# Patient Record
Sex: Male | Born: 1960 | Race: Black or African American | Hispanic: No | Marital: Married | Smoking: Never smoker
Health system: Southern US, Community
[De-identification: ages and names within clinical notes are randomized; demographics above are authoritative.]

## PROBLEM LIST (undated history)

## (undated) DIAGNOSIS — H53002 Unspecified amblyopia, left eye: Secondary | ICD-10-CM

## (undated) DIAGNOSIS — M1990 Unspecified osteoarthritis, unspecified site: Secondary | ICD-10-CM

## (undated) DIAGNOSIS — I10 Essential (primary) hypertension: Secondary | ICD-10-CM

## (undated) DIAGNOSIS — K219 Gastro-esophageal reflux disease without esophagitis: Secondary | ICD-10-CM

## (undated) DIAGNOSIS — E785 Hyperlipidemia, unspecified: Secondary | ICD-10-CM

## (undated) DIAGNOSIS — E119 Type 2 diabetes mellitus without complications: Secondary | ICD-10-CM

## (undated) HISTORY — DX: Type 2 diabetes mellitus without complications: E11.9

## (undated) HISTORY — DX: Gastro-esophageal reflux disease without esophagitis: K21.9

## (undated) HISTORY — DX: Essential (primary) hypertension: I10

## (undated) HISTORY — DX: Hyperlipidemia, unspecified: E78.5

---

## 2006-02-01 DIAGNOSIS — J309 Allergic rhinitis, unspecified: Secondary | ICD-10-CM | POA: Insufficient documentation

## 2007-01-13 ENCOUNTER — Ambulatory Visit: Payer: Self-pay | Admitting: Family Medicine

## 2008-08-22 ENCOUNTER — Emergency Department: Payer: Self-pay | Admitting: Emergency Medicine

## 2013-03-31 DIAGNOSIS — Z8614 Personal history of Methicillin resistant Staphylococcus aureus infection: Secondary | ICD-10-CM | POA: Insufficient documentation

## 2014-04-22 LAB — PSA: PSA: 1.4

## 2014-05-20 ENCOUNTER — Ambulatory Visit: Payer: Self-pay | Admitting: Gastroenterology

## 2014-05-24 LAB — HM COLONOSCOPY: HM Colonoscopy: 1

## 2015-04-08 DIAGNOSIS — R1013 Epigastric pain: Secondary | ICD-10-CM | POA: Insufficient documentation

## 2015-04-08 DIAGNOSIS — F411 Generalized anxiety disorder: Secondary | ICD-10-CM | POA: Insufficient documentation

## 2015-04-08 DIAGNOSIS — L739 Follicular disorder, unspecified: Secondary | ICD-10-CM | POA: Insufficient documentation

## 2015-04-08 DIAGNOSIS — K130 Diseases of lips: Secondary | ICD-10-CM | POA: Insufficient documentation

## 2015-04-08 DIAGNOSIS — Z202 Contact with and (suspected) exposure to infections with a predominantly sexual mode of transmission: Secondary | ICD-10-CM | POA: Insufficient documentation

## 2015-04-08 DIAGNOSIS — IMO0002 Reserved for concepts with insufficient information to code with codable children: Secondary | ICD-10-CM | POA: Insufficient documentation

## 2015-04-08 DIAGNOSIS — E1165 Type 2 diabetes mellitus with hyperglycemia: Secondary | ICD-10-CM | POA: Insufficient documentation

## 2015-05-11 DIAGNOSIS — E1165 Type 2 diabetes mellitus with hyperglycemia: Secondary | ICD-10-CM | POA: Insufficient documentation

## 2015-05-12 ENCOUNTER — Ambulatory Visit: Payer: Self-pay | Admitting: Family Medicine

## 2015-06-02 ENCOUNTER — Other Ambulatory Visit: Payer: Self-pay

## 2015-06-02 ENCOUNTER — Telehealth: Payer: Self-pay | Admitting: Gastroenterology

## 2015-06-02 NOTE — Telephone Encounter (Signed)
Pt showed up on recall list, he needs 1 year repeat from polyps found last year 7/2. Pt is now scheduled for 9/[facility] at [facility]

## 2015-06-08 ENCOUNTER — Other Ambulatory Visit: Payer: Self-pay | Admitting: Family Medicine

## 2015-06-09 ENCOUNTER — Ambulatory Visit (INDEPENDENT_AMBULATORY_CARE_PROVIDER_SITE_OTHER): Payer: BLUE CROSS/BLUE SHIELD | Admitting: Family Medicine

## 2015-06-09 ENCOUNTER — Encounter: Payer: Self-pay | Admitting: Family Medicine

## 2015-06-09 ENCOUNTER — Other Ambulatory Visit: Payer: Self-pay | Admitting: Family Medicine

## 2015-06-09 VITALS — BP 128/78 | HR 76 | Temp 97.8°F | Resp 16 | Ht 71.5 in | Wt 212.0 lb

## 2015-06-09 DIAGNOSIS — E785 Hyperlipidemia, unspecified: Secondary | ICD-10-CM | POA: Diagnosis not present

## 2015-06-09 DIAGNOSIS — I10 Essential (primary) hypertension: Secondary | ICD-10-CM | POA: Diagnosis not present

## 2015-06-09 DIAGNOSIS — E119 Type 2 diabetes mellitus without complications: Secondary | ICD-10-CM

## 2015-06-09 LAB — POCT GLYCOSYLATED HEMOGLOBIN (HGB A1C): Hemoglobin A1C: 7.1

## 2015-06-09 MED ORDER — LISINOPRIL 20 MG PO TABS: 20.0000 mg | ORAL_TABLET | Freq: Every day | ORAL | Status: DC

## 2015-06-09 NOTE — Progress Notes (Signed)
Subjective:     Patient ID: Connor [name], male   DOB: 03-22-1961, 54 y.o.   MRN: [id number]  HPI  Chief Complaint  Patient presents with  . Diabetes    patient comes in office today for follow up, last office visit was 03/07/15, advised to continue 3 drug regimen. HgbA1C in house 7.7%  . Hyperlipidemia    follow up visit from 03/07/15 patient advised to continue Atorvastatin and labs will be checked at next office visit  . Hypertension    follow up from 03/07/15, continue current medication b/p in office 128/82  States he is participating in another diabetes clinical trial in [facility]. They obtain his labs regularly and he believes they have him on an experimental drug. Reports he is feeling well but has been taking lisinopril twice daily with his metformin. I have suggested once daily is enough.   Review of Systems  Respiratory: Negative for shortness of breath.   Cardiovascular: Negative for chest pain and palpitations.  Genitourinary:       No nocturia       Objective:   Physical Exam  Constitutional: He appears well-developed and well-nourished. No distress.  Cardiovascular: Normal rate and regular rhythm.   Pulmonary/Chest: Breath sounds normal.  Musculoskeletal: He exhibits no edema (of lower extremities).       Assessment:     1. Type 2 diabetes mellitus without complication-improved - POCT glycosylated hemoglobin (Hb A1C)  2. HLD (hyperlipidemia)-continue atorvastatin  3. Essential hypertension-stable    Plan:    Continue with clinical trial. F/u in 6 months.

## 2015-06-09 NOTE — Patient Instructions (Signed)
Continue with current medications. Please get copy of your labs from your clinical study when available.

## 2015-06-13 ENCOUNTER — Ambulatory Visit: Payer: Self-pay | Admitting: Family Medicine

## 2015-08-25 ENCOUNTER — Other Ambulatory Visit: Payer: Self-pay | Admitting: Family Medicine

## 2015-08-26 ENCOUNTER — Telehealth: Payer: Self-pay

## 2015-08-26 NOTE — Telephone Encounter (Signed)
Gastroenterology Pre-Procedure Review  Request Date: 08/30/15 Requesting Physician: Anola Gurney, PA  PATIENT REVIEW QUESTIONS: The patient responded to the following health history questions as indicated:    1. Are you having any GI issues? no 2. Do you have a personal history of Polyps? yes (repeat 1 year) 3. Do you have a family history of Colon Cancer or Polyps? no 4. Diabetes Mellitus? yes (Type 2) 5. Joint replacements in the past 12 months?no 6. Major health problems in the past 3 months?no 7. Any artificial heart valves, MVP, or defibrillator?no    MEDICATIONS & ALLERGIES:    Patient reports the following regarding taking any anticoagulation/antiplatelet therapy:   Plavix, Coumadin, Eliquis, Xarelto, Lovenox, Pradaxa, Brilinta, or Effient? no Aspirin? no  Patient confirms/reports the following medications:  Current Outpatient Prescriptions  Medication Sig Dispense Refill  . atorvastatin (LIPITOR) 40 MG tablet Take by mouth daily.    Marland Kitchen glipiZIDE (GLUCOTROL XL) 10 MG 24 hr tablet Take by mouth daily.    . hydrochlorothiazide (HYDRODIURIL) 25 MG tablet Take by mouth daily.    Marland Kitchen lisinopril (PRINIVIL,ZESTRIL) 20 MG tablet Take 1 tablet (20 mg total) by mouth daily. 90 tablet 3  . metFORMIN (GLUCOPHAGE) 1000 MG tablet TAKE 1 TABLET TWICE A DAY 180 tablet 0  . omeprazole (PRILOSEC) 40 MG capsule TAKE 1 CAPSULE DAILY 90 capsule 2  . pioglitazone (ACTOS) 15 MG tablet Take by mouth daily.    . sildenafil (VIAGRA) 100 MG tablet Take by mouth as needed.    . tadalafil (CIALIS) 20 MG tablet Take by mouth as needed.     No current facility-administered medications for this visit.    Patient confirms/reports the following allergies:  No Known Allergies  No orders of the defined types were placed in this encounter.    AUTHORIZATION INFORMATION Primary Insurance: 1D#: Group #:  Secondary Insurance: 1D#: Group #:  SCHEDULE INFORMATION: Date: 08/30/15 Time: Location:  [facility]

## 2015-08-30 ENCOUNTER — Ambulatory Visit: Payer: BLUE CROSS/BLUE SHIELD | Admitting: Anesthesiology

## 2015-08-30 ENCOUNTER — Encounter
Admission: RE | Disposition: A | Discharge status: Home / Self Care | Payer: Self-pay | Source: Ambulatory Visit | Attending: Gastroenterology

## 2015-08-30 ENCOUNTER — Ambulatory Visit
Admission: RE | Admit: 2015-08-30 | Discharge: 2015-08-30 | Disposition: A | Discharge status: Home / Self Care | Payer: BLUE CROSS/BLUE SHIELD | Source: Ambulatory Visit | Attending: Gastroenterology | Admitting: Gastroenterology

## 2015-08-30 ENCOUNTER — Encounter: Payer: Self-pay | Admitting: *Deleted

## 2015-08-30 DIAGNOSIS — E119 Type 2 diabetes mellitus without complications: Secondary | ICD-10-CM | POA: Insufficient documentation

## 2015-08-30 DIAGNOSIS — Z79899 Other long term (current) drug therapy: Secondary | ICD-10-CM | POA: Insufficient documentation

## 2015-08-30 DIAGNOSIS — K219 Gastro-esophageal reflux disease without esophagitis: Secondary | ICD-10-CM | POA: Diagnosis not present

## 2015-08-30 DIAGNOSIS — Z1211 Encounter for screening for malignant neoplasm of colon: Secondary | ICD-10-CM | POA: Insufficient documentation

## 2015-08-30 DIAGNOSIS — Z86010 Personal history of colonic polyps: Secondary | ICD-10-CM | POA: Diagnosis not present

## 2015-08-30 DIAGNOSIS — K640 First degree hemorrhoids: Secondary | ICD-10-CM | POA: Diagnosis not present

## 2015-08-30 DIAGNOSIS — Z7984 Long term (current) use of oral hypoglycemic drugs: Secondary | ICD-10-CM | POA: Insufficient documentation

## 2015-08-30 DIAGNOSIS — I10 Essential (primary) hypertension: Secondary | ICD-10-CM | POA: Insufficient documentation

## 2015-08-30 DIAGNOSIS — E785 Hyperlipidemia, unspecified: Secondary | ICD-10-CM | POA: Insufficient documentation

## 2015-08-30 HISTORY — PX: COLONOSCOPY WITH PROPOFOL: SHX5780

## 2015-08-30 LAB — GLUCOSE, CAPILLARY: Glucose-Capillary: 140 mg/dL — ABNORMAL HIGH (ref 65–99)

## 2015-08-30 SURGERY — COLONOSCOPY WITH PROPOFOL
Anesthesia: General

## 2015-08-30 MED ORDER — MIDAZOLAM HCL 2 MG/2ML IJ SOLN
INTRAMUSCULAR | Status: DC | PRN
  Administered 2015-08-30: 1 mg via INTRAVENOUS

## 2015-08-30 MED ORDER — PROPOFOL 500 MG/50ML IV EMUL
INTRAVENOUS | Status: DC | PRN
  Administered 2015-08-30: 100 ug/kg/min via INTRAVENOUS

## 2015-08-30 MED ORDER — SODIUM CHLORIDE 0.9 % IV SOLN
INTRAVENOUS | Status: DC
  Administered 2015-08-30 (×2): via INTRAVENOUS

## 2015-08-30 MED ORDER — FENTANYL CITRATE (PF) 100 MCG/2ML IJ SOLN
INTRAMUSCULAR | Status: DC | PRN
  Administered 2015-08-30: 50 ug via INTRAVENOUS

## 2015-08-30 NOTE — H&P (Signed)
  [facility]  [address], [address] [place], [place] [address] Phone: [phone] Fax : [phone]  Primary Care Physician:  Anola Gurney, [place] Primary Gastroenterologist:  Dr. Servando Snare  Pre-Procedure History & Physical: HPI:  Connor [name] is a 54 y.o. male is here for an colonoscopy.   Past Medical History  Diagnosis Date  . Diabetes mellitus without complication (HCC)   . Hyperlipidemia   . Hypertension   . GERD (gastroesophageal reflux disease)     History reviewed. No pertinent past surgical history.  Prior to Admission medications   Medication Sig Start Date End Date Taking? Authorizing Provider  atorvastatin (LIPITOR) 40 MG tablet Take by mouth daily. 12/08/14   Historical Provider, MD  glipiZIDE (GLUCOTROL XL) 10 MG 24 hr tablet Take by mouth daily. 12/08/14   Historical Provider, MD  hydrochlorothiazide (HYDRODIURIL) 25 MG tablet Take by mouth daily. 12/08/14   Historical Provider, MD  lisinopril (PRINIVIL,ZESTRIL) 20 MG tablet Take 1 tablet (20 mg total) by mouth daily. 06/09/15   Anola Gurney, PA  metFORMIN (GLUCOPHAGE) 1000 MG tablet TAKE 1 TABLET TWICE A DAY 08/25/15   Anola Gurney, PA  omeprazole (PRILOSEC) 40 MG capsule TAKE 1 CAPSULE DAILY 06/09/15   Anola Gurney, PA  pioglitazone (ACTOS) 15 MG tablet Take by mouth daily. 12/08/14   Historical Provider, MD  sildenafil (VIAGRA) 100 MG tablet Take by mouth as needed. 07/12/14   Historical Provider, MD  tadalafil (CIALIS) 20 MG tablet Take by mouth as needed. 12/23/13   Historical Provider, MD    Allergies as of 06/02/2015  . (No Known Allergies)    History reviewed. No pertinent family history.  Social History   Social History  . Marital Status: Married    Spouse Name: N/A  . Number of Children: N/A  . Years of Education: N/A   Occupational History  . Not on file.   Social History Main Topics  . Smoking status: Never Smoker   . Smokeless tobacco: Never Used  . Alcohol Use: No  . Drug  Use: No  . Sexual Activity: Not on file   Other Topics Concern  . Not on file   Social History Narrative    Review of Systems: See HPI, otherwise negative ROS  Physical Exam: BP 146/96 mmHg  Pulse 73  Temp(Src) 96.2 F (35.7 C) (Tympanic)  Resp 18  Ht  (1.854 m)  Wt 213 lb (96.616 kg)  BMI 28.11 kg/m2  SpO2 100% General:   Alert,  pleasant and cooperative in NAD Head:  Normocephalic and atraumatic. Neck:  Supple; no masses or thyromegaly. Lungs:  Clear throughout to auscultation.    Heart:  Regular rate and rhythm. Abdomen:  Soft, nontender and nondistended. Normal bowel sounds, without guarding, and without rebound.   Neurologic:  Alert and  oriented x4;  grossly normal neurologically.  Impression/Plan: Connor [name] is here for an colonoscopy to be performed for history of colon polyps.  Risks, benefits, limitations, and alternatives regarding  colonoscopy have been reviewed with the patient.  Questions have been answered.  All parties agreeable.   Darlina Rumpf, MD  08/30/2015, 8:09 AM

## 2015-08-30 NOTE — Transfer of Care (Signed)
2Immediate Anesthesia Transfer of Care Note  Patient: Connor [name]  Procedure(s) Performed: Procedure(s): COLONOSCOPY WITH PROPOFOL (N/A)  Patient Location: PACU  Anesthesia Type:General  Level of Consciousness: awake, alert  and oriented  Airway & Oxygen Therapy: Patient Spontanous Breathing and Patient connected to nasal cannula oxygen  Post-op Assessment: Report given to RN and Post -op Vital signs reviewed and stable  Post vital signs: Reviewed and stable  Last Vitals:  Filed Vitals:   08/30/15 0837  BP:   Pulse:   Temp: 36.4 C  Resp:     Complications: No apparent anesthesia complications

## 2015-08-30 NOTE — Op Note (Signed)
[facility] Patient Name: Connor [name] Procedure Date: 08/30/2015 7:57 AM MRN: [id number] Account #: [id number] Date of Birth: 1960/11/21 Admit Type: Outpatient Age: 54 Room: [facility] ENDO ROOM 4 Gender: Male Note Status: Finalized Procedure:         Colonoscopy Indications:       High risk colon cancer surveillance: Personal history of                     colonic polyps Providers:         Midge Minium, MD Referring MD:      Demetrios Isaacs. Sherrie Mustache, MD (Referring MD) Medicines:         Propofol per Anesthesia Complications:     No immediate complications. Procedure:         Pre-Anesthesia Assessment:                    - Prior to the procedure, a History and Physical was                     performed, and patient medications and allergies were                     reviewed. The patient's tolerance of previous anesthesia                     was also reviewed. The risks and benefits of the procedure                     and the sedation options and risks were discussed with the                     patient. All questions were answered, and informed consent                     was obtained. Prior Anticoagulants: The patient has taken                     no previous anticoagulant or antiplatelet agents. ASA                     Grade Assessment: II - A patient with mild systemic                     disease. After reviewing the risks and benefits, the                     patient was deemed in satisfactory condition to undergo                     the procedure.                    After obtaining informed consent, the colonoscope was                     passed under direct vision. Throughout the procedure, the                     patient's blood pressure, pulse, and oxygen saturations                     were monitored continuously. The Colonoscope was  introduced through the anus and advanced to the the cecum,                     identified by  appendiceal orifice and ileocecal valve. The                     colonoscopy was performed without difficulty. The patient                     tolerated the procedure well. The quality of the bowel                     preparation was good. Findings:      The perianal and digital rectal examinations were normal.      Non-bleeding internal hemorrhoids were found during retroflexion. The       hemorrhoids were Grade I (internal hemorrhoids that do not prolapse). Impression:        - Non-bleeding internal hemorrhoids.                    - No specimens collected. Recommendation:    - Repeat colonoscopy in 4 years for surveillance. Procedure Code(s): --- Professional ---                    [phone], Colonoscopy, flexible; diagnostic, including                     collection of specimen(s) by brushing or washing, when                     performed (separate procedure) Diagnosis Code(s): --- Professional ---                    Z86.010, Personal history of colonic polyps CPT copyright 2014 American Medical Association. All rights reserved. The codes documented in this report are preliminary and upon coder review may  be revised to meet current compliance requirements. Midge Minium, MD 08/30/2015 8:32:38 AM This report has been signed electronically. Number of Addenda: 0 Note Initiated On: 08/30/2015 7:57 AM Scope Withdrawal Time: 0 hours 8 minutes 17 seconds  Total Procedure Duration: 0 hours 11 minutes 2 seconds       [facility]

## 2015-08-30 NOTE — Anesthesia Postprocedure Evaluation (Signed)
  Anesthesia Post-op Note  Patient: Connor [name]  Procedure(s) Performed: Procedure(s): COLONOSCOPY WITH PROPOFOL (N/A)  Anesthesia type:General  Patient location: PACU  Post pain: Pain level controlled  Post assessment: Post-op Vital signs reviewed, Patient's Cardiovascular Status Stable, Respiratory Function Stable, Patent Airway and No signs of Nausea or vomiting  Post vital signs: Reviewed and stable  Last Vitals:  Filed Vitals:   08/30/15 0910  BP: 134/99  Pulse: 64  Temp:   Resp: 18    Level of consciousness: awake, alert  and patient cooperative  Complications: No apparent anesthesia complications

## 2015-08-30 NOTE — Anesthesia Preprocedure Evaluation (Signed)
Anesthesia Evaluation  Patient identified by MRN, date of birth, ID band Patient awake    Reviewed: Allergy & Precautions, H&P , NPO status , Patient's Chart, lab work & pertinent test results  History of Anesthesia Complications Negative for: history of anesthetic complications  Airway Mallampati: III  TM Distance: >3 FB Neck ROM: limited    Dental  (+) Teeth Intact, Caps   Pulmonary neg pulmonary ROS, neg shortness of breath,    Pulmonary exam normal breath sounds clear to auscultation       Cardiovascular Exercise Tolerance: Good hypertension, (-) Past MI Normal cardiovascular exam Rhythm:regular Rate:Normal     Neuro/Psych PSYCHIATRIC DISORDERS negative neurological ROS     GI/Hepatic Neg liver ROS, GERD  Controlled,  Endo/Other  diabetes, Type 2  Renal/GU negative Renal ROS  negative genitourinary   Musculoskeletal   Abdominal   Peds  Hematology negative hematology ROS (+)   Anesthesia Other Findings Past Medical History:   Diabetes mellitus without complication (HCC)                 Hyperlipidemia                                               Hypertension                                                 GERD (gastroesophageal reflux disease)                      BMI    Body Mass Index   28.10 kg/m 2      Reproductive/Obstetrics negative OB ROS                             Anesthesia Physical Anesthesia Plan  ASA: III  Anesthesia Plan: General   Post-op Pain Management:    Induction:   Airway Management Planned:   Additional Equipment:   Intra-op Plan:   Post-operative Plan:   Informed Consent: I have reviewed the patients History and Physical, chart, labs and discussed the procedure including the risks, benefits and alternatives for the proposed anesthesia with the patient or authorized representative who has indicated his/her understanding and acceptance.    Dental Advisory Given  Plan Discussed with: Anesthesiologist, CRNA and Surgeon  Anesthesia Plan Comments:         Anesthesia Quick Evaluation

## 2015-09-07 ENCOUNTER — Encounter: Payer: Self-pay | Admitting: Gastroenterology

## 2015-10-06 ENCOUNTER — Ambulatory Visit (INDEPENDENT_AMBULATORY_CARE_PROVIDER_SITE_OTHER): Payer: BLUE CROSS/BLUE SHIELD | Admitting: Family Medicine

## 2015-10-06 ENCOUNTER — Encounter: Payer: Self-pay | Admitting: Family Medicine

## 2015-10-06 VITALS — BP 110/84 | HR 71 | Temp 98.2°F | Resp 16 | Wt 215.6 lb

## 2015-10-06 DIAGNOSIS — I10 Essential (primary) hypertension: Secondary | ICD-10-CM | POA: Diagnosis not present

## 2015-10-06 DIAGNOSIS — E119 Type 2 diabetes mellitus without complications: Secondary | ICD-10-CM | POA: Diagnosis not present

## 2015-10-06 LAB — POCT GLYCOSYLATED HEMOGLOBIN (HGB A1C): HEMOGLOBIN A1C: 7.9

## 2015-10-06 NOTE — Patient Instructions (Signed)
Continue Lisinopril once daily. Resume glipizide daily. Take metformin with a meal twice daily.

## 2015-10-06 NOTE — Progress Notes (Signed)
Subjective:     Patient ID: Connor [name], male   DOB: 12/08/1960, 54 y.o.   MRN: [id number]  HPI  Chief Complaint  Patient presents with  . Diabetes    Patient is present for diabetes follow up last office visit was 06/09/15, HgbA1C was 7.1%, patient reports good compliace and tolerance. He reports that he is checking his feet for wounds and sores and denies hypoglycemia incident  . Hyperlipidemia    Follow up from 06/09/15, condition stable advised to continue Atorvastatin  . Hypertension    Follow up from 06/09/15,stable blood pressure at visit was 128/78. Patient states that he has concerns about his blood pressure being elevated at 100/100  States he has been trying to minimize use of medication. He has only been taking metformin but takes his lisinopril up to twice daily instead of once daily. No longer taking Actos or HCTZ. Continues to participate in diabetes clinical trials with labs and rechecks monthly.   Review of Systems     Objective:   Physical Exam  Constitutional: He appears well-developed and well-nourished. No distress.  Cardiovascular: Normal rate and regular rhythm.   Pulmonary/Chest: Breath sounds normal.  Musculoskeletal: He exhibits no edema (of lower extremities).       Assessment:    1. Diabetes mellitus without complication (HCC): worsening control due to medication non-compliance. - POCT glycosylated hemoglobin (Hb A1C)  2. Essential hypertension: stable    Plan:    Discussed taking metformin with meals twice daily, resuming glipizide, and taking lisinopril only once daily.

## 2015-10-19 ENCOUNTER — Other Ambulatory Visit: Payer: Self-pay | Admitting: Family Medicine

## 2015-10-19 ENCOUNTER — Telehealth: Payer: Self-pay

## 2015-10-19 ENCOUNTER — Encounter: Payer: Self-pay | Admitting: Family Medicine

## 2015-10-19 ENCOUNTER — Ambulatory Visit
Admission: RE | Admit: 2015-10-19 | Discharge: 2015-10-19 | Disposition: A | Discharge status: Home / Self Care | Payer: BLUE CROSS/BLUE SHIELD | Source: Ambulatory Visit | Attending: Family Medicine | Admitting: Family Medicine

## 2015-10-19 ENCOUNTER — Ambulatory Visit (INDEPENDENT_AMBULATORY_CARE_PROVIDER_SITE_OTHER): Payer: BLUE CROSS/BLUE SHIELD | Admitting: Family Medicine

## 2015-10-19 VITALS — BP 130/82 | HR 86 | Temp 97.6°F | Resp 16 | Wt 214.4 lb

## 2015-10-19 DIAGNOSIS — R29898 Other symptoms and signs involving the musculoskeletal system: Secondary | ICD-10-CM | POA: Diagnosis present

## 2015-10-19 DIAGNOSIS — M62838 Other muscle spasm: Secondary | ICD-10-CM

## 2015-10-19 DIAGNOSIS — M6249 Contracture of muscle, multiple sites: Secondary | ICD-10-CM

## 2015-10-19 MED ORDER — CYCLOBENZAPRINE HCL 5 MG PO TABS: 5.0000 mg | ORAL_TABLET | Freq: Three times a day (TID) | ORAL | Status: DC | PRN

## 2015-10-19 NOTE — Telephone Encounter (Signed)
Pt advised as directed below.   Thanks,   -Charels Stambaugh  

## 2015-10-19 NOTE — Patient Instructions (Signed)
We will call you with the x-ray report 

## 2015-10-19 NOTE — Progress Notes (Signed)
Subjective:     Patient ID: Connor [name], male   DOB: 03/29/1961, 54 y.o.   MRN: [id number]  HPI  Chief Complaint  Patient presents with  . Spasms    Patient comes in office today to discuss muscle spasms that he has been having for the past 2 months. Patient states that he has spasms in his right arm and has difficulty lifting arm above head.   He is in a clinical diabetes study in [facility] and labs/medications are being monitored. Was told one of his labs was high and needed to come down. States he also has had right shoulder weakness for > one year in the absence of injury. States the spasms affect his right arm> left and right upper chest wall at times. Wishes medication for the spasms. Current job with [employer] does not involve lifting.   Review of Systems  Neurological:       No hx of CVA       Objective:   Physical Exam  Constitutional: He appears well-developed and well-nourished. No distress.  Musculoskeletal:  Right bicep spasms present during office visit. M.S.: EF/EE 5/5, right grip 5/5, right shoulder abduction 2/5 with 5/5 strength IR/ER/F/E. Full range passively but active range limited by abduction weakness.       Assessment:    1. Shoulder weakness; ? Musculoskeletal ? Nerve injury - DG Shoulder Right; Future  2. Night muscle spasms - cyclobenzaprine (FLEXERIL) 5 MG tablet; Take 1 tablet (5 mg total) by mouth 3 (three) times daily as needed for muscle spasms.  Dispense: 30 tablet; Refill 0    Plan:    Probable orthopedic referral

## 2015-10-19 NOTE — Telephone Encounter (Signed)
Pt returned call and request to be called back on his cell phone # above. Thanks TNP

## 2015-10-19 NOTE — Telephone Encounter (Signed)
[id number] 

## 2015-10-19 NOTE — Telephone Encounter (Signed)
-----   Message from Anola Gurneyobert Chauvin, [place] sent at 10/19/2015 10:26 AM EST ----- Mild arthritic changes. I will start the orthopedic referral.

## 2015-11-01 ENCOUNTER — Other Ambulatory Visit: Payer: Self-pay | Admitting: Specialist

## 2015-11-01 ENCOUNTER — Telehealth: Payer: Self-pay | Admitting: Family Medicine

## 2015-11-01 DIAGNOSIS — M5412 Radiculopathy, cervical region: Secondary | ICD-10-CM

## 2015-11-01 NOTE — Telephone Encounter (Signed)
Let's see what the MRI shows.

## 2015-11-01 NOTE — Telephone Encounter (Signed)
Please review. KW 

## 2015-11-01 NOTE — Telephone Encounter (Signed)
[id number] 

## 2015-11-01 NOTE — Telephone Encounter (Signed)
Patient states that Dr. Hyacinth MeekerMiller reported that patient has a pinched nerve in his neck and that there is nothing wrong with the shoulder so according to Mr. Marcelle OverlieHolland the specialist said that pinched nerve is not in his field and therefore is referring patient back to his PCP. Please advise. KW

## 2015-11-01 NOTE — Telephone Encounter (Signed)
Pt stated that he is having an MRI on 11/03/15 and wanted to get an appt to go over the results. Pt stated that Dr. Hyacinth MeekerMiller ordered the MRI. Pt stated that Dr. Hyacinth MeekerMiller advised pt that he might have to get the results from our office. I wasn't sure if we could do that since we didn't order the test. Please advise. Thanks TNP

## 2015-11-01 NOTE — Telephone Encounter (Signed)
I will be able to see the MRI results in the electronic record but Dr. Hyacinth MeekerMiller should review these with you and make further recommendations for treatment.

## 2015-11-02 NOTE — Telephone Encounter (Signed)
Attempted to contact patient no answer and voicemail is full

## 2015-11-03 ENCOUNTER — Ambulatory Visit
Admission: RE | Admit: 2015-11-03 | Discharge: 2015-11-03 | Disposition: A | Discharge status: Home / Self Care | Payer: BLUE CROSS/BLUE SHIELD | Source: Ambulatory Visit | Attending: Specialist | Admitting: Specialist

## 2015-11-03 DIAGNOSIS — M4802 Spinal stenosis, cervical region: Secondary | ICD-10-CM | POA: Insufficient documentation

## 2015-11-03 DIAGNOSIS — M5412 Radiculopathy, cervical region: Secondary | ICD-10-CM | POA: Diagnosis present

## 2015-11-03 DIAGNOSIS — R531 Weakness: Secondary | ICD-10-CM | POA: Diagnosis not present

## 2015-11-03 NOTE — Telephone Encounter (Signed)
Patient has been advised. KW 

## 2015-11-07 ENCOUNTER — Other Ambulatory Visit: Payer: Self-pay | Admitting: Family Medicine

## 2015-11-07 ENCOUNTER — Telehealth: Payer: Self-pay | Admitting: Family Medicine

## 2015-11-07 ENCOUNTER — Telehealth: Payer: Self-pay

## 2015-11-07 DIAGNOSIS — M4802 Spinal stenosis, cervical region: Secondary | ICD-10-CM

## 2015-11-07 DIAGNOSIS — R29898 Other symptoms and signs involving the musculoskeletal system: Secondary | ICD-10-CM

## 2015-11-07 MED ORDER — MELOXICAM 15 MG PO TABS: 15.0000 mg | ORAL_TABLET | Freq: Every day | ORAL | Status: DC

## 2015-11-07 NOTE — Telephone Encounter (Signed)
Pt request a call back on his cell #.  ZO#[phone]/WJCB#[phone]/MW

## 2015-11-07 NOTE — Telephone Encounter (Signed)
Left patient detailed message instructing as below. KW

## 2015-11-07 NOTE — Telephone Encounter (Signed)
Have sent in meloxicam to try. Do not take with Aleve or Advil or similar. May also take Tylenol up to  3000 mg. Daily.

## 2015-11-07 NOTE — Telephone Encounter (Signed)
Spoke with patient on phone who states that he did take the Prednisone but it did nothing for him he stated and wants to know if a medication can be called in to treat arthritis?

## 2015-11-07 NOTE — Telephone Encounter (Signed)
Pleas advise, I sent you message earlier because he also wanted to know about MRI results and next steps he needs to take.

## 2015-11-07 NOTE — Telephone Encounter (Signed)
Patient called office to inquire if you have reviewed over his MRI report yet and what are the next steps he should take? KW

## 2015-11-07 NOTE — Telephone Encounter (Signed)
Pt stated he was returning Bob's call and request to return his call on mobil number. Thanks TNP

## 2015-11-07 NOTE — Telephone Encounter (Signed)
Pt called back wanting to know if you can give him something for pain for the arthritis that you discussed with him earlier.  He uses [facility] on [address]   His call back is [phone]  Thanks, Barth Kirkseri

## 2015-11-07 NOTE — Telephone Encounter (Signed)
Did he try the prednisone that Dr. Hyacinth MeekerMiller gave him. If so, did it help any?

## 2015-11-07 NOTE — Telephone Encounter (Signed)
[id number] 

## 2015-11-27 ENCOUNTER — Other Ambulatory Visit: Payer: Self-pay | Admitting: Family Medicine

## 2015-12-02 ENCOUNTER — Telehealth: Payer: Self-pay | Admitting: Family Medicine

## 2015-12-02 NOTE — Telephone Encounter (Signed)
Called patient back he states that his prescription for Meloxicam has not helped. Patient states that he has noticed no improvement and wants to start getting his strength back, he request that a new Rx be called into pharmacy. Please advise. KW

## 2015-12-02 NOTE — Telephone Encounter (Signed)
Pt request that Connor [name] return his call as soon as possible and wouldn't leave details to what he needed. Thanks TNP

## 2015-12-02 NOTE — Telephone Encounter (Signed)
Patient states appt has changed  due to his schedule and its mid Feb

## 2015-12-02 NOTE — Telephone Encounter (Signed)
Did he see the neurologist, Dr. Sherryll BurgerShah, on January 11? He needs the expertise of a specialist to further diagnosis and treat his shoulder weakness.

## 2015-12-19 ENCOUNTER — Other Ambulatory Visit: Payer: Self-pay | Admitting: Family Medicine

## 2015-12-19 DIAGNOSIS — M62838 Other muscle spasm: Secondary | ICD-10-CM

## 2015-12-19 MED ORDER — CYCLOBENZAPRINE HCL 5 MG PO TABS: 5.0000 mg | ORAL_TABLET | Freq: Three times a day (TID) | ORAL | Status: DC | PRN

## 2016-01-04 ENCOUNTER — Other Ambulatory Visit: Payer: Self-pay | Admitting: Neurology

## 2016-01-04 DIAGNOSIS — Z86010 Personal history of colonic polyps: Secondary | ICD-10-CM | POA: Insufficient documentation

## 2016-01-04 DIAGNOSIS — R1310 Dysphagia, unspecified: Secondary | ICD-10-CM

## 2016-01-04 DIAGNOSIS — R29898 Other symptoms and signs involving the musculoskeletal system: Secondary | ICD-10-CM

## 2016-01-11 ENCOUNTER — Ambulatory Visit: Payer: BLUE CROSS/BLUE SHIELD | Attending: Physical Therapy | Admitting: Physical Therapy

## 2016-01-13 ENCOUNTER — Other Ambulatory Visit: Payer: Self-pay | Admitting: Family Medicine

## 2016-01-24 ENCOUNTER — Ambulatory Visit
Admission: RE | Admit: 2016-01-24 | Discharge: 2016-01-24 | Disposition: A | Discharge status: Home / Self Care | Payer: BLUE CROSS/BLUE SHIELD | Source: Ambulatory Visit | Attending: Neurology | Admitting: Neurology

## 2016-01-24 DIAGNOSIS — R29898 Other symptoms and signs involving the musculoskeletal system: Secondary | ICD-10-CM

## 2016-01-24 DIAGNOSIS — R253 Fasciculation: Secondary | ICD-10-CM | POA: Diagnosis not present

## 2016-01-24 DIAGNOSIS — R9089 Other abnormal findings on diagnostic imaging of central nervous system: Secondary | ICD-10-CM | POA: Insufficient documentation

## 2016-01-24 DIAGNOSIS — R1310 Dysphagia, unspecified: Secondary | ICD-10-CM | POA: Diagnosis present

## 2016-01-24 MED ORDER — GADOBENATE DIMEGLUMINE 529 MG/ML IV SOLN
20.0000 mL | Freq: Once | INTRAVENOUS | Status: AC | PRN
  Administered 2016-01-24: 20 mL via INTRAVENOUS

## 2016-01-31 ENCOUNTER — Other Ambulatory Visit: Payer: Self-pay | Admitting: Family Medicine

## 2016-01-31 DIAGNOSIS — M4802 Spinal stenosis, cervical region: Secondary | ICD-10-CM

## 2016-01-31 DIAGNOSIS — M62838 Other muscle spasm: Secondary | ICD-10-CM

## 2016-01-31 MED ORDER — CYCLOBENZAPRINE HCL 5 MG PO TABS: 5.0000 mg | ORAL_TABLET | Freq: Three times a day (TID) | ORAL | Status: DC | PRN

## 2016-01-31 MED ORDER — MELOXICAM 15 MG PO TABS: 15.0000 mg | ORAL_TABLET | Freq: Every day | ORAL | Status: DC

## 2016-02-09 ENCOUNTER — Ambulatory Visit: Payer: BLUE CROSS/BLUE SHIELD | Attending: Neurology | Admitting: Physical Therapy

## 2016-02-09 ENCOUNTER — Encounter: Payer: Self-pay | Admitting: Physical Therapy

## 2016-02-09 DIAGNOSIS — R29898 Other symptoms and signs involving the musculoskeletal system: Secondary | ICD-10-CM | POA: Diagnosis present

## 2016-02-09 DIAGNOSIS — R278 Other lack of coordination: Secondary | ICD-10-CM | POA: Insufficient documentation

## 2016-02-09 DIAGNOSIS — M436 Torticollis: Secondary | ICD-10-CM | POA: Diagnosis present

## 2016-02-09 NOTE — Therapy (Signed)
[place] [facility] MAIN [facility] SERVICES [address] [place], [place], [address] Phone: [phone]   Fax:  [phone]  Physical Therapy Evaluation  Patient Details  Name: ACIE CUSTIS MRN: [id number] Date of Birth: January 02, 1961 Referring Provider: Dr. Cristopher Peru  Encounter Date: 02/09/2016      PT End of Session - 02/09/16 1019    Visit Number 1   Number of Visits 17   Date for PT Re-Evaluation 03/22/16   PT Start Time 0805   PT Stop Time 0850   PT Time Calculation (min) 45 min   Activity Tolerance Patient tolerated treatment well   Behavior During Therapy [facility] for tasks assessed/performed      Past Medical History  Diagnosis Date  . Hyperlipidemia   . GERD (gastroesophageal reflux disease)   . Diabetes mellitus without complication (HCC)     controlled  . Hypertension     controlled    Past Surgical History  Procedure Laterality Date  . Colonoscopy with propofol N/A 08/30/2015    Procedure: COLONOSCOPY WITH PROPOFOL;  Surgeon: Midge Minium, MD;  Location: [facility] ENDOSCOPY;  Service: Endoscopy;  Laterality: N/A;    There were no vitals filed for this visit.  Visit Diagnosis:  Neck stiffness - Plan: PT plan of care cert/re-cert  Weakness of both upper extremities - Plan: PT plan of care cert/re-cert  Other lack of coordination - Plan: PT plan of care cert/re-cert      Subjective Assessment - 02/09/16 0812    Subjective 55 yo Male reports increased RUE weakness for approximately 4-5 months. He denies any numbness/tingling. He reports having increased neck pain. He reports insidious onset of neck pain and started having RUE weakness. Has trouble with getting upper body dressed, particularly with sweaters. Has no dfifficulty with fine motor tasks. 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 NA   How long can you stand comfortably? hours   How long can you walk comfortably? 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 Yes   Pain Score 1    Pain Location Neck   Pain Descriptors / Indicators Tightness;Aching  stiffness   Pain Type Chronic pain   Pain Radiating Towards right shoulder;    Pain Onset More than a month ago   Pain Frequency Constant   Aggravating Factors  none   Pain Relieving Factors nothing; hasn't tried heat/ice;    Multiple Pain Sites No            [facility] PT Assessment - 02/09/16 0001    Assessment   Medical Diagnosis shoulder weakness   Referring Provider Dr. Cristopher Peru   Onset Date/Surgical Date 09/20/15   Hand Dominance Right   Next MD Visit Apr 02, 2016   Prior Therapy denies any PT for this condition;    Precautions   Precautions None   Restrictions   Weight Bearing Restrictions No   Balance Screen   Has the patient fallen in the past 6 months No   Has the patient had a decrease in activity level  because of a fear of falling?  No   Is the patient reluctant to leave their home because of a fear of falling?  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   Vocation Requirements lifting 20 pounds   Leisure go to [employer]/shop; find a good deal; somewhat a sports fan;    Cognition   Overall Cognitive Status Within Functional Limits for tasks assessed   Observation/Other Assessments   Quick DASH  36.36% (the lower the  score the minimal disability)   Sensation   Light Touch Appears Intact   Additional Comments no numbness/tingling noted throughout UE   Coordination   Gross Motor Movements are Fluid and Coordinated No   Fine Motor Movements are Fluid and Coordinated No   Coordination and Movement Description mild dystonic movements due to weakness in shoulders; compensates with shoulder elevation for shoulder flexion movement;    Finger Nose Finger Test accurate bilaterally but slow with increased ataxia;    Posture/Postural Control   Posture Comments demonstrates mild forward head, decreased lumbar lordosis, with increased kyphosis;    AROM   Overall AROM Comments BLE AROM is WFL; able to achieve full shoulder AROM in supine without difficulty or discomfort; AROM measurements taken in sitting;    Right Shoulder Extension 40 Degrees   Right Shoulder Flexion 20 Degrees   Right Shoulder ABduction 5 Degrees   Left Shoulder Extension 40 Degrees   Left Shoulder Flexion 25 Degrees   Left Shoulder ABduction 5 Degrees   Strength   Right Shoulder Flexion 2+/5   Right Shoulder Extension 3-/5   Right Shoulder ABduction 2+/5   Right Shoulder Internal Rotation 4/5   Right Shoulder External Rotation 2+/5   Left Shoulder Flexion 2+/5   Left Shoulder Extension 3-/5   Left Shoulder ABduction 2+/5   Left Shoulder Internal Rotation 4/5   Left Shoulder External Rotation 2+/5   Right Elbow Flexion 3/5   Right Elbow Extension 4-/5   Left Elbow Flexion 3/5   Left Elbow Extension 4-/5   Right Hand Grip (lbs) 80   Left Hand Grip (lbs) 80   Palpation   Palpation comment exhibits tightness in cervical paraspinals, particularly along upper trapezius; denies any tenderness to palpation;    Special Tests    Special Tests Cervical   Cervical Tests Spurling's   Spurling's   Findings Negative   Side --  bilaterally;   Transfers   Comments able to transfer independently;    Ambulation/Gait   Gait Comments able to  ambulate independently with good reciprocal gait pattern;        All cranial nerves present and intact;  TREATMENT:  Initiated HEP: Supine: BUE wand flexion x10 reps with cues to increase elbow extension for strengthening; BUE wand chest press with mod Vcs to slow down UE movement for better coordination/motor control;  Standing: RUE isometric flexion, abduction and extension 3 sec hold x5 each with mod VCs for positioning to improve strengthening;                      PT Education - 02/09/16 1018    Education provided Yes   Education Details HEP initiated, plan of care   Person(s) Educated Patient   Methods Explanation;Verbal  cues   Comprehension Verbalized understanding;Verbal cues required;Returned demonstration             PT Long Term Goals - 02/09/16 1024    PT LONG TERM GOAL #1   Title Patient will be independent in home exercise program to improve strength/mobility for better functional independence with ADLs. by 03/22/16   Time 6   Period Weeks   Status New   PT LONG TERM GOAL #2   Title Patient will increase BUE gross strength to 3/5 in shoulder to improve functional strength for dressing and ADLs by 03/22/16   Time 6   Period Weeks   Status New   PT LONG TERM GOAL #3   Title Patient will increase BUE shoulder AROM to flexion >140 degrees, abduction: >130 degrees in sitting for increased functional ROM for dressing and grooming by 03/22/16   Time 6   Period Weeks   Status New   PT LONG TERM GOAL #4   Title Patient will decrease Quick DASH score by > 8 points demonstrating reduced self-reported upper extremity disability.by 03/22/16   Time 6   Period Weeks   Status New               Plan - 02/09/16 1020    Clinical Impression Statement 55 yo Male reports increased BUE weakness which started approximately 5-6 months ago. He reports insideous onset which just gradually worsened. Patient went to see MD and had X-rays and MRI done which  showed spinal stenosis/DDD without nerve encroachment and small white matter changes in the brain. He has been diagnosed with potential ALS. He exhibits significant weakness in BUE particularly in shoulder flexion/abduction and ER with less weakness distally in wrist/hand. Patient denies any numbness/tingling and only has minimal pain/stiffness in cervical spine. He would benefit from skilled PT Intervention to improve UE strength and reduce functional limitations.    Pt will benefit from skilled therapeutic intervention in order to improve on the following deficits Decreased endurance;Decreased activity tolerance;Decreased strength;Impaired UE functional use;Pain;Increased muscle spasms;Decreased range of motion;Improper body mechanics;Postural dysfunction;Decreased coordination   Rehab Potential Fair   Clinical Impairments Affecting Rehab Potential positive: young in age, motivated; negative: possible chronic progressive condition; Patient's clinical presentation evolving as he has a progressive condition that has no specific timeline;    PT Frequency 2x / week   PT Duration 6 weeks   PT Treatment/Interventions Cryotherapy;ADLs/Self Care Home Management;Electrical Stimulation;Moist Heat;Traction;Therapeutic exercise;Therapeutic activities;Functional mobility training;DME Instruction;Ultrasound;Neuromuscular re-education;Patient/family education;Manual techniques;Energy conservation;Dry needling;Taping;Passive range of motion   PT Next Visit Plan advance strengthening; work on isometrics   PT Home Exercise Plan initiated- see attached;    Consulted and Agree with Plan of Care Patient         Problem List Patient Active Problem List   Diagnosis Date Noted  . Diabetes mellitus without complication (HCC) 10/06/2015  . Hypertension 10/06/2015  . Personal history of colonic polyps   . Personal history of methicillin resistant Staphylococcus aureus 03/31/2013  . HLD (hyperlipidemia) 01/02/2007  .  Allergic rhinitis 02/01/2006  . 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 Acid reflux 06/08/2002    Terrance Lanahan PT, DPT 02/09/2016, 10:29 AM  [place] [facility] MAIN [facility] SERVICES [address] [place], [place], [address] Phone: [phone]   Fax:  [phone]  Name: GRYFFIN ALTICE MRN: [id number] Date of Birth: 06-25-1961

## 2016-02-09 NOTE — Patient Instructions (Signed)
ROM: Flexion - Wand (Supine)    Lie on back holding wand. Raise arms over head.  Repeat __10__ times per set. Do _2___ sets per session. Do _2___ sessions per day.  http://orth.exer.us/928   Copyright  VHI. All rights reserved.  Press-Up With Wand    Press wand up until elbows are straight, DO SLOWLY. Hold _2___ seconds. Repeat 10____ times. Do _2___ sessions per day.  Copyright  VHI. All rights reserved.  Flexion (Isometric)    Press right fist against wall. Hold __3-5__ seconds. Repeat _10___ times. Do __2__ sessions per day.  http://gt2.exer.us/113   Copyright  VHI. All rights reserved.  Abduction (Isometric)    Resist upward motion to the side with other hand on upper arm. Hold __3-5__ seconds. Relax. Repeat __5-10__ times. Do __2__ sessions per day. Activity: Push arm out to side against padded furniture.*  Copyright  VHI. All rights reserved.  Extension (Isometric)    Place left bent elbow and back of arm against wall. Press elbow against wall. Hold __3-5__ seconds. Repeat _5-10___ times. Do __2__ sessions per day.  http://gt2.exer.us/111   Copyright  VHI. All rights reserved.

## 2016-02-15 ENCOUNTER — Encounter: Payer: BLUE CROSS/BLUE SHIELD | Admitting: Physical Therapy

## 2016-02-17 ENCOUNTER — Encounter: Payer: Self-pay | Admitting: Physical Therapy

## 2016-02-17 ENCOUNTER — Other Ambulatory Visit: Payer: Self-pay | Admitting: Neurology

## 2016-02-17 ENCOUNTER — Ambulatory Visit: Payer: BLUE CROSS/BLUE SHIELD | Admitting: Physical Therapy

## 2016-02-17 DIAGNOSIS — R1310 Dysphagia, unspecified: Secondary | ICD-10-CM

## 2016-02-17 DIAGNOSIS — R278 Other lack of coordination: Secondary | ICD-10-CM

## 2016-02-17 DIAGNOSIS — R29898 Other symptoms and signs involving the musculoskeletal system: Secondary | ICD-10-CM

## 2016-02-17 DIAGNOSIS — M436 Torticollis: Secondary | ICD-10-CM

## 2016-02-17 NOTE — Patient Instructions (Addendum)
   Shoulder Retraction   Tie band around door knob (sitting or standing, holding band in both hands) Facing chest height anchor, grasp ends of band and pull hands to chest, squeezing shoulder blades together. Hold _3-5seconds. Repeat _10 times. Do _2_ sessions per day. Safety Note: Be sure anchor is secure.    Copyright  VHI. All rights reserved.   Roll   Inhale and bring shoulders up, back, then exhale and relax shoulders down. Repeat _10__ times. Do _2-3__ times per day.  Copyright  VHI. All rights reserved.    Lat Pull Down   HAVE BAND OVER DOOR: Face anchor with knees slightly flexed. Palms down, pull arms down to sides. Repeat _10_ times per set. Do __1-2 sets per session. Do 5__ sessions per week. Anchor Height: Over Head  http://tub.exer.us/89   Copyright  VHI. All rights reserved.  Marching in Place: Newman PiesBall Throw Down    Don't march in place, but hold ball in both hands, and throw ball down on the floor and then try to catch overhead; Repeat 10 times  Copyright  VHI. All rights reserved.  Ball on Wall: Up Down With Shrug - Standing    Roll ball up wall trying to reach as high as possible, feeling a good stretch. Roll 10___ times, __1-2_ times per day. Roll with right arm active, other arm passive.  http://ss.exer.us/218   Copyright  VHI. All rights reserved.  Neck Stretch (Chair) - Variation 1    One hand holding edge of chair, roll same side shoulder back to open chest. Tip head to other side, chin slightly tucked. Optional: Place other hand above temple for sensitive pressure to deepen stretch. Hold for __15 sec, . Repeat __2-3__ times each side.  Copyright  VHI. All rights reserved.

## 2016-02-17 NOTE — Therapy (Signed)
[place] [facility] MAIN [facility] SERVICES [address] [place], [place], [address] Phone: [phone]   Fax:  [phone]  Physical Therapy Treatment  Patient Details  Name: Connor [name] MRN: [id number] Date of Birth: 03-18-1961 Referring Provider: Dr. Cristopher Peru  Encounter Date: 02/17/2016      PT End of Session - 02/17/16 0852    Visit Number 2   Number of Visits 17   Date for PT Re-Evaluation 03/22/16   PT Start Time 0804   PT Stop Time 0847   PT Time Calculation (min) 43 min   Activity Tolerance Patient tolerated treatment well   Behavior During Therapy [facility] for tasks assessed/performed      Past Medical History  Diagnosis Date  . Hyperlipidemia   . GERD (gastroesophageal reflux disease)   . Diabetes mellitus without complication (HCC)     controlled  . Hypertension     controlled    Past Surgical History  Procedure Laterality Date  . Colonoscopy with propofol N/A 08/30/2015    Procedure: COLONOSCOPY WITH PROPOFOL;  Surgeon: Midge Minium, MD;  Location: [facility] ENDOSCOPY;  Service: Endoscopy;  Laterality: N/A;    There were no vitals filed for this visit.  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 NA   How long can you stand comfortably? hours   How long can you walk comfortably? 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 No/denies    Pain Onset More than a month ago         TREATMENT:  Warm up on Nustep BUE/BLE level 2 x5 min (Unbilled);  HOIST mid row, plate #1 O96, plate #2, E95, required min VCs to increase scapular retraction with exercise for better upper back strengthening;  HOIST lat pull down plate #3 2W41 with min VCs to increase upper arm stretch with eccentric return;   Standing: Blue tband, low row BUE x10 reps with mod VCs to slow down UE movement for better control; Blue tband BUE lat pull down x10 with cues to increase elbow extension for better strengthening; Blue tband, single UE, shoulder extenison with flexion AAROM x10 reps bilaterally;  Shoulder flexion isometric against wall 3 sec hold x10 bilaterally; Shoulder abduction isometric against wall 3 sec x10 bilaterally;  BUE pball bounce and catch working on shoulder flexion x10 reps; BUE ball on wall, up/down x 10 reps with cues to increase shoulder flexion and stretch into flexion;  Posterior shoulder rolls x10 reps; Seated upper trap stretch 15 sec hold x2 bilaterally;  Pball on wall, wall push ups x12 with mod VCs to relax shoulder elevation and increase UE elbow flexion/extension;                          PT Education - 02/17/16 0852    Education provided Yes   Education Details HEP  advanced,    Person(s) Educated Patient   Methods Explanation;Verbal cues   Comprehension Verbalized understanding;Returned demonstration;Verbal cues required             PT Long Term Goals - 02/09/16 1024    PT LONG TERM GOAL #1   Title Patient will be independent in home exercise program to improve strength/mobility for better functional independence with ADLs. by 03/22/16   Time 6   Period Weeks   Status New   PT LONG TERM GOAL #2   Title Patient will increase BUE gross strength to 3/5 in shoulder to improve functional strength for dressing and ADLs by 03/22/16   Time 6   Period Weeks   Status New   PT LONG TERM GOAL #3    Title Patient will increase BUE shoulder AROM to flexion >140 degrees, abduction: >130 degrees in sitting for increased functional ROM for dressing and grooming by 03/22/16   Time 6   Period Weeks   Status New   PT LONG TERM GOAL #4   Title Patient will decrease Quick DASH score by > 8 points demonstrating reduced self-reported upper extremity disability.by 03/22/16   Time 6   Period Weeks   Status New               Plan - 02/17/16 [id number]    Clinical Impression Statement Instructed patient in advanced BUE strengthening exercise. He continues to have weak shoulder flexors/abductors demonstrating increased shoulder elevation with all overhead reach; Patient also required mod VCs to slow down UE movement for better motor control; Patient would benefit from additional skilled PT intervention to improve UE strength, neck flexibility and reduce stiffness.    Pt will benefit from skilled therapeutic intervention in order to improve on the following deficits Decreased endurance;Decreased activity tolerance;Decreased strength;Impaired UE functional use;Pain;Increased muscle spasms;Decreased range of motion;Improper body mechanics;Postural dysfunction;Decreased coordination   Rehab Potential Fair   Clinical Impairments Affecting Rehab Potential positive: young in age, motivated; negative: possible chronic progressive condition; Patient's clinical presentation evolving as he has a progressive condition that has no specific timeline;    PT Frequency 2x / week   PT Duration 6 weeks   PT Treatment/Interventions Cryotherapy;ADLs/Self Care Home Management;Electrical Stimulation;Moist Heat;Traction;Therapeutic exercise;Therapeutic activities;Functional mobility training;DME Instruction;Ultrasound;Neuromuscular re-education;Patient/family education;Manual techniques;Energy conservation;Dry needling;Taping;Passive range of motion   PT Next Visit Plan advance strengthening; work on isometrics   PT Home Exercise  Plan advanced, see attached;    Consulted and Agree with Plan of Care Patient        Problem List Patient Active Problem List   Diagnosis Date Noted  . Diabetes mellitus without complication (HCC) 10/06/2015  . Hypertension 10/06/2015  . Personal history of colonic polyps   . Personal history of methicillin resistant Staphylococcus aureus 03/31/2013  . HLD (hyperlipidemia) 01/02/2007  . Allergic rhinitis 02/01/2006  . 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 Acid reflux 06/08/2002    Aasha Dina PT, DPT 02/17/2016, 8:54 AM  [place] [facility] [address] [place], [place], [address] Phone: [phone]   Fax:  [phone]  Name: Warden FillersRicky C Marini MRN: [id number] Date of Birth: 04/22/1961

## 2016-02-21 ENCOUNTER — Encounter: Payer: Self-pay | Admitting: Physical Therapy

## 2016-02-21 ENCOUNTER — Ambulatory Visit: Payer: BLUE CROSS/BLUE SHIELD | Attending: Neurology | Admitting: Physical Therapy

## 2016-02-21 DIAGNOSIS — M6281 Muscle weakness (generalized): Secondary | ICD-10-CM | POA: Insufficient documentation

## 2016-02-21 DIAGNOSIS — M436 Torticollis: Secondary | ICD-10-CM | POA: Diagnosis present

## 2016-02-21 DIAGNOSIS — R29898 Other symptoms and signs involving the musculoskeletal system: Secondary | ICD-10-CM | POA: Diagnosis present

## 2016-02-21 DIAGNOSIS — R278 Other lack of coordination: Secondary | ICD-10-CM

## 2016-02-21 NOTE — Therapy (Signed)
[facility] MAIN [facility] SERVICES [address] [place], [place], [address] Phone: [phone]   Fax:  [phone]  Physical Therapy Treatment  Patient Details  Name: Connor [name] MRN: [id number] Date of Birth: [birth year] Referring Provider: Dr. Cristopher Peru  Encounter Date: 02/21/2016      PT End of Session - 02/21/16 0843    Visit Number 3   Number of Visits 17   Date for PT Re-Evaluation 03/22/16   PT Start Time 0802   PT Stop Time 0845   PT Time Calculation (min) 43 min   Activity Tolerance Patient tolerated treatment well;No increased pain   Behavior During Therapy [facility] for tasks assessed/performed      Past Medical History  Diagnosis Date  . Hyperlipidemia   . GERD (gastroesophageal reflux disease)   . Diabetes mellitus without complication (HCC)     controlled  . Hypertension     controlled    Past Surgical History  Procedure Laterality Date  . Colonoscopy with propofol N/A 08/30/2015    Procedure: COLONOSCOPY WITH PROPOFOL;  Surgeon: Midge Minium, MD;  Location: [facility] ENDOSCOPY;  Service: Endoscopy;  Laterality: N/A;    There were no vitals filed for this visit.  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 NA   How long can you stand comfortably? hours   How long can you walk comfortably? 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 No/denies   Pain Onset More than a month ago      TREATMENT:  PT performed extensive manual therapy with patient prone: Grade II-III PA mobs, C7, T1, T2, T3, T4, T5, T6 10 sec bouts x3 each; Patient exhibits increased hypomobility but has no pain with joint mobs; Increased joint play noted at end of joint mobs especially along T1, T2;  Prone: Press up on elbows x10 reps with cues to maintain hips on table for better lumbar extension and thoracic extension; Qped: Cat/camel stretch x10 with mod verbal cues and demonstration to improve lumbar/pelvic rotation to help facilitate better thoracic mobility;  Sitting: Cervical extension with strap to promote cervical lordosis x10;  Doorway stretch 15 sec hold x3;  Standing with back against wall: Wand elbow flexion (Bicep curl) with cues to increase end range of motion 2x10; Wand chest press with AAROM to encourage better elbow flexion and positioning 2x10;  Facing wall with towel against forehead: BUE shoulder abduction 2x10 BUE shoulder flexion against wall "V" 2x10 with cues to reduce shoulder elevation;  Backward shoulder rolls x15;  Patient required min-moderate verbal/tactile cues for correct exercise technique for better mobility and ROM; HEP advanced- see patient instructions;                          PT Education - 02/21/16 6962  Education provided Yes   Education Details HEP advanced, posture   Person(s) Educated Patient   Methods Explanation;Verbal cues   Comprehension Verbalized understanding;Returned demonstration;Verbal cues required             PT Long Term Goals - 02/09/16 1024    PT LONG TERM GOAL #1   Title Patient will be independent in home exercise program to improve strength/mobility for better functional independence with ADLs. by 03/22/16   Time 6   Period Weeks   Status New   PT LONG TERM GOAL #2   Title Patient will  increase BUE gross strength to 3/5 in shoulder to improve functional strength for dressing and ADLs by 03/22/16   Time 6   Period Weeks   Status New   PT LONG TERM GOAL #3   Title Patient will increase BUE shoulder AROM to flexion >140 degrees, abduction: >130 degrees in sitting for increased functional ROM for dressing and grooming by 03/22/16   Time 6   Period Weeks   Status New   PT LONG TERM GOAL #4   Title Patient will decrease Quick DASH score by > 8 points demonstrating reduced self-reported upper extremity disability.by 03/22/16   Time 6   Period Weeks   Status New               Plan - 02/21/16 16100843    Clinical Impression Statement Intructed patient in cervical/thoracic and lumbar ROM/flexibility exercise. Patient had difficulty initiating extension due to stiffness. He required increased cues for correct exercise, particularly with prone/qped exercise. Patient responded well to manual therapy with less stiffness and improved joint mobility; He would benefit from additional skilled PT Intervention to improve ROM/strength in BUE for better tolerance with ADLs.    Pt will benefit from skilled therapeutic intervention in order to improve on the following deficits Decreased endurance;Decreased activity tolerance;Decreased strength;Impaired UE functional use;Pain;Increased muscle spasms;Decreased range of motion;Improper body mechanics;Postural dysfunction;Decreased coordination   Rehab Potential Fair   Clinical Impairments Affecting Rehab Potential positive: young in age, motivated; negative: possible chronic progressive condition; Patient's clinical presentation evolving as he has a progressive condition that has no specific timeline;    PT Frequency 2x / week   PT Duration 6 weeks   PT Treatment/Interventions Cryotherapy;ADLs/Self Care Home Management;Electrical Stimulation;Moist Heat;Traction;Therapeutic exercise;Therapeutic activities;Functional mobility training;DME  Instruction;Ultrasound;Neuromuscular re-education;Patient/family education;Manual techniques;Energy conservation;Dry needling;Taping;Passive range of motion   PT Next Visit Plan advance strengthening; work on isometrics   PT Home Exercise Plan advanced, see attached;    Consulted and Agree with Plan of Care Patient        Problem List Patient Active Problem List   Diagnosis Date Noted  . Diabetes mellitus without complication (HCC) 10/06/2015  . Hypertension 10/06/2015  . Personal history of colonic polyps   . Personal history of methicillin resistant Staphylococcus aureus 03/31/2013  . HLD (hyperlipidemia) 01/02/2007  . Allergic rhinitis 02/01/2006  . 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 Acid reflux 06/08/2002    Matayah Reyburn PT, DPT 02/21/2016, 8:45 AM  [facility] [address] [place], [place], [address] Phone: [phone]   Fax:  [phone]  Name: Warden FillersRicky C Tarbell MRN: [id number] Date of Birth: 05/30/1961

## 2016-02-21 NOTE — Patient Instructions (Addendum)
Knee Rock (Full)    Lie on back, with knees bent and together, feet flat. Slowly lower knees toward the side. Go as far as is comfortable. Hold __2__ seconds. Repeat to other side. Repeat ___15_ times. Do _2___ sessions per day.  http://gt2.exer.us/251   Copyright  VHI. All rights reserved.  Press Up (Extension)    Place hands in a position for a half push-up. Press top half of body upward using arms. Let lower back sag. Hold _2___ seconds. Lower body. Repeat _10___ times. Do __2__ sessions per day.  http://gt2.exer.us/245   Copyright  VHI. All rights reserved.  Spinal Mobility (Cat / Camel): Flexion / Extension    Get ON TARGET. Knees on bed , hands on bed:  1.Cat: Buttocks up, arch spine segmentally, bottom to top: lift chest as head moves back, look up. 2.Camel: Reverse movement. Close eyes, lower head, tuck chin, compress chest and abdomen, round back. Hold 5___ seconds. Repeat _5__ times. Do _2__ sessions per day.  Copyright  VHI. All rights reserved.  Extension    Hands behind neck (or use a bathrobe belt), bend head back as far as is comfortable. Hold __5__ seconds. Repeat _10___ times. Do __2__ sessions per day.  Copyright  VHI. All rights reserved.  CHEST: Doorway, Bilateral - Standing    Standing in doorway, place hands on wall with elbows bent at shoulder height. Lean forward. Hold _15__ seconds. __2_ reps per set, __2_ sets per day, _5_ days per week  Copyright  VHI. All rights reserved.  Roll    Inhale and bring shoulders up, back, then exhale and relax shoulders down. Repeat _10__ times. Do _2__ times per day.  Copyright  VHI. All rights reserved.

## 2016-02-23 ENCOUNTER — Encounter: Payer: BLUE CROSS/BLUE SHIELD | Admitting: Physical Therapy

## 2016-02-24 ENCOUNTER — Ambulatory Visit
Admission: RE | Admit: 2016-02-24 | Discharge: 2016-02-24 | Disposition: A | Discharge status: Home / Self Care | Payer: BLUE CROSS/BLUE SHIELD | Source: Ambulatory Visit | Attending: Neurology | Admitting: Neurology

## 2016-02-24 DIAGNOSIS — I10 Essential (primary) hypertension: Secondary | ICD-10-CM | POA: Diagnosis not present

## 2016-02-24 DIAGNOSIS — E119 Type 2 diabetes mellitus without complications: Secondary | ICD-10-CM | POA: Diagnosis not present

## 2016-02-24 DIAGNOSIS — R1310 Dysphagia, unspecified: Secondary | ICD-10-CM | POA: Diagnosis present

## 2016-02-24 DIAGNOSIS — K219 Gastro-esophageal reflux disease without esophagitis: Secondary | ICD-10-CM | POA: Insufficient documentation

## 2016-02-24 DIAGNOSIS — E785 Hyperlipidemia, unspecified: Secondary | ICD-10-CM | POA: Diagnosis not present

## 2016-02-24 NOTE — Therapy (Signed)
[facility] DIAGNOSTIC RADIOLOGY [address] [place], [place], [address] Phone: [phone]   Fax:     Modified Barium Swallow  Patient Details  Name: Connor [name] MRN: [id number] Date of Birth: 03/24/1961 No Data Recorded  Encounter Date: 02/24/2016      End of Session - 02/24/16 1343    Visit Number 1   Number of Visits 1   Date for SLP Re-Evaluation 02/24/16   SLP Start Time 1243   SLP Stop Time  1343   SLP Time Calculation (min) 60 min   Activity Tolerance Patient tolerated treatment well;No increased pain      Past Medical History  Diagnosis Date  . Hyperlipidemia   . GERD (gastroesophageal reflux disease)   . Diabetes mellitus without complication (HCC)     controlled  . Hypertension     controlled    Past Surgical History  Procedure Laterality Date  . Colonoscopy with propofol N/A 08/30/2015    Procedure: COLONOSCOPY WITH PROPOFOL;  Surgeon: Midge Miniumarren Wohl, MD;  Location: [facility] ENDOSCOPY;  Service: Endoscopy;  Laterality: N/A;    There were no vitals filed for this visit.    Patient will benefit from skilled therapeutic intervention in order to improve the following deficits and impairments:   Dysphagia - Plan: DG SWALLOWING FUNC-SPEECH PATHOLOGY, DG SWALLOWING FUNC-SPEECH PATHOLOGY   Subjective: Patient behavior: (alertness, ability to follow instructions, etc.): Patient able to follow directions and relay his swallowing history  Chief complaint: Patient recently diagnosed with ALS.  Patient complains of xerostomia    Objective:  Radiological Procedure: A videoflouroscopic evaluation of oral-preparatory, reflex initiation, and pharyngeal phases of the swallow was performed; as well as a screening of the upper esophageal phase.  I. POSTURE: Upright in MBS chair, standing  II. VIEW: Lateral and A-P  III. COMPENSATORY STRATEGIES: N/A  IV. BOLUSES ADMINISTERED:   Thin Liquid:  2 small sips, 3 rapid, consecutive  sips   Nectar-thick Liquid: 2 large sips    Puree: 3 teaspoon presentations   Mechanical Soft: 1/4 graham cracker in applesauce  V. RESULTS OF EVALUATION: A. ORAL PREPARATORY PHASE: (The lips, tongue, and velum are observed for strength and coordination)       **Overall Severity Rating: Minimal; minimally slow and disorganized  B. SWALLOW INITIATION/REFLEX: (The reflex is normal if "triggered" by the time the bolus reached the base of the tongue)  **Overall Severity Rating: Mild; triggering at the valleculae for thicker textures and while falling from the valleculae to the pyriform sinuses with thin liquids.    C. PHARYNGEAL PHASE: (Pharyngeal function is normal if the bolus shows rapid, smooth, and continuous transit through the pharynx and there is no pharyngeal residue after the swallow)  **Overall Severity Rating: Within normal limits  D. LARYNGEAL PENETRATION: (Material entering into the laryngeal inlet/vestibule but not aspirated)  None  E. ASPIRATION:  None  F. ESOPHAGEAL PHASE: (Screening of the upper esophagus): An esophageal sweep in the upright position with liquid and solid  consistencies showed mid- and distal-esophageal retention with retrograde flow within the  esophagus, but not through the PES.    ASSESSMENT: 55 year old man, recently diagnosed with ALS and with subjective complaint of xerostomia, is presenting with minimal oropharyngeal dysphagia.  Oral control of the bolus including oral hold, rotary mastication, and anterior to posterior transfer are minimally slowed/disorganized. Timing of the pharyngeal swallow is delayed, triggering at the valleculae for thicker textures and while falling from the valleculae to the  pyriform sinuses with thin liquids.  Pharyngeal aspects of swallow including hyolaryngeal excursion, tongue base retraction, epiglottic inversion, duration/amplitude of UES opening, and laryngeal vestibule closure at the height of the swallow are within  normal limits.  Initially, there was pharyngeal residue, judged to be primarily due to adhering to dry surfaces.  There was no observed laryngeal penetration or aspiration.  The patient is at not at significant risk for prandial aspiration.  An esophageal sweep in the upright position with liquid and solid  consistencies showed mid- and distal-esophageal retention with retrograde flow within the  esophagus, but not through the PES.  The patient was advised to speak with his pharmacist regarding management of xerostomia.  PLAN/RECOMMENDATIONS:   A. Diet: Usual diet   B. Swallowing Precautions: Standard    C. Recommended consultation to: follow up with Dr. Sherryll Burger as recommended   D. Therapy recommendations N/A   E. Results and recommendations were discussed with the patient immediately following the study and final report routed to the referring MD.       Problem List Patient Active Problem List   Diagnosis Date Noted  . Diabetes mellitus without complication (HCC) 10/06/2015  . Hypertension 10/06/2015  . Personal history of colonic polyps   . Personal history of methicillin resistant Staphylococcus aureus 03/31/2013  . HLD (hyperlipidemia) 01/02/2007  . Allergic rhinitis 02/01/2006  . 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 Acid reflux 06/08/2002   Dollene Primrose, MS/CCC- SLP  Leandrew Koyanagi 02/24/2016, 1:44 PM  [facility] DIAGNOSTIC RADIOLOGY [address] [place], [place], [address] Phone: [phone]   Fax:     Name: Connor [name] MRN: [id number] Date of Birth: 1960-12-23

## 2016-02-28 ENCOUNTER — Ambulatory Visit: Payer: BLUE CROSS/BLUE SHIELD | Admitting: Physical Therapy

## 2016-03-01 ENCOUNTER — Ambulatory Visit: Payer: BLUE CROSS/BLUE SHIELD | Admitting: Physical Therapy

## 2016-03-01 ENCOUNTER — Encounter: Payer: BLUE CROSS/BLUE SHIELD | Admitting: Physical Therapy

## 2016-03-01 ENCOUNTER — Encounter: Payer: Self-pay | Admitting: Physical Therapy

## 2016-03-01 DIAGNOSIS — M436 Torticollis: Secondary | ICD-10-CM | POA: Diagnosis not present

## 2016-03-01 DIAGNOSIS — R278 Other lack of coordination: Secondary | ICD-10-CM

## 2016-03-01 DIAGNOSIS — M6281 Muscle weakness (generalized): Secondary | ICD-10-CM

## 2016-03-01 NOTE — Therapy (Signed)
[place] MAIN [facility] SERVICES [address] [place], [place], [address] Phone: [phone]   Fax:  [phone]  Physical Therapy Treatment/Discharge Summary  Patient Details  Name: Connor [name] MRN: [id number] Date of Birth: [birth year] Referring Provider: Dr. Jennings Books  Encounter Date: 03/01/2016      PT End of Session - 03/01/16 1016    Visit Number 4   Number of Visits 17   Date for PT Re-Evaluation 03/22/16   PT Start Time 0945   PT Stop Time 1030   PT Time Calculation (min) 45 min   Activity Tolerance Patient tolerated treatment well;No increased pain   Behavior During Therapy [facility] for tasks assessed/performed      Past Medical History  Diagnosis Date  . Hyperlipidemia   . GERD (gastroesophageal reflux disease)   . Diabetes mellitus without complication ([place])     controlled  . Hypertension     controlled    Past Surgical History  Procedure Laterality Date  . Colonoscopy with propofol N/A 08/30/2015    Procedure: COLONOSCOPY WITH PROPOFOL;  Surgeon: Lucilla Lame, MD;  Location: [facility] ENDOSCOPY;  Service: Endoscopy;  Laterality: N/A;    There were no vitals filed for this visit.      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 NA   How long can you stand comfortably? hours   How long can you walk comfortably? 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 No/denies   Pain Onset More than a month ago            [facility] PT Assessment - 03/01/16 0001    AROM   Right Shoulder Extension 48 Degrees   Right Shoulder Flexion 65 Degrees   Left Shoulder Extension 45 Degrees   Left Shoulder Flexion 70 Degrees       TREATMENT: Warm up on UBE, BUE only backwards only level 2 x3 min (unbilled);  PT performed extensive manual therapy with patient prone: Grade II-III PA mobs, C7, T1, T2, T3, T4, T5, T6 10 sec bouts x2 each; Patient exhibits increased hypomobility but has no pain with joint mobs; Increased joint play noted at end of joint mobs especially along T1, T2;  Supine over 1/2 bolster with bolster parallel to spine x1 min; Sitting with bolster perpendicular to thoracic spine, with thoracic extension over back of chair x10 reps with cues to really stretch to end range;   PT re-assessed UE range of motion, see above; He demonstrates significant improvement in shoulder flexion although ROM is not function; He is unable to initiate shoulder abduction without shoulder elevation due to weakness;   PT applied cervical mechanical traction 15 sec pull, 10 sec rest, 20# on, 15# off x15 min with patient supine; Patient reports no pain after traction; He reports less cervical stiffness; He reports no numbness during or after traction but reports that his numbness/tingling comes and goes;  PT Education - 03/01/16 0947    Education provided Yes   Education Details HEP reinforced; findings;    Person(s) Educated Patient   Methods Explanation;Verbal cues   Comprehension Verbalized understanding;Returned demonstration;Verbal cues required             PT Long Term Goals - 03/01/16 1022    PT LONG TERM GOAL #1   Title Patient will be independent in home exercise program to improve strength/mobility for better functional independence with ADLs. by 03/22/16   Time 6   Period Weeks   Status On-going   PT LONG TERM  GOAL #2   Title Patient will increase BUE gross strength to 3/5 in shoulder to improve functional strength for dressing and ADLs by 03/22/16   Time 6   Period Weeks   Status Partially Met   PT LONG TERM GOAL #3   Title Patient will increase BUE shoulder AROM to flexion >140 degrees, abduction: >130 degrees in sitting for increased functional ROM for dressing and grooming by 03/22/16   Time 6   Period Weeks   Status Partially Met   PT LONG TERM GOAL #4   Title Patient will decrease Quick DASH score by > 8 points demonstrating reduced self-reported upper extremity disability.by 03/22/16   Time 6   Period Weeks   Status Partially Met               Plan - 03/01/16 1016    Clinical Impression Statement Patient reports going to see specialist who believes that he could be having spinal impingement which is why he is having weakness in BUE; PT performed joint mobs for increased thoracic mobiliity; educated patient in ways to promote increased thoracic extension; Finished with cervical mechanical traction; Patient denies any numbness after traction but reports that his tingling comes and goes. Patient reports concer over using too many insurance visits. He believes that he may have to have surgery and is wanting to conserve PT visits after procedure. Patient requested to stop therapy at this time and wait until he gets more information from spinal specialist. Will discharge at this time due to patient request.    Rehab Potential Fair   Clinical Impairments Affecting Rehab Potential positive: young in age, motivated; negative: possible chronic progressive condition; Patient's clinical presentation evolving as he has a progressive condition that has no specific timeline;    PT Frequency 2x / week   PT Duration 6 weeks   PT Treatment/Interventions Cryotherapy;ADLs/Self Care Home Management;Electrical Stimulation;Moist Heat;Traction;Therapeutic exercise;Therapeutic activities;Functional mobility  training;DME Instruction;Ultrasound;Neuromuscular re-education;Patient/family education;Manual techniques;Energy conservation;Dry needling;Taping;Passive range of motion   PT Next Visit Plan DC from therapy;    PT Home Exercise Plan continue as given;    Consulted and Agree with Plan of Care Patient      Patient will benefit from skilled therapeutic intervention in order to improve the following deficits and impairments:  Decreased endurance, Decreased activity tolerance, Decreased strength, Impaired UE functional use, Pain, Increased muscle spasms, Decreased range of motion, Improper body mechanics, Postural dysfunction, Decreased coordination  Visit Diagnosis: Other lack of coordination  Muscle weakness (generalized)     Problem List Patient Active Problem List   Diagnosis Date Noted  . Diabetes mellitus without complication ([place]) 85/88/5027  . Hypertension 10/06/2015  . Personal history of colonic polyps   . Personal history of methicillin resistant Staphylococcus aureus 03/31/2013  . HLD (hyperlipidemia) 01/02/2007  . Allergic rhinitis 02/01/2006  . 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 Acid  reflux 06/08/2002    Trotter,Margaret PT, DPT 03/01/2016, 12:49 PM  [place] MAIN [facility] SERVICES [address] [place], [place], [address] Phone: [phone]   Fax:  [phone]  Name: TASHAN KREITZER MRN: [id number] Date of Birth: [birth year]

## 2016-03-06 ENCOUNTER — Encounter: Payer: BLUE CROSS/BLUE SHIELD | Admitting: Physical Therapy

## 2016-03-08 ENCOUNTER — Encounter: Payer: BLUE CROSS/BLUE SHIELD | Admitting: Physical Therapy

## 2016-03-13 ENCOUNTER — Encounter: Payer: BLUE CROSS/BLUE SHIELD | Admitting: Physical Therapy

## 2016-03-15 ENCOUNTER — Encounter: Payer: BLUE CROSS/BLUE SHIELD | Admitting: Physical Therapy

## 2016-03-20 ENCOUNTER — Encounter: Payer: BLUE CROSS/BLUE SHIELD | Admitting: Physical Therapy

## 2016-03-22 ENCOUNTER — Encounter: Payer: BLUE CROSS/BLUE SHIELD | Admitting: Physical Therapy

## 2016-03-30 LAB — HM DIABETES EYE EXAM

## 2016-04-04 ENCOUNTER — Encounter: Payer: Self-pay | Admitting: Family Medicine

## 2016-04-13 ENCOUNTER — Encounter: Payer: BLUE CROSS/BLUE SHIELD | Admitting: Physical Therapy

## 2016-04-20 ENCOUNTER — Encounter: Payer: BLUE CROSS/BLUE SHIELD | Admitting: Physical Therapy

## 2016-04-22 ENCOUNTER — Other Ambulatory Visit: Payer: Self-pay | Admitting: Family Medicine

## 2016-04-23 ENCOUNTER — Other Ambulatory Visit: Payer: Self-pay | Admitting: Family Medicine

## 2016-04-23 ENCOUNTER — Ambulatory Visit (INDEPENDENT_AMBULATORY_CARE_PROVIDER_SITE_OTHER): Payer: BLUE CROSS/BLUE SHIELD | Admitting: Family Medicine

## 2016-04-23 ENCOUNTER — Encounter: Payer: Self-pay | Admitting: Family Medicine

## 2016-04-23 VITALS — BP 138/78 | HR 64 | Temp 97.5°F | Resp 16 | Wt 212.0 lb

## 2016-04-23 DIAGNOSIS — M4802 Spinal stenosis, cervical region: Secondary | ICD-10-CM | POA: Diagnosis not present

## 2016-04-23 DIAGNOSIS — R21 Rash and other nonspecific skin eruption: Secondary | ICD-10-CM | POA: Diagnosis not present

## 2016-04-23 DIAGNOSIS — IMO0001 Reserved for inherently not codable concepts without codable children: Secondary | ICD-10-CM

## 2016-04-23 DIAGNOSIS — E1165 Type 2 diabetes mellitus with hyperglycemia: Secondary | ICD-10-CM | POA: Diagnosis not present

## 2016-04-23 DIAGNOSIS — E119 Type 2 diabetes mellitus without complications: Secondary | ICD-10-CM

## 2016-04-23 DIAGNOSIS — G1221 Amyotrophic lateral sclerosis: Secondary | ICD-10-CM

## 2016-04-23 LAB — POCT GLYCOSYLATED HEMOGLOBIN (HGB A1C): Hemoglobin A1C: 9

## 2016-04-23 MED ORDER — METFORMIN HCL 1000 MG PO TABS: 1000.0000 mg | ORAL_TABLET | Freq: Two times a day (BID) | ORAL | Status: DC

## 2016-04-23 MED ORDER — FLUOCINONIDE 0.05 % EX CREA: 1.0000 "application " | TOPICAL_CREAM | Freq: Three times a day (TID) | CUTANEOUS | Status: DC

## 2016-04-23 NOTE — Progress Notes (Signed)
Subjective:     Patient ID: Connor [name], male   DOB: 07/07/1961, 55 y.o.   MRN: [id number]  HPI  Chief Complaint  Patient presents with  . Diabetes    Patient returns to office for 6 month follow up, last visit was 10/06/15 patients condition was worsening due to medication non compliance HgbA1c in house 7.9%. Patient reports that he checks his blood sugar every other day and it has been ranging from 90-200. Patient reports good compliance and tolerance on medication, he denies hyperglycemia incidents.   . Hypertension    Patient returns to office for 6 moth follow up, last visit was 10/06/15 condition was stable. Blood pressure at last visit was 110/84, patient reports blood pressure readings have been > 120/98. Patient states that he will like to discuss today changing medication.   . Rash    Patient would like to address rash on his back that has been present since 04/21/16  Continues to [facility]. Reports he has just resumed glipizide in the last month at one pill daily. He has not been taking HCTZ for unclear reasons. He is pending f/u with neurology for possible ALS and orthopedics for cervical stenosis. Discontinued Rilutek as it made him weak. He believes his symptoms are more attributable to his stenosis and is not sure he has ALS. Reports he is unsure about how he acquired the rash but has been spending a lot of time outside. Denies significant pruritis.   Review of Systems  Respiratory: Negative for shortness of breath.   Cardiovascular: Negative for chest pain and palpitations.       Objective:   Physical Exam  Constitutional: He appears well-developed and well-nourished. No distress.  Cardiovascular: Normal rate and regular rhythm.   Pulmonary/Chest: Breath sounds normal.  Musculoskeletal: He exhibits no edema (of lower extremities).  Skin:  Bilateral mid- back with several papules in a linear pattern. No pustules or vesicles noted.        Assessment:    1. Uncontrolled type 2 diabetes mellitus without complication, without long-term current use of insulin (HCC) - POCT glycosylated hemoglobin (Hb A1C)  2. Rash; ? Insect bites - fluocinonide cream (LIDEX) 0.05 %; Apply 1 application topically 3 (three) times daily. To rash  Dispense: 15 g; Refill: 0  3. ALS (amyotrophic lateral sclerosis) [facility](HCC): Per neurology  4. Cervical spinal stenosis: per orthopedics    Plan:    Resume glipizide at 2 pills before largest meal of the day and HCTZ. Bring labs for my review from your clinical trials. F/u in 3 months.

## 2016-04-23 NOTE — Patient Instructions (Addendum)
Please resume hydrochlorothiazide (HCTZ) for blood pressure. Take two glipizide 30 minutes before your largest meal of the day. Do follow up with orthopedics and neurology. Bring lab from your clinical trials for my review.

## 2016-04-27 ENCOUNTER — Encounter: Payer: BLUE CROSS/BLUE SHIELD | Admitting: Physical Therapy

## 2016-05-15 ENCOUNTER — Other Ambulatory Visit: Payer: Self-pay | Admitting: Family Medicine

## 2016-05-15 DIAGNOSIS — M62838 Other muscle spasm: Secondary | ICD-10-CM

## 2016-05-15 MED ORDER — CYCLOBENZAPRINE HCL 5 MG PO TABS: 5.0000 mg | ORAL_TABLET | Freq: Three times a day (TID) | ORAL | Status: DC | PRN

## 2016-05-15 NOTE — Telephone Encounter (Signed)
Pt requesting refill of cyclobenzaprine (FLEXERIL) 5 MG tablet sent to [facility] on [address]

## 2016-05-16 ENCOUNTER — Other Ambulatory Visit: Payer: Self-pay | Admitting: Family Medicine

## 2016-05-16 MED ORDER — LISINOPRIL 20 MG PO TABS: 20.0000 mg | ORAL_TABLET | Freq: Every day | ORAL | Status: DC

## 2016-05-16 NOTE — Telephone Encounter (Signed)
Pt contacted office for refill request on the following medications:  lisinopril (PRINIVIL,ZESTRIL) 20 MG tablet.  Pt is requesting a 15 day supply sent to the local pharmacy due ti waiting on mail order.  [facility] [address]  WU#[phone]/NFCB#[phone]/MW

## 2016-06-26 ENCOUNTER — Ambulatory Visit: Payer: BLUE CROSS/BLUE SHIELD | Attending: Neurology | Admitting: Occupational Therapy

## 2016-06-26 ENCOUNTER — Ambulatory Visit: Payer: BLUE CROSS/BLUE SHIELD | Admitting: Physical Therapy

## 2016-06-26 ENCOUNTER — Encounter: Payer: Self-pay | Admitting: Physical Therapy

## 2016-06-26 DIAGNOSIS — M436 Torticollis: Secondary | ICD-10-CM | POA: Insufficient documentation

## 2016-06-26 DIAGNOSIS — M6281 Muscle weakness (generalized): Secondary | ICD-10-CM | POA: Diagnosis present

## 2016-06-26 DIAGNOSIS — R278 Other lack of coordination: Secondary | ICD-10-CM | POA: Diagnosis present

## 2016-06-26 NOTE — Therapy (Signed)
[facility] MAIN [facility] SERVICES [address] [place], [place], [address] Phone: [phone]   Fax:  [phone]  Physical Therapy Evaluation  Patient Details  Name: Connor [name] MRN: [id number] Date of Birth: 09-15-1961 Referring Provider: Dr. Sherryll Burger  Encounter Date: 06/26/2016      [name] End of Session - 06/26/16 1248    Visit Number 1   Number of Visits 7   Date for [name] Re-Evaluation 08/07/16   [name] Start Time 1017   [name] Stop Time 1115   [name] Time Calculation (min) 58 min   Activity Tolerance Patient tolerated treatment well   Behavior During Therapy [facility] for tasks assessed/performed      Past Medical History:  Diagnosis Date  . Diabetes mellitus without complication (HCC)    controlled  . GERD (gastroesophageal reflux disease)   . Hyperlipidemia   . Hypertension    controlled    Past Surgical History:  Procedure Laterality Date  . COLONOSCOPY WITH PROPOFOL N/A 08/30/2015   Procedure: COLONOSCOPY WITH PROPOFOL;  Surgeon: Midge Minium, MD;  Location: [facility] ENDOSCOPY;  Service: Endoscopy;  Laterality: N/A;    There were no vitals filed for this visit.       Subjective Assessment - 06/26/16 1021    Subjective [name] is 55 year old man presenting back to physical therapy after taking a break since March 2017.  [name] currently has a diagnosis of "ALS like symptoms" due to bilateral shoulder weakness and decreased AROM.  MD was also concerned with [name] having cervical stenosis resulting in a cervical nerve injury.  [name] was set to have cervical fusion surgery in July but deferred due to restrictions following surgery.  He felt that after surgery he would be more limited than he currently is now and was therefore no longer interested in surgery.  He did report getting a steroid injection in his cervical spine 2 weeks ago and did get both pain relief and increase in UE ROM.  [name] reports no changes in strength or ROM in upper extremities since previously  discharging from physical therapy in March 2017.  [name] also reports no changes in neurological diagnosis. As of now, [name] is  Considered to have "ALS symptoms".    Pertinent History Personal factors affecting rehab: chronicity, diagnosis difficulty, deferred spinal fusion surgery, weakness proximally    Limitations Lifting   How long can you sit comfortably? n/a    How long can you stand comfortably? n/a    How long can you walk comfortably? n/a    Diagnostic tests n/a    Patient Stated Goals get his UE motion back    Currently in Pain? No/denies   Pain Score 2   no pain right now but typically 2/10 pain    Pain Location Shoulder  shoulders and neck   Pain Orientation Left;Right   Pain Descriptors / Indicators Tightness;Numbness;Tingling;Sore   Pain Type Chronic pain   Pain Radiating Towards [name] does report n/t down to hands, n/t comes and goes intermittently in BLEs   Pain Onset Other (comment)  chronic    Pain Frequency Intermittent   Aggravating Factors  n/a    Pain Relieving Factors boflex spray             [facility] [name] Assessment - 06/26/16 1003      Assessment   Medical Diagnosis Symptoms of ALS    Referring Provider Dr. Sherryll Burger   Onset Date/Surgical Date 06/26/14  chronic   Hand Dominance Right  Next MD Visit 08/19/16  approx 2 months   Prior Therapy Previously ended physical therapy in March for UE weakness with good outcomes, limited visits due to insurance     Precautions   Precautions None     Restrictions   Weight Bearing Restrictions No     Balance Screen   Has the patient fallen in the past 6 months No     Home Environment   Additional Comments single story home, steps to enter, railings for steps, lives with wife      Prior Function   Level of Independence Independent   Vocation On disability   Leisure buy and sell cars      Cognition   Overall Cognitive Status Within Functional Limits for tasks assessed     Observation/Other Assessments   Observations  plesant, good prior experience with therapy and motivated to get his UE strength back    Quick DASH  9.09, lower score indicating less disability, unrealistic percieved disability   MCID >8 points      Sensation   Light Touch Appears Intact   Additional Comments Reflexes: Biceps 1+ BUE, Brachioradialis LUE 2+, RUE on 1+, Tricpes 1+ BUEs     Coordination   Fine Motor Movements are Fluid and Coordinated No   Finger Nose Finger Test dysmetria noted in BUEs with difficulty with increased shoulder flexion  RAMPs-impaired, decreased speed and accuracy in BUEs     Posture/Postural Control   Posture/Postural Control Postural limitations   Postural Limitations Rounded Shoulders;Forward head;Decreased lumbar lordosis;Decreased thoracic kyphosis  decreased spinal curvature      AROM   Right Shoulder Extension 15 Degrees   Right Shoulder Flexion 35 Degrees   Right Shoulder ABduction 40 Degrees   Left Shoulder Extension 15 Degrees   Left Shoulder Flexion 40 Degrees   Left Shoulder ABduction 40 Degrees     Strength   Right Shoulder Flexion 3-/5   Right Shoulder Extension 4-/5   Right Shoulder ABduction 3-/5   Left Shoulder Flexion 3-/5   Left Shoulder Extension 4-/5   Left Shoulder ABduction 3-/5   Right Elbow Flexion 4-/5   Right Elbow Extension 4+/5   Left Elbow Flexion 3+/5   Left Elbow Extension 4+/5     Palpation   Spinal mobility Hypomobile with central PAs from C7-T8 with increased pain noted over T1-T3, [name] reported decreased in spinal tenderness in lumbar region, tightness in paraspinals over thoracic spine noted but not painful, no tenderness over multifidis   Palpation comment tenderness and painful over bilateral upper trapezius      Ambulation/Gait   Gait Comments ambulates without AD or assist, decreased trunk rotation and little to no arm swing bilaterallly     High Level Balance   High Level Balance Comments able to stand unassisted without LOB      Treatment   Grade IV central PAs to C7-T8, 20 second bouts x 3 rounds, [name] reported decrease in cervical/thoracic tightness after manual  Prone rows, 2 sets x 8 reps without weight, min VCs for eccentric control while keeping shoulder on the table Prone straight arm extensions, 2 sets x 8 reps withotu weights, min VCs to keep elbow extended throughout, [name] demonstrated increased difficulty with LUE compared to RUE  Prone rows and straight arm extensions initated into HEP                       [name] Education - 06/26/16 1248    Education provided  Yes   Education Details posture, HEP, plan of care    Person(s) Educated Patient   Methods Tactile cues;Explanation;Demonstration   Comprehension Verbalized understanding;Verbal cues required;Returned demonstration             [name] Long Term Goals - 06/26/16 1306      [name] LONG TERM GOAL #1   Title [name] will be independent in HEP to improve his overall strength for more functional mobility.     Time 6   Period Weeks   Status New     [name] LONG TERM GOAL #2   Title [name] will increase BUE shoulder strength to 3/5 to allow [name] to reach across midline to bedside table, into fridge etc to promote greater functional mobility.     Time 6   Status New     [name] LONG TERM GOAL #3   Title [name] will report worst shoulder/neck pain of 2/10 to allow for full ability to perform ADLs   Time 6   Period Weeks     [name] LONG TERM GOAL #4   Title [name] will increase BUE shoulder flexion ROM >80 degrees, abduction >80 degrees for more functional mobility and ease with driving and dressing.     Time 6   Period Weeks   Status New     [name] LONG TERM GOAL #5   Title [name] will self report decreased quick DASH score of  >8 points to demonstrate self reported lower disability.     Time 6   Period Weeks   Status New               Plan - 06/26/16 1259    Clinical Impression Statement [name] is 55 year old presenting to physical therapy after taking a break since March  2017.  He continues to report no change in differential diagnosis and as of now is considered to have symptoms of ALS. [name] deferred his cervical fusion surgery due to restrictions after surgery.  [name] presents today with decreased bilateral upper extremity strength more prominent in the shoulders rather than elbow or forearm in BUEs.  He also demonstrates difficulty with UE coordination with finger to nose test and RAMPS demonstrating dysmetria especially with movements that involve shoulder flexion.  [name] shoulder AROM is limited to 40 degrees or less with shoulder flexion, extension and abduction. [name] seems to have unrealistic view of perceived disability as he self reports 9.09% on Quick Dash.  [name] demonstrated hypomobility in lower cervical and thoracic spine but responded well with a decrease in pain/tightness with central PA mobs to C7-T8.  He would benefit from skilled physical therapy to address his bilateral shoulder ROM, and strength for more functional mobility.     Rehab Potential Good   Clinical Impairments Affecting Rehab Potential Positive: young, motivated     Negative: chronicity, n/t, difficulty diagnosing, proximal weakness     Clinical impression: evolving due to chronicity and difficulty diagnosing    [name] Frequency 1x / week   [name] Duration 6 weeks   [name] Treatment/Interventions Cryotherapy;Electrical Stimulation;Ultrasound;Traction;Moist Heat;Functional mobility training;Therapeutic activities;Therapeutic exercise;Balance training;Passive range of motion;Patient/family education;Manual techniques;Neuromuscular re-education;Energy conservation   [name] Next Visit Plan check GH mobility and PROM    [name] Home Exercise Plan see [name] instructions       Patient will benefit from skilled therapeutic intervention in order to improve the following deficits and impairments:  Decreased activity tolerance, Decreased range of motion, Hypomobility, Decreased strength, Decreased mobility, Pain, Postural dysfunction,  Impaired UE functional  use, Impaired flexibility  Visit Diagnosis: Muscle weakness (generalized)  Neck stiffness  Other lack of coordination     Problem List Patient Active Problem List   Diagnosis Date Noted  . ALS (amyotrophic lateral sclerosis) (HCC) 04/23/2016  . Cervical spinal stenosis 04/23/2016  . History of colon polyps 01/04/2016  . Diabetes mellitus without complication (HCC) 10/06/2015  . Hypertension 10/06/2015  . Personal history of colonic polyps   . Personal history of methicillin resistant Staphylococcus aureus 03/31/2013  . History of methicillin resistant Staphylococcus aureus infection 03/31/2013  . HLD (hyperlipidemia) 01/02/2007  . Allergic rhinitis 02/01/2006  . 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 Acid reflux 06/08/2002  . Gastro-esophageal reflux disease without esophagitis 06/08/2002   Connor [name], SPT  This entire session was performed under direct supervision and direction of a licensed therapist/therapist assistant . I have personally read, edited and approve of the note as written.  Connor [name],Connor [name], Connor [name] 06/27/2016, 11:29 AM  [facility] MAIN [facility] [address] [place], [place], [address] Phone: [phone]   Fax:  [phone]  Name: Connor [name] MRN: [id number] Date of Birth: 05/02/1961

## 2016-06-26 NOTE — Patient Instructions (Addendum)
Abduction: Horizontal - Prone    Lie with arms hanging straight down. Reach up and out as far as is pain free, elbows slightly bent. Repeat 8 times for 2 sets.  Do with both arms separately.  Keep shoulder on table and go slow.     Copyright  VHI. All rights reserved.  Extension - Prone (Dumbbell)    Lie with right arm hanging off side of bed. Lift hand back and up. Repeat 8 times for 2 sets.  Do both arms separately.     Copyright  VHI. All rights reserved.

## 2016-06-26 NOTE — Therapy (Signed)
[name] MAIN [facility] SERVICES [address] [place], [place], [address] Phone: [phone]   Fax:  [phone]  Occupational Therapy Evaluation  Patient Details  Name: Connor [name] MRN: [id number] Date of Birth: [birth year] Referring Provider: Dr.Shah  Encounter Date: 06/26/2016      OT End of Session - 06/26/16 1707    Visit Number 1   Number of Visits 1   Date for OT Re-Evaluation 06/26/16   OT Start Time 0915   OT Stop Time 0955   OT Time Calculation (min) 40 min   Activity Tolerance Patient tolerated treatment well   Behavior During Therapy [facility] for tasks assessed/performed      Past Medical History:  Diagnosis Date  . Diabetes mellitus without complication (HCC)    controlled  . GERD (gastroesophageal reflux disease)   . Hyperlipidemia   . Hypertension    controlled    Past Surgical History:  Procedure Laterality Date  . COLONOSCOPY WITH PROPOFOL N/A 08/30/2015   Procedure: COLONOSCOPY WITH PROPOFOL;  Surgeon: Midge Minium, MD;  Location: [facility] ENDOSCOPY;  Service: Endoscopy;  Laterality: N/A;    There were no vitals filed for this visit.      Subjective Assessment - 06/26/16 1422    Subjective  Pt. reports being indpendent with ADL/IADL tasks despite his limited proximal bilateral shoulder ROM.   Pertinent History Pt. has had several months of progress BUE proximal shoulder weakness, and reports has been diagnosed Symptoms of ALS.   Patient Stated Goals To be able to raise his arms.   Currently in Pain? No/denies   Pain Score 0-No pain           [facility] OT Assessment - 06/26/16 0001      Assessment   Diagnosis ALS   Referring Provider Dr.Shah   Onset Date 06/27/15   Prior Therapy Outpatient PT     Balance Screen   Has the patient fallen in the past 6 months No   Has the patient had a decrease in activity level because of a fear of falling?  No   Is the patient reluctant to leave their home because of a fear of  falling?  No     Home  Environment   Family/patient expects to be discharged to: Private residence   Living Arrangements Spouse/significant other   Type of Home House   Home Access Stairs   Home Layout One level   Bathroom Copywriter, advertising;Door   [employer] None   Lives With Spouse     Prior Function   Level of Independence Independent   Vocation On disability     ADL   Eating/Feeding Independent   Grooming Independent   Upper Body Bathing Independent   Lower Body Bathing Independent   Upper Body Dressing Independent   Lower Body Dressing Independent   Glass blower/designer -  Hygiene Independent   Tub/Shower Transfer Independent   ADL comments Pt. reports being able to complete ADL and IADL tasks efficiently.  MAM-20  for Patients with Neurollogical conditions score: 100     IADL   Prior Level of Function Shopping Independent   Shopping Takes care of all shopping needs independently   Prior Level of Function Light Housekeeping --  Independent   Light Housekeeping Maintains house alone or with occasional assistance   Prior Level of Function Meal Prep --  Independent   Meal Prep Plans, prepares and serves  adequate meals independently;Able to complete simple cold meal and snack prep   Community Mobility Drives own vehicle   Prior Level of Function Meal Prep --  Independent   Medication Management Is responsible for taking medication in correct dosages at correct time     Written Expression   Dominant Hand Right     Vision - History   Baseline Vision --  No changes to vision     Cognition   Overall Cognitive Status Within Functional Limits for tasks assessed     Sensation   Light Touch Appears Intact     Coordination   Right 9 Hole Peg Test 44 sec   Left 9 Hole Peg Test 45 sec     AROM   Overall AROM Comments Shoulder flexion: Left 40, Right 30; Abduction: Left: 38, abduction 42. Bilateral elbow  flexion, extension, forearm supination, pronation, wrist flexion, extension WFL.     PROM   PROM Assessment Site --  Shoulder flexion: L: 137, R:130; Abduction, L: 90, R: 90     Strength   Overall Strength Comments BUE shoulder flexion 2+/5, elbow flexion: 4-/5, extension:: 4+/5, wrist extension 4+/5     Hand Function   Right Hand Grip (lbs) 105#   Right Hand Lateral Pinch 27 lbs   Right Hand 3 Point Pinch 25 lbs   Left Hand Grip (lbs) 88#   Left Hand Lateral Pinch 30 lbs   Left 3 point pinch 25 lbs                         OT Education - 06/26/16 1707    Education provided Yes   Education Details OT services   Person(s) Educated Patient   Methods Explanation   Comprehension Verbalized understanding                    Plan - 06/26/16 1714    Clinical Impression Statement Pt. is a 55 y.o. male who presents with bilateral proximal shoulder weakness, decreased ROM, and decreased strength. Pt. distal strength, grip strength, pinch strength, and coordination skills are WNL. Pt. reports he is a ble to complete all ADL tasks, and IADL tasks as efficiently as he would like. Pt. scored 100/100 on the MAM-20 For Patients with Neurologic Conditions. Further OT services are not warranted at this time. No further POC, or treatment is indicated at this time. Pt. is in agreement, and will conitnue with PT services..      Patient will benefit from skilled therapeutic intervention in order to improve the following deficits and impairments:     Visit Diagnosis: Muscle weakness (generalized) - Plan: Ot plan of care cert/re-cert    Problem List Patient Active Problem List   Diagnosis Date Noted  . ALS (amyotrophic lateral sclerosis) (HCC) 04/23/2016  . Cervical spinal stenosis 04/23/2016  . History of colon polyps 01/04/2016  . Diabetes mellitus without complication (HCC) 10/06/2015  . Hypertension 10/06/2015  . Personal history of colonic polyps   . Personal  history of methicillin resistant Staphylococcus aureus 03/31/2013  . History of methicillin resistant Staphylococcus aureus infection 03/31/2013  . HLD (hyperlipidemia) 01/02/2007  . Allergic rhinitis 02/01/2006  . 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 Acid reflux 06/08/2002  . Gastro-esophageal reflux disease without esophagitis 06/08/2002    Olegario Messier, MS, OTR/L 06/26/2016, 5:26 PM  [facility] MAIN [facility] SERVICES [address] [place], [place], [address] Phone: [phone]  Fax:  [phone]  Name: Connor [name] MRN: [id number] Date of Birth: 11/19/1960

## 2016-06-28 ENCOUNTER — Ambulatory Visit: Payer: BLUE CROSS/BLUE SHIELD | Admitting: Physical Therapy

## 2016-06-28 ENCOUNTER — Encounter: Payer: BLUE CROSS/BLUE SHIELD | Admitting: Occupational Therapy

## 2016-07-02 ENCOUNTER — Ambulatory Visit: Payer: BLUE CROSS/BLUE SHIELD | Admitting: Physical Therapy

## 2016-07-02 DIAGNOSIS — M6281 Muscle weakness (generalized): Secondary | ICD-10-CM

## 2016-07-02 DIAGNOSIS — M436 Torticollis: Secondary | ICD-10-CM

## 2016-07-02 DIAGNOSIS — R278 Other lack of coordination: Secondary | ICD-10-CM

## 2016-07-02 NOTE — Therapy (Signed)
[place] [facility] [address] [place], [place], [address] Phone: [phone]   Fax:  [phone]  Physical Therapy Treatment  Patient Details  Name: Connor [name] MRN: [id number] Date of Birth: 02/03/1961 Referring Provider: Dr. Sherryll BurgerShah  Encounter Date: 07/02/2016      PT End of Session - 07/02/16 1153    Visit Number 2   Number of Visits 7   Date for PT Re-Evaluation 08/07/16   PT Start Time 1100   PT Stop Time 1148   PT Time Calculation (min) 48 min   Activity Tolerance Patient tolerated treatment well   Behavior [facility] for tasks assessed/performed      Past Medical History:  Diagnosis Date  . Diabetes mellitus without complication (HCC)    controlled  . GERD (gastroesophageal reflux disease)   . Hyperlipidemia   . Hypertension    controlled    Past Surgical History:  Procedure Laterality Date  . COLONOSCOPY WITH PROPOFOL N/A 08/30/2015   Procedure: COLONOSCOPY WITH PROPOFOL;  Surgeon: Midge Miniumarren Wohl, MD;  Location: [facility] ENDOSCOPY;  Service: Endoscopy;  Laterality: N/A;    There were no vitals filed for this visit.      Subjective Assessment - 07/02/16 1152    Subjective Pt reports that he has been doing the exercises at home and that his numbness/tingling is about the same in his BUEs    Pertinent History Personal factors affecting rehab: chronicity, diagnosis difficulty, deferred spinal fusion surgery, weakness proximally    Limitations Lifting   How long can you sit comfortably? n/a    How long can you stand comfortably? n/a    How long can you walk comfortably? n/a    Diagnostic tests n/a    Patient Stated Goals get his UE motion back    Currently in Pain? No/denies       Treatment Central prone PAs grade 3-4 to C7-T7, 3 bouts 20 seconds each segment, pt reported decreased in cervical/thoracic tension and increased looseness after manual x 12 mins  Sidelying overhead flexion, 2 sets x 10  reps BUEs, min tactile cues for target and min VCs to increase overhead flexion Sidelying shoulder abduction, 2 sets x 10 reps BUEs, min tactile cue for target, pt demonstrated increased diffiuclty with eccentric control on LUE compared to RUE Inferior and posterior grade 3 mobs to LUE GH followed by passive shoulder flexion and shoulder abduction x 6 mins, pt reported decrease in LUE tightness after manual  Chest press with overhead flexion 2lb weight, 2 sets x 10 reps, mod VCs for technique and min A to bring wand back over chest  Serratus punches without weight, 2 sets x 10 reps, min VCs to maintain elbow flexion  Seated IR ball squeezes, 2 sets x 10 reps, min VCs for upright posture and three second isometric hold  Standing retractions with yellow theraband resistance, 2 sets x 10 reps, min VCs for upright posture and slow repetitions  Standing mid rows with yellow theraband resistance, 2 sets x 10 reps, min VCs for scapular retraction throughout  Standing AAROM shoulder flexion pushing therapy ball up wall x 10 reps, min VCs to keep BUEs on ball throughout  Standing shoulder flexion finger crawl up wall to initiate into HEP x 10 reps, min VCs for upright posture                           PT Education -  07/02/16 1153    Education provided Yes   Education Details addition of HEP   Person(s) Educated Patient   Methods Explanation;Demonstration;Verbal cues   Comprehension Verbalized understanding;Returned demonstration;Verbal cues required             PT Long Term Goals - 06/26/16 1306      PT LONG TERM GOAL #1   Title Pt will be independent in HEP to improve his overall strength for more functional mobility.     Time 6   Period Weeks   Status New     PT LONG TERM GOAL #2   Title Pt will increase BUE shoulder strength to 3/5 to allow pt to reach across midline to bedside table, into fridge etc to promote greater functional mobility.     Time 6   Status New      PT LONG TERM GOAL #3   Title Pt will report worst shoulder/neck pain of 2/10 to allow for full ability to perform ADLs   Time 6   Period Weeks     PT LONG TERM GOAL #4   Title Pt will increase BUE shoulder flexion ROM >80 degrees, abduction >80 degrees for more functional mobility and ease with driving and dressing.     Time 6   Period Weeks   Status New     PT LONG TERM GOAL #5   Title Pt will self report decreased quick DASH score of  >8 points to demonstrate self reported lower disability.     Time 6   Period Weeks   Status New               Plan - 07/02/16 1153    Clinical Impression Statement Pt continues to report "normal" n/t in his BUEs and similar tightness in his lower cervical/upper thoracic spine.  Pt responded well to central PAs to C7-T7 grade 3 mobs with reported looseness and decrease in tension after PAs.  Pt demonstrated some GH tighness of LUE and reported it feeling looser after posterior and inferior GH PAs.  Initiated gravity eliminated UE shoulder movement and pt required min VCs throughout to slow repetitions for increased muscle activation.  HEP was advanced with standing retrations, extensions, AAROM shoulder flexion and finger wall crawl.  He would continue to benefit from further skilled physical therapy to increase active shoulder ROM and strength for greater functional mobility.     Rehab Potential Good   Clinical Impairments Affecting Rehab Potential Positive: young, motivated     Negative: chronicity, n/t, difficulty diagnosing, proximal weakness     Clinical impression: evolving due to chronicity and difficulty diagnosing    PT Frequency 1x / week   PT Duration 6 weeks   PT Treatment/Interventions Cryotherapy;Electrical Stimulation;Ultrasound;Traction;Moist Heat;Functional mobility training;Therapeutic activities;Therapeutic exercise;Balance training;Passive range of motion;Patient/family education;Manual techniques;Neuromuscular  re-education;Energy conservation   PT Next Visit Plan arm bike, lat machine, row machine,    PT Home Exercise Plan see pt instructions       Patient will benefit from skilled therapeutic intervention in order to improve the following deficits and impairments:  Decreased activity tolerance, Decreased range of motion, Hypomobility, Decreased strength, Decreased mobility, Pain, Postural dysfunction, Impaired UE functional use, Impaired flexibility  Visit Diagnosis: Muscle weakness (generalized)  Other lack of coordination  Neck stiffness     Problem List Patient Active Problem List   Diagnosis Date Noted  . ALS (amyotrophic lateral sclerosis) (HCC) 04/23/2016  . Cervical spinal stenosis 04/23/2016  . History of colon polyps  01/04/2016  . Diabetes mellitus without complication (HCC) 10/06/2015  . Hypertension 10/06/2015  . Personal history of colonic polyps   . Personal history of methicillin resistant Staphylococcus aureus 03/31/2013  . History of methicillin resistant Staphylococcus aureus infection 03/31/2013  . HLD (hyperlipidemia) 01/02/2007  . Allergic rhinitis 02/01/2006  . 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 Acid reflux 06/08/2002  . Gastro-esophageal reflux disease without esophagitis 06/08/2002   Waldon Reiningaylor Samera Macy, SPT  This entire session was performed under direct supervision and direction of a licensed therapist/therapist assistant . I have personally read, edited and approve of the note as written.  Trotter,Margaret PT, DPT 07/02/2016, 1:42 PM  [place] [facility] [address] [place], [place], [id number] Phone: [phone]   Fax:  [phone]  Name: Connor [name] MRN: [id number] Date of Birth: 03/22/1961

## 2016-07-02 NOTE — Patient Instructions (Addendum)
  Walk Up Exercise (Active/Assistive)   Walk fingers up the wall and then slowly walk them back down.   Repeat 10 times x 2 sets   Copyright  VHI. All rights reserved.  Cane Overhead - Supine    Hold cane at thighs with both hands, extend arms straight over head. . Repeat __10_ times. Do __2_ times per day.  Copyright  VHI. All rights reserved.  (Home) Extension / Flexion (Assist)    Face anchor, right arm as far forward and up as is pain free. Pull arm down toward side. Repeat ___10_ times per set. Do __2_ sets per session. Do __5__ sessions per week.   Copyright  VHI. All rights reserved.  (Clinic) Retraction: Row - Bilateral (Pulley)    Facing pulley, arms reaching forward, pull hands toward stomach, pinching shoulder blades together. Repeat __10__ times per set. Do _2___ sets per session. Do __5__ sessions per week.   Copyright  VHI. All rights reserved.

## 2016-07-09 ENCOUNTER — Ambulatory Visit: Payer: BLUE CROSS/BLUE SHIELD | Admitting: Physical Therapy

## 2016-07-09 ENCOUNTER — Encounter: Payer: BLUE CROSS/BLUE SHIELD | Admitting: Occupational Therapy

## 2016-07-13 ENCOUNTER — Ambulatory Visit: Payer: BLUE CROSS/BLUE SHIELD | Admitting: Physical Therapy

## 2016-07-13 ENCOUNTER — Encounter: Payer: Self-pay | Admitting: Physical Therapy

## 2016-07-13 DIAGNOSIS — M6281 Muscle weakness (generalized): Secondary | ICD-10-CM | POA: Diagnosis not present

## 2016-07-13 DIAGNOSIS — R278 Other lack of coordination: Secondary | ICD-10-CM

## 2016-07-13 DIAGNOSIS — M436 Torticollis: Secondary | ICD-10-CM

## 2016-07-13 NOTE — Therapy (Signed)
[place] [facility] MAIN [facility] SERVICES [address] [place], [place], [address] Phone: [phone]   Fax:  [phone]  Physical Therapy Treatment  Patient Details  Name: Connor [name] MRN: [id number] Date of Birth: May 31, 1961 Referring Provider: Dr. Sherryll Burger  Encounter Date: 07/13/2016      PT End of Session - 07/13/16 0950    Visit Number 3   Number of Visits 7   Date for PT Re-Evaluation 08/07/16   PT Start Time 0902   PT Stop Time 0945   PT Time Calculation (min) 43 min   Activity Tolerance Patient tolerated treatment well   Behavior During Therapy [facility] for tasks assessed/performed      Past Medical History:  Diagnosis Date  . Diabetes mellitus without complication (HCC)    controlled  . GERD (gastroesophageal reflux disease)   . Hyperlipidemia   . Hypertension    controlled    Past Surgical History:  Procedure Laterality Date  . COLONOSCOPY WITH PROPOFOL N/A 08/30/2015   Procedure: COLONOSCOPY WITH PROPOFOL;  Surgeon: Midge Minium, MD;  Location: [facility] ENDOSCOPY;  Service: Endoscopy;  Laterality: N/A;    There were no vitals filed for this visit.      Subjective Assessment - 07/13/16 0914    Subjective Pt reports compliance with HEP and that he has also been doing yardwork to work on LUE strength.  He reports similar numbness and tingling in BUEs but no n/t today.     Pertinent History Personal factors affecting rehab: chronicity, diagnosis difficulty, deferred spinal fusion surgery, weakness proximally    Limitations Lifting   How long can you sit comfortably? n/a    How long can you stand comfortably? n/a    How long can you walk comfortably? n/a    Diagnostic tests n/a    Patient Stated Goals get his UE motion back    Currently in Pain? No/denies       Treatment Prone centralPAs to T1-T11, 2 sets x 20 seconds, grade 3-4, pt reports soreness over upper thoracicspine but reports some decrease in sorness after manual (10 mins)   Chest press and over head flexion with 2lb wand, 2 sets x 10 reps, min VCs for initial positioning and technique corrections throughout  Overhead 2lb wand tricep extension, 2 sets x 10 reps, min A to stabilize elbows next to body, min VCs to increase elbow extension ROM Quadruped weight shifting ball roll with yellow weighted ball, min VCs for initial set up,2 sets x 10 reps,  min VCs for hand positioning and slowing of movement for full effect of exercise Quadruped weight shifting on elbows laterally, 1 set x 20 reps, min tactile cues for posture and set up, min VCs to increase weight shifting through shoulders  Standing bent over lawnmovers with 3 lb weight, 2 sets x 10 reps BUEs, min VCs for eccentric control down, min tactile cues for scapular retraction IR ball squeezes seated, 2 sets x 10 reps, 3 second isometric hold, min VCs for upright posture and decreased shoulder elevation  Lat machine level 3, 2 sets x 10 reps, min A to bring bar down for pt to get hand placement, min A for greater eccentric control Mid row machine level 2, 2 sets x 10 reps, min tactile cues for scapular retraction, min VCs for upright posture                            PT  Education - 07/13/16 0949    Education provided Yes   Education Details reinforced HEP, eccentric control of exercises    Person(s) Educated Patient   Methods Explanation;Demonstration;Verbal cues   Comprehension Verbalized understanding;Returned demonstration;Verbal cues required             PT Long Term Goals - 06/26/16 1306      PT LONG TERM GOAL #1   Title Pt will be independent in HEP to improve his overall strength for more functional mobility.     Time 6   Period Weeks   Status New     PT LONG TERM GOAL #2   Title Pt will increase BUE shoulder strength to 3/5 to allow pt to reach across midline to bedside table, into fridge etc to promote greater functional mobility.     Time 6   Status New     PT LONG  TERM GOAL #3   Title Pt will report worst shoulder/neck pain of 2/10 to allow for full ability to perform ADLs   Time 6   Period Weeks     PT LONG TERM GOAL #4   Title Pt will increase BUE shoulder flexion ROM >80 degrees, abduction >80 degrees for more functional mobility and ease with driving and dressing.     Time 6   Period Weeks   Status New     PT LONG TERM GOAL #5   Title Pt will self report decreased quick DASH score of  >8 points to demonstrate self reported lower disability.     Time 6   Period Weeks   Status New               Plan - 07/13/16 0950    Clinical Impression Statement Pt reports similar n/t throughout the week in BUEs but reports compliance with HEP and doing yardwork for further UE strengthening.  Pt demonstrates better upright posture and scapular retraction with all exercises today compared to previous session.  Continued to perform central prone PAs to thoracic spine due to hypomobility, pt reported decreased stiffness after manual. Initated further scapular and shoulder strengthening with quadruped weightshifting, lat machine, and row machine.  Pt demonstrated adequate control and proper technique with machine exercises.  Pt was able to appropriately weight shift without LOB and with good core control in quadruped.  He would continue to benefit from further skilled PT to increase his bilateral shoulder and UE strengthening to improve his functional mobility.     Rehab Potential Good   Clinical Impairments Affecting Rehab Potential Positive: young, motivated     Negative: chronicity, n/t, difficulty diagnosing, proximal weakness     Clinical impression: evolving due to chronicity and difficulty diagnosing    PT Frequency 1x / week   PT Duration 6 weeks   PT Treatment/Interventions Cryotherapy;Electrical Stimulation;Ultrasound;Traction;Moist Heat;Functional mobility training;Therapeutic activities;Therapeutic exercise;Balance training;Passive range of  motion;Patient/family education;Manual techniques;Neuromuscular re-education;Energy conservation   PT Next Visit Plan lat machine, row mid and underhead, thoracic extensions over chair, tricep training, shoulder flexion    PT Home Exercise Plan see pt instructions       Patient will benefit from skilled therapeutic intervention in order to improve the following deficits and impairments:  Decreased activity tolerance, Decreased range of motion, Hypomobility, Decreased strength, Decreased mobility, Pain, Postural dysfunction, Impaired UE functional use, Impaired flexibility  Visit Diagnosis: Muscle weakness (generalized)  Other lack of coordination  Neck stiffness     Problem List Patient Active Problem List   Diagnosis Date  Noted  . ALS (amyotrophic lateral sclerosis) (HCC) 04/23/2016  . Cervical spinal stenosis 04/23/2016  . History of colon polyps 01/04/2016  . Diabetes mellitus without complication (HCC) 10/06/2015  . Hypertension 10/06/2015  . Personal history of colonic polyps   . Personal history of methicillin resistant Staphylococcus aureus 03/31/2013  . History of methicillin resistant Staphylococcus aureus infection 03/31/2013  . HLD (hyperlipidemia) 01/02/2007  . Allergic rhinitis 02/01/2006  . 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 Acid reflux 06/08/2002  . Gastro-esophageal reflux disease without esophagitis 06/08/2002   Waldon Reining, SPT  This entire session was performed under direct supervision and direction of a licensed therapist/therapist assistant . I have personally read, edited and approve of the note as written.  Trotter,Margaret PT, DPT 07/13/2016, 10:36 AM  [place] [facility] MAIN [facility] SERVICES [address] [place], [place], [address] Phone: [phone]   Fax:  [phone]  Name: SHYHIEM BEENEY MRN: [id number] Date of Birth: 11/25/1960

## 2016-07-19 ENCOUNTER — Encounter: Payer: Self-pay | Admitting: Physical Therapy

## 2016-07-19 ENCOUNTER — Ambulatory Visit: Payer: BLUE CROSS/BLUE SHIELD | Admitting: Physical Therapy

## 2016-07-19 DIAGNOSIS — M6281 Muscle weakness (generalized): Secondary | ICD-10-CM | POA: Diagnosis not present

## 2016-07-19 DIAGNOSIS — M436 Torticollis: Secondary | ICD-10-CM

## 2016-07-19 DIAGNOSIS — R278 Other lack of coordination: Secondary | ICD-10-CM

## 2016-07-19 NOTE — Therapy (Signed)
[place] [facility] MAIN [facility] SERVICES [address] [place], [place], [address] Phone: [phone]   Fax:  [phone]  Physical Therapy Treatment  Patient Details  Name: Connor [name] MRN: [id number] Date of Birth: May 17, 1961 Referring Provider: Dr. Sherryll Burger  Encounter Date: 07/19/2016      [name] End of Session - 07/19/16 1731    Visit Number 4   Number of Visits 7   Date for [name] Re-Evaluation 08/07/16   [name] Start Time 1632   [name] Stop Time 1715   [name] Time Calculation (min) 43 min   Activity Tolerance Patient tolerated treatment well   Behavior During Therapy [facility] for tasks assessed/performed      Past Medical History:  Diagnosis Date  . Diabetes mellitus without complication (HCC)    controlled  . GERD (gastroesophageal reflux disease)   . Hyperlipidemia   . Hypertension    controlled    Past Surgical History:  Procedure Laterality Date  . COLONOSCOPY WITH PROPOFOL N/A 08/30/2015   Procedure: COLONOSCOPY WITH PROPOFOL;  Surgeon: Midge Minium, MD;  Location: [facility] ENDOSCOPY;  Service: Endoscopy;  Laterality: N/A;    There were no vitals filed for this visit.      Subjective Assessment - 07/19/16 1633    Subjective [name] reports compliance with HEP but still reports that his thoracic spine is about a 7/10 soreness.     Pertinent History Personal factors affecting rehab: chronicity, diagnosis difficulty, deferred spinal fusion surgery, weakness proximally    Limitations Lifting   How long can you sit comfortably? n/a    How long can you stand comfortably? n/a    How long can you walk comfortably? n/a    Diagnostic tests n/a    Patient Stated Goals get his UE motion back    Currently in Pain? Yes   Pain Score 7    Pain Location Back   Pain Orientation Lower   Pain Descriptors / Indicators Sore       Treatment Central PAs to T1-T11 grade 3-4, 3 bouts x 20 second each segment, [name] reports feeling increased looseness after manual therapy (10  mins manual)    Prone thoracic extensions, 3 sets x 10 reps, min VCs for positioning and slow control  Seated thoracic extension over chair, 2 sets x 10 reps, min VCs to increase thoracic extension and keep UEs behind head throughout motion  Quadruped ball roll between UEs with orange weighted ball, 2 sets x 10 reps, min VCs to increase weight bearing through shoulders  Supine chest press and overhead flexion, 3#, 2 sets x 10 reps, min VCs for positioning throughout Plantargrade row and tricep extension, #3, 1 set x 10 reps BUEs, min VCs for positioning and increased scapular retraction  Mid Rows on cable machine, 2 sets x 10 reps, level 3, min VCs to increase scapular retraction  Low Rows on cable machine, 2 sets x 10 reps, level 3, min VCs to increase eccentric control  Lat pull down on cable machine, 2 sets x 10 reps, level 2, min A to put BUEs on bar and min VCs to increase eccentric control  Tricep pull down, level 3, min VCs to keep elbows positioned to side and increase elbow extension                            [name] Education - 07/19/16 1731    Education provided Yes   Education Details thoracic extensions  for HEP    Person(s) Educated Patient   Methods Explanation;Demonstration;Verbal cues   Comprehension Verbalized understanding;Returned demonstration;Verbal cues required             [name] Long Term Goals - 06/26/16 1306      [name] LONG TERM GOAL #1   Title [name] will be independent in HEP to improve his overall strength for more functional mobility.     Time 6   Period Weeks   Status New     [name] LONG TERM GOAL #2   Title [name] will increase BUE shoulder strength to 3/5 to allow [name] to reach across midline to bedside table, into fridge etc to promote greater functional mobility.     Time 6   Status New     [name] LONG TERM GOAL #3   Title [name] will report worst shoulder/neck pain of 2/10 to allow for full ability to perform ADLs   Time 6   Period Weeks     [name]  LONG TERM GOAL #4   Title [name] will increase BUE shoulder flexion ROM >80 degrees, abduction >80 degrees for more functional mobility and ease with driving and dressing.     Time 6   Period Weeks   Status New     [name] LONG TERM GOAL #5   Title [name] will self report decreased quick DASH score of  >8 points to demonstrate self reported lower disability.     Time 6   Period Weeks   Status New               Plan - 07/19/16 1732    Clinical Impression Statement [name] continues to report soreness in thoracic spine at about 7/10.  Central grade 3-4 PAs to thoracic spine were performed and [name] was extremely hypomobilie, [name] reported decrease in soreness thorughout thoracic spine.  Thoracic extensions prone and seated were added to HEP for increased thoracic mobility.  Continued to progress scapular, shoulder, chest, and tricep strength with cable machine exercises.  [name] demonstrated greater control but continues to require min VCs for positioning and eccentric control with all strengthening exercises.  [name] continues to be unable to perform theraband ER exercises due to excessive weakness.  [name] would benefit from further skilled [name] to increase UE and scapular strength for greater functional mobility.     Rehab Potential Good   Clinical Impairments Affecting Rehab Potential Positive: young, motivated     Negative: chronicity, n/t, difficulty diagnosing, proximal weakness     Clinical impression: evolving due to chronicity and difficulty diagnosing    [name] Frequency 1x / week   [name] Duration 6 weeks   [name] Treatment/Interventions Cryotherapy;Electrical Stimulation;Ultrasound;Traction;Moist Heat;Functional mobility training;Therapeutic activities;Therapeutic exercise;Balance training;Passive range of motion;Patient/family education;Manual techniques;Neuromuscular re-education;Energy conservation   [name] Next Visit Plan lat machine, row machine, tricpes, biceps, chest press in gym, overhead press    [name] Home Exercise Plan  added thoracic extensions and seated extensions over chair       Patient will benefit from skilled therapeutic intervention in order to improve the following deficits and impairments:  Decreased activity tolerance, Decreased range of motion, Hypomobility, Decreased strength, Decreased mobility, Pain, Postural dysfunction, Impaired UE functional use, Impaired flexibility  Visit Diagnosis: Muscle weakness (generalized)  Neck stiffness  Other lack of coordination     Problem List Patient Active Problem List   Diagnosis Date Noted  . ALS (amyotrophic lateral sclerosis) (HCC) 04/23/2016  . Cervical spinal stenosis 04/23/2016  . History of colon polyps 01/04/2016  .  Diabetes mellitus without complication (HCC) 10/06/2015  . Hypertension 10/06/2015  . Personal history of colonic polyps   . Personal history of methicillin resistant Staphylococcus aureus 03/31/2013  . History of methicillin resistant Staphylococcus aureus infection 03/31/2013  . HLD (hyperlipidemia) 01/02/2007  . Allergic rhinitis 02/01/2006  . 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 Acid reflux 06/08/2002  . Gastro-esophageal reflux disease without esophagitis 06/08/2002   Connor [name], Connor [name]  This entire session was performed under direct supervision and direction of a licensed therapist/therapist assistant . I have personally read, edited and approve of the note as written.   Connor [name],Connor [name], Connor [name] 07/19/2016, 5:41 PM  [place] [facility] MAIN [facility] SERVICES [address] [place], [place], [address] Phone: [phone]   Fax:  [phone]  Name: Connor [name] MRN: [id number] Date of Birth: February 26, 1961

## 2016-07-24 ENCOUNTER — Encounter: Payer: Self-pay | Admitting: Physical Therapy

## 2016-07-24 ENCOUNTER — Ambulatory Visit: Payer: BLUE CROSS/BLUE SHIELD | Attending: Neurology | Admitting: Physical Therapy

## 2016-07-24 DIAGNOSIS — R29898 Other symptoms and signs involving the musculoskeletal system: Secondary | ICD-10-CM

## 2016-07-24 DIAGNOSIS — M6281 Muscle weakness (generalized): Secondary | ICD-10-CM

## 2016-07-24 DIAGNOSIS — R278 Other lack of coordination: Secondary | ICD-10-CM

## 2016-07-24 DIAGNOSIS — M436 Torticollis: Secondary | ICD-10-CM | POA: Diagnosis present

## 2016-07-24 NOTE — Therapy (Signed)
[place] [facility] MAIN [facility] SERVICES [address] [place], [place], [address] Phone: [phone]   Fax:  [phone]  Physical Therapy Treatment  Patient Details  Name: Connor [name] MRN: [id number] Date of Birth: 1961-07-28 Referring Provider: Dr. Sherryll Burger  Encounter Date: 07/24/2016      PT End of Session - 07/24/16 0927    Visit Number 5   Number of Visits 7   Date for PT Re-Evaluation 08/07/16   PT Start Time 0832   PT Stop Time 0920   PT Time Calculation (min) 48 min      Past Medical History:  Diagnosis Date  . Diabetes mellitus without complication (HCC)    controlled  . GERD (gastroesophageal reflux disease)   . Hyperlipidemia   . Hypertension    controlled    Past Surgical History:  Procedure Laterality Date  . COLONOSCOPY WITH PROPOFOL N/A 08/30/2015   Procedure: COLONOSCOPY WITH PROPOFOL;  Surgeon: Midge Minium, MD;  Location: [facility] ENDOSCOPY;  Service: Endoscopy;  Laterality: N/A;    There were no vitals filed for this visit.      Subjective Assessment - 07/24/16 0922    Subjective Pt reports that he has been compliant with his HEP and that it has helped with his thoracic spine.  Pt reports 8/10 soreness in thoracic spine today.     Pertinent History Personal factors affecting rehab: chronicity, diagnosis difficulty, deferred spinal fusion surgery, weakness proximally    Limitations Lifting   How long can you sit comfortably? n/a    How long can you stand comfortably? n/a    How long can you walk comfortably? n/a    Diagnostic tests n/a    Patient Stated Goals get his UE motion back    Currently in Pain? Yes   Pain Score 8    Pain Location Back   Pain Orientation Mid   Pain Descriptors / Indicators Sore       Treatment Prone central PAs to T2-T10 grade 3-4, 3 bouts x 20 seconds, for 10 mins, pt reported decreased thoracic soreness and increased looseness after manual  Prone press ups, 3 sets x 10 reps, min VCs to  keep hips on the table throughout and relax cervical spine  Lat machine, 2 sets x 10 reps, level 3, min VCs for eccentric control and greater ROM  Mid row machine, 2 sets x 10 reps, level 3, min tactile cues for scapular retraction throughout  Overhand grip row machine, 2 sets x 10 reps, level 3, min VCs for scapular retraction and eccentric control  Tricep pull down, 2 sets x 10 reps, #17, min VCs for decreased trunk flexion  Single arm mid row on cable machine, 2 sets x 10 reps, BUEs, #7lbs, min VCs for core activation and to decrease trunk rotation  Low row cable machine single arm, 2 sets x 10 reps, BUEs, #7.5lbs, min VCs to decrease trunk rotation                           PT Education - 07/24/16 0927    Education provided Yes   Education Details HEP for thoracic spine, prone press ups    Person(s) Educated Patient   Methods Explanation;Demonstration;Verbal cues   Comprehension Verbalized understanding;Returned demonstration;Verbal cues required             PT Long Term Goals - 06/26/16 1306      PT LONG TERM GOAL #1  Title Pt will be independent in HEP to improve his overall strength for more functional mobility.     Time 6   Period Weeks   Status New     PT LONG TERM GOAL #2   Title Pt will increase BUE shoulder strength to 3/5 to allow pt to reach across midline to bedside table, into fridge etc to promote greater functional mobility.     Time 6   Status New     PT LONG TERM GOAL #3   Title Pt will report worst shoulder/neck pain of 2/10 to allow for full ability to perform ADLs   Time 6   Period Weeks     PT LONG TERM GOAL #4   Title Pt will increase BUE shoulder flexion ROM >80 degrees, abduction >80 degrees for more functional mobility and ease with driving and dressing.     Time 6   Period Weeks   Status New     PT LONG TERM GOAL #5   Title Pt will self report decreased quick DASH score of  >8 points to demonstrate self reported lower  disability.     Time 6   Period Weeks   Status New               Plan - 07/24/16 1610    Clinical Impression Statement Pt continues to report 8/10 constant thoracic soreness that was relieved short term by prone press ups.  Pt responded well central prone PAs to thoracic spine with reported increase in looseness and decreased soreness.  Reeducted on prone press ups and thoracic extensions for HEP to maintain looseness.  Pt was able to progress on lat machine to level 3 and tricep pull down to 17lbs.  Pt continues to require min VCs for increased scapular retraction and eccentrinc control with upper back and shoulder strengthening but performed all exercises with greater control and ROM today.  He would continue to benefit from further skilled PT to increase UE strength, ROM and decreased thoracic soreness for greater functional mobility.     Rehab Potential Good   Clinical Impairments Affecting Rehab Potential Positive: young, motivated     Negative: chronicity, n/t, difficulty diagnosing, proximal weakness     Clinical impression: evolving due to chronicity and difficulty diagnosing    PT Frequency 1x / week   PT Duration 6 weeks   PT Treatment/Interventions Cryotherapy;Electrical Stimulation;Ultrasound;Traction;Moist Heat;Functional mobility training;Therapeutic activities;Therapeutic exercise;Balance training;Passive range of motion;Patient/family education;Manual techniques;Neuromuscular re-education;Energy conservation   PT Next Visit Plan bicep curl, lat machine, row machine, lawnmovers, tricep pull down    PT Home Exercise Plan added thoracic extensions and seated extensions over chair, prone press ups       Patient will benefit from skilled therapeutic intervention in order to improve the following deficits and impairments:  Decreased activity tolerance, Decreased range of motion, Hypomobility, Decreased strength, Decreased mobility, Pain, Postural dysfunction, Impaired UE  functional use, Impaired flexibility  Visit Diagnosis: Muscle weakness (generalized)  Neck stiffness  Other lack of coordination  Weakness of both upper extremities     Problem List Patient Active Problem List   Diagnosis Date Noted  . ALS (amyotrophic lateral sclerosis) (HCC) 04/23/2016  . Cervical spinal stenosis 04/23/2016  . History of colon polyps 01/04/2016  . Diabetes mellitus without complication (HCC) 10/06/2015  . Hypertension 10/06/2015  . Personal history of colonic polyps   . Personal history of methicillin resistant Staphylococcus aureus 03/31/2013  . History of methicillin resistant Staphylococcus aureus infection 03/31/2013  .  HLD (hyperlipidemia) 01/02/2007  . Allergic rhinitis 02/01/2006  . 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 Acid reflux 06/08/2002  . Gastro-esophageal reflux disease without esophagitis 06/08/2002   Waldon Reiningaylor Bettey Muraoka, SPT  This entire session was performed under direct supervision and direction of a licensed therapist/therapist assistant . I have personally read, edited and approve of the note as written.  Trotter,Margaret PT, DPT 07/24/2016, 11:15 AM  [place] [facility] [address] , [place], [address] Phone: [phone]   Fax:  [phone]  Name: Connor [name] MRN: [id number] Date of Birth: 04/01/1961

## 2016-07-30 ENCOUNTER — Ambulatory Visit: Payer: BLUE CROSS/BLUE SHIELD | Admitting: Physical Therapy

## 2016-08-01 ENCOUNTER — Ambulatory Visit: Payer: BLUE CROSS/BLUE SHIELD | Admitting: Physical Therapy

## 2016-08-01 DIAGNOSIS — M436 Torticollis: Secondary | ICD-10-CM

## 2016-08-01 DIAGNOSIS — M6281 Muscle weakness (generalized): Secondary | ICD-10-CM | POA: Diagnosis not present

## 2016-08-01 DIAGNOSIS — R278 Other lack of coordination: Secondary | ICD-10-CM

## 2016-08-01 NOTE — Therapy (Signed)
[place] [facility] MAIN [facility] SERVICES [address] [place], [place], [address] Phone: [phone]   Fax:  [phone]  Physical Therapy Treatment  Patient Details  Name: Connor [name] MRN: [id number] Date of Birth: [birth year] Referring Provider: Dr. Sherryll Burger  Encounter Date: 08/01/2016      PT End of Session - 08/01/16 1513    Visit Number 6   Number of Visits 7   Date for PT Re-Evaluation 08/07/16   PT Start Time 1425   PT Stop Time 1513   PT Time Calculation (min) 48 min   Activity Tolerance Patient tolerated treatment well;No increased pain   Behavior During Therapy WFL for tasks assessed/performed      Past Medical History:  Diagnosis Date  . Diabetes mellitus without complication (HCC)    controlled  . GERD (gastroesophageal reflux disease)   . Hyperlipidemia   . Hypertension    controlled    Past Surgical History:  Procedure Laterality Date  . COLONOSCOPY WITH PROPOFOL N/A 08/30/2015   Procedure: COLONOSCOPY WITH PROPOFOL;  Surgeon: Midge Minium, MD;  Location: [facility] ENDOSCOPY;  Service: Endoscopy;  Laterality: N/A;    There were no vitals filed for this visit.      Subjective Assessment - 08/01/16 1428    Subjective Pt reports he went to the beach this weekend and feels stiffer because he laid around all weekend.     Pertinent History Personal factors affecting rehab: chronicity, diagnosis difficulty, deferred spinal fusion surgery, weakness proximally    Limitations Lifting   How long can you sit comfortably? n/a    How long can you stand comfortably? n/a    How long can you walk comfortably? n/a    Diagnostic tests n/a    Patient Stated Goals get his UE motion back    Currently in Pain? Yes   Pain Score 5    Pain Location Back   Pain Orientation Mid   Pain Descriptors / Indicators Sore     Treatment Prone central PAs to thoracic spine T2-T11 to decrease stiffness and provide pain relief, 3 bouts x 20 seconds each  segment grade 4, pt reported low back pain decreased to 3/10 after manual  (Manual 20 mins total)  Prone Press ups on elbows, 3 sets x 10 reps, min VCs for initial positioning and frequent reminders to keep hips down,  Thoracic extensions over half bolster in supine, 2 sets x 10 reps, min VCs for technique and placement of half bolster   Mid rows on machine level 3, 2 sets x 10 reps, min VCs for hand positioning and increased scapular retraction  Wide grip rows on machine, level 3, 2 sets x 10 reps, min VCs for eccentric control throughout  Lat pull down on hoist machine, level 3, 2 sets x 10 reps, min VCs to increase pull down and upright posture throughout  Tricep pull down on cable machine, 2 sets x 10 reps, #20, min VCs for core activation and upright posture  Bicep curl with #3.5lb wand, 2 sets x 10 reps, min VCs to decrease compensation with momentum  Chest press with overhead flexion, #2.5, 2 sets x 10 reps, min tacitle cues for technique, min VCs to keep arms straight throughout  Manual stretching for shoulder flexion and abduction bilaterally for 3 mins total, pt reports decrease in bilateral shoulder tightness and L shoulder pain after stretching Inferior mobs to bilateral shoulders at 90/90 position for 3 bouts x 30 seconds each shoulder for  pain relief and decreased stiffness                             PT Education - 08/01/16 1512    Education provided Yes   Education Details importance of completing HEP and stretching    Person(s) Educated Patient   Methods Explanation;Demonstration;Verbal cues   Comprehension Verbalized understanding;Returned demonstration;Verbal cues required             PT Long Term Goals - 06/26/16 1306      PT LONG TERM GOAL #1   Title Pt will be independent in HEP to improve his overall strength for more functional mobility.     Time 6   Period Weeks   Status New     PT LONG TERM GOAL #2   Title Pt will increase BUE  shoulder strength to 3/5 to allow pt to reach across midline to bedside table, into fridge etc to promote greater functional mobility.     Time 6   Status New     PT LONG TERM GOAL #3   Title Pt will report worst shoulder/neck pain of 2/10 to allow for full ability to perform ADLs   Time 6   Period Weeks     PT LONG TERM GOAL #4   Title Pt will increase BUE shoulder flexion ROM >80 degrees, abduction >80 degrees for more functional mobility and ease with driving and dressing.     Time 6   Period Weeks   Status New     PT LONG TERM GOAL #5   Title Pt will self report decreased quick DASH score of  >8 points to demonstrate self reported lower disability.     Time 6   Period Weeks   Status New               Plan - 08/01/16 1514    Clinical Impression Statement Pt reports 5/10 mid back stiffness at beginning of session which decreased to 3/10 after manual prone PAs extension based treatment.  Encouraged continuation of HEP at home and importance of carry over for better outcomes.  Continued strengthening program in gym, pt demonstrated better technique with lat pull down and rows and continues to require min VCs for eccentric control and increased scapular retraction.  Initiated bicep curl with #3.5 wand, pt required min VCs for decreased momentum compensation.  He would continue to benefit from continued PT to increase his shoulder strength, decrease low back pain for greater functional mobility.     Rehab Potential Good   Clinical Impairments Affecting Rehab Potential Positive: young, motivated     Negative: chronicity, n/t, difficulty diagnosing, proximal weakness     Clinical impression: evolving due to chronicity and difficulty diagnosing    PT Frequency 1x / week   PT Duration 6 weeks   PT Treatment/Interventions Cryotherapy;Electrical Stimulation;Ultrasound;Traction;Moist Heat;Functional mobility training;Therapeutic activities;Therapeutic exercise;Balance training;Passive  range of motion;Patient/family education;Manual techniques;Neuromuscular re-education;Energy conservation   PT Next Visit Plan check on amount of visits, lawn mowers, shoulder flexion, shoudler mobs    PT Home Exercise Plan added thoracic extensions and seated extensions over chair, prone press ups       Patient will benefit from skilled therapeutic intervention in order to improve the following deficits and impairments:  Decreased activity tolerance, Decreased range of motion, Hypomobility, Decreased strength, Decreased mobility, Pain, Postural dysfunction, Impaired UE functional use, Impaired flexibility  Visit Diagnosis: Muscle weakness (generalized)  Neck stiffness  Other lack of coordination     Problem List Patient Active Problem List   Diagnosis Date Noted  . ALS (amyotrophic lateral sclerosis) (HCC) 04/23/2016  . Cervical spinal stenosis 04/23/2016  . History of colon polyps 01/04/2016  . Diabetes mellitus without complication (HCC) 10/06/2015  . Hypertension 10/06/2015  . Personal history of colonic polyps   . Personal history of methicillin resistant Staphylococcus aureus 03/31/2013  . History of methicillin resistant Staphylococcus aureus infection 03/31/2013  . HLD (hyperlipidemia) 01/02/2007  . Allergic rhinitis 02/01/2006  . 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 Acid reflux 06/08/2002  . Gastro-esophageal reflux disease without esophagitis 06/08/2002   Waldon Reining, SPT  This entire session was performed under direct supervision and direction of a licensed therapist/therapist assistant . I have personally read, edited and approve of the note as written.  Trotter,Margaret  PT, DPT 08/02/2016, 3:24 PM   [place] [facility] MAIN [facility] SERVICES [address] [place], [place], [address] Phone: [phone]   Fax:  [phone]  Name: Connor [name] MRN: [id number] Date of Birth: 1961/01/31

## 2016-08-06 ENCOUNTER — Ambulatory Visit: Payer: BLUE CROSS/BLUE SHIELD | Admitting: Physical Therapy

## 2016-08-10 ENCOUNTER — Ambulatory Visit (INDEPENDENT_AMBULATORY_CARE_PROVIDER_SITE_OTHER): Payer: BLUE CROSS/BLUE SHIELD

## 2016-08-10 DIAGNOSIS — Z23 Encounter for immunization: Secondary | ICD-10-CM

## 2016-08-13 ENCOUNTER — Encounter: Payer: Self-pay | Admitting: Physical Therapy

## 2016-08-13 ENCOUNTER — Ambulatory Visit: Payer: BLUE CROSS/BLUE SHIELD | Admitting: Physical Therapy

## 2016-08-13 DIAGNOSIS — M6281 Muscle weakness (generalized): Secondary | ICD-10-CM

## 2016-08-13 DIAGNOSIS — R278 Other lack of coordination: Secondary | ICD-10-CM

## 2016-08-14 ENCOUNTER — Other Ambulatory Visit: Payer: Self-pay | Admitting: Family Medicine

## 2016-08-14 DIAGNOSIS — M4802 Spinal stenosis, cervical region: Secondary | ICD-10-CM

## 2016-08-14 NOTE — Therapy (Signed)
[place] MAIN [facility] SERVICES [address] [place], [place], [address] Phone: [phone]   Fax:  [phone]  Physical Therapy Treatment/Progress Note  Patient Details  Name: Connor [name] MRN: [id number] Date of Birth: 1961/10/08 Referring Provider: Dr. Manuella Ghazi  Encounter Date: 08/13/2016      PT End of Session - 08/13/16 1752    Visit Number 7   Number of Visits 13   Date for PT Re-Evaluation 09/24/16   PT Start Time 1027   PT Stop Time 1600   PT Time Calculation (min) 45 min   Activity Tolerance Patient tolerated treatment well;No increased pain   Behavior During Therapy WFL for tasks assessed/performed      Past Medical History:  Diagnosis Date  . Diabetes mellitus without complication ([place])    controlled  . GERD (gastroesophageal reflux disease)   . Hyperlipidemia   . Hypertension    controlled    Past Surgical History:  Procedure Laterality Date  . COLONOSCOPY WITH PROPOFOL N/A 08/30/2015   Procedure: COLONOSCOPY WITH PROPOFOL;  Surgeon: Lucilla Lame, MD;  Location: [facility] ENDOSCOPY;  Service: Endoscopy;  Laterality: N/A;    There were no vitals filed for this visit.      Subjective Assessment - 08/14/16 1635    Subjective Pt reports 2/10 pain in BUEs and consistent numbness/tingling in BUEs.     Currently in Pain? Yes   Pain Score 5    Pain Location Back   Pain Orientation Mid   Pain Descriptors / Indicators Sore   Pain Type Chronic pain            [facility] PT Assessment - 08/14/16 0001      Observation/Other Assessments   Quick DASH  4.5%, lower the score the less the disability, unrealistic percieved disability      AROM   Right Shoulder Extension 15 Degrees   Right Shoulder Flexion 40 Degrees   Right Shoulder ABduction 40 Degrees   Left Shoulder Extension 20 Degrees   Left Shoulder Flexion 42 Degrees   Left Shoulder ABduction 30 Degrees     Strength   Right Shoulder Flexion 3-/5   Right Shoulder  Extension 4-/5   Right Shoulder ABduction 3-/5   Left Shoulder Flexion 3-/5   Left Shoulder Extension 4-/5   Left Shoulder ABduction 3-/5   Right Elbow Flexion 4/5   Right Elbow Extension 4/5   Left Elbow Flexion 4/5   Left Elbow Extension 4+/5     Treatment Reassessed Quick Dash for progress, pt self reported 4.5% today compared to previous attempt of 9.09% indicating less percieved disability  Reassessed UE shoulder ROM for progress, pt continues to demonstrate deficits and is not able to achieve more than 40 degrees with shoulder abduction/extension/flexion, has not made progress since last check  Reassessed UE strength for progress, pt demonstrated grossly 3/5 with no significant changes since previous session  Manual stretching to bilateral shoulders passive flexion, abduction x 5 mins for pain relief and greater ROM Manual posterior and inferior glides to bilateral shoulders 3 bouts x 30 seconds each in 90/90 position to increase joint mobility, grade 3, pt reported decreased shoulder stiffness after manual  Performed prone central PAs to T3-T12, 3 bouts x 20 seconds each grade IV for pain relief, pt reported decreased numbness/tingling and tightness after manual x 10 mins Prone Press ups on elbows x 10 reps to reeducate for home program, pt required min VCs to relax cervical spine and increase thoracic  extension  AAROM wand exercises in supine for greater ROM x 10 BUEs shoulder flexion, abduction and x 10 overhead press with straight arm extension, min VCs for greater ROM and hand placement to prevent shoulder impingement bilaterally                         PT Education - 08/14/16 1646    Education provided Yes   Education Details HEP    Person(s) Educated Patient   Methods Explanation;Demonstration;Verbal cues   Comprehension Returned demonstration;Verbal cues required;Verbalized understanding             PT Long Term Goals - 08/14/16 1742      PT LONG  TERM GOAL #1   Title Pt will be independent in HEP to improve his overall strength for more functional mobility.     Time 6   Period Weeks   Status On-going     PT LONG TERM GOAL #2   Title Pt will increase BUE shoulder strength to 3/5 to allow pt to reach across midline to bedside table, into fridge etc to promote greater functional mobility.     Time 6   Period Weeks   Status On-going     PT LONG TERM GOAL #3   Title Pt will report worst shoulder/neck pain of 2/10 to allow for full ability to perform ADLs   Time 6   Period Weeks   Status On-going     PT LONG TERM GOAL #4   Title Pt will increase BUE shoulder flexion ROM >80 degrees, abduction >80 degrees for more functional mobility and ease with driving and dressing.     Time 6   Period Weeks   Status Partially Met     PT LONG TERM GOAL #5   Title Pt will self report decreased quick DASH score of  >8 points to demonstrate self reported lower disability.     Time 6   Period Weeks   Status Partially Met               Plan - 08/14/16 1737    Clinical Impression Statement Pt continues to report consistent numbness/tingling in BUEs and upper back stiffness.  Pt self reported Quick Dash of 4.5% compared to 9.09% indicating less percieved disability.  Pt continues to be limited with active shoulder ROM and unable to perform any shoulder motion greater than 40 degrees due to weakness.  Pt continues to demonstrate grossly 3+/5 UE strength bilaterally with most difficulty in shoulder flexion and abduction.  Pt requires cueing each session in order to remain compliant with HEP.  He responded well to thoracic mobilizations and prone press ups for upper back pain relief.  Wand exercises for AAROM were reemphasized today to work towards greater AROM.  Pt reported no numbness/tingling after session was over. He would continue to benefit from more PT to work towards more functoinal shoulder ROM and strength for greater functional mobility.      Rehab Potential Good   Clinical Impairments Affecting Rehab Potential Positive: young, motivated     Negative: chronicity, n/t, difficulty diagnosing, proximal weakness     Clinical impression: evolving due to chronicity and difficulty diagnosing    PT Frequency 1x / week   PT Duration 6 weeks   PT Treatment/Interventions Cryotherapy;Electrical Stimulation;Ultrasound;Traction;Moist Heat;Functional mobility training;Therapeutic activities;Therapeutic exercise;Balance training;Passive range of motion;Patient/family education;Manual techniques;Neuromuscular re-education;Energy conservation   PT Next Visit Plan check on amount of visits, lawn mowers, shoulder flexion,  shoudler mobs, wand exercises    PT Home Exercise Plan added thoracic extensions and seated extensions over chair, prone press ups       Patient will benefit from skilled therapeutic intervention in order to improve the following deficits and impairments:  Decreased activity tolerance, Decreased range of motion, Hypomobility, Decreased strength, Decreased mobility, Pain, Postural dysfunction, Impaired UE functional use, Impaired flexibility  Visit Diagnosis: Muscle weakness (generalized)  Other lack of coordination  Neck stiffness  Weakness of both upper extremities     Problem List Patient Active Problem List   Diagnosis Date Noted  . ALS (amyotrophic lateral sclerosis) ([place]) 04/23/2016  . Cervical spinal stenosis 04/23/2016  . History of colon polyps 01/04/2016  . Diabetes mellitus without complication ([place]) 97/02/4924  . Hypertension 10/06/2015  . Personal history of colonic polyps   . Personal history of methicillin resistant Staphylococcus aureus 03/31/2013  . History of methicillin resistant Staphylococcus aureus infection 03/31/2013  . HLD (hyperlipidemia) 01/02/2007  . Allergic rhinitis 02/01/2006  . 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 Acid reflux 06/08/2002  . Gastro-esophageal  reflux disease without esophagitis 06/08/2002   Stacy Gardner, SPT  This entire session was performed under direct supervision and direction of a licensed therapist/therapist assistant . I have personally read, edited and approve of the note as written.  Hillis Range, PT, DPT, [phone] 08/15/16 9:17 AM   [place] MAIN [facility] SERVICES [address] [place], [place], [address] Phone: [phone]   Fax:  [phone]  Name: Connor [name] MRN: [id number] Date of Birth: 01/10/1961

## 2016-08-21 ENCOUNTER — Ambulatory Visit: Payer: BLUE CROSS/BLUE SHIELD | Attending: Neurology | Admitting: Physical Therapy

## 2016-08-21 DIAGNOSIS — R278 Other lack of coordination: Secondary | ICD-10-CM | POA: Insufficient documentation

## 2016-08-21 DIAGNOSIS — M436 Torticollis: Secondary | ICD-10-CM | POA: Diagnosis not present

## 2016-08-21 DIAGNOSIS — M6281 Muscle weakness (generalized): Secondary | ICD-10-CM | POA: Diagnosis not present

## 2016-08-21 NOTE — Therapy (Signed)
[place] MAIN [facility] SERVICES [address] [place], [place], [address] Phone: [phone]   Fax:  [phone]  Physical Therapy Treatment  Patient Details  Name: Connor [name] MRN: [id number] Date of Birth: 03/27/1961 Referring Provider: Dr. Manuella Ghazi  Encounter Date: 08/21/2016      PT End of Session - 08/21/16 1147    Visit Number 8   Number of Visits 13   Date for PT Re-Evaluation 09/24/16   PT Start Time 1100   PT Stop Time 1143   PT Time Calculation (min) 43 min   Activity Tolerance Patient tolerated treatment well;No increased pain   Behavior During Therapy WFL for tasks assessed/performed      Past Medical History:  Diagnosis Date  . Diabetes mellitus without complication ([place])    controlled  . GERD (gastroesophageal reflux disease)   . Hyperlipidemia   . Hypertension    controlled    Past Surgical History:  Procedure Laterality Date  . COLONOSCOPY WITH PROPOFOL N/A 08/30/2015   Procedure: COLONOSCOPY WITH PROPOFOL;  Surgeon: Lucilla Lame, MD;  Location: [facility] ENDOSCOPY;  Service: Endoscopy;  Laterality: N/A;    There were no vitals filed for this visit.       Subjective Assessment - 08/21/16 1111    Subjective Pt reports 5/10 BUEs numbness/tingling and similar mid back stiffness as last session.     Currently in Pain? Yes   Pain Score 5    Pain Location Back   Pain Orientation Mid   Pain Descriptors / Indicators Aching       Treatment (Manual total 23 mins)  Central PAs to thoracic spine T1-T12, grade 3,  2 bouts x 20 seconds each segment for hypomobility, pt reported decreased in thoracic stiffness after manual  Prone Press ups on elbows, 2 sets x 10 reps, min VCs for positioning, min VCs to relax cervical spine Manual stretching to B shoulder flexion/abduction x 5 mins in supine  to assess motion and decrease stiffness  Inferior and posterior mobs grade 3 to B shoulders, 3 bouts x 20 seconds each in 90/90  position, pt demonstrated increased passive ROM after and decreased stiffness after mobilizations  AAROM wand exercises, flexion, abduction and overhead bilateral flexion x 10 reps each arm, min tactile cues for hand positioning and increased ROM  Lat pull down on cable machine, level 3, 2 sets x 10 reps each, min A to bring bar down to shoulder height so that pt could position himself, min VCs for increased retraction  Wide grip mid row on cable machine, level 3, 2 sets x 10 reps, min A to bring handles within reach, min VCs for eccentric control  Mid row on cable machine, level 3, 2 sets x 10 reps, min A to bring handles within reach, min VCs for upright posture and increased scapular retraction  Tricep pull down on cable machine, #17, 2 sets x 10 reps, min VCs for positioning and posture as well as proper breathing technique                         PT Education - 08/21/16 1146    Education provided Yes   Education Details wand exercises for overhead flexion ROM    Person(s) Educated Patient   Methods Explanation;Demonstration;Verbal cues   Comprehension Verbalized understanding;Returned demonstration;Verbal cues required             PT Long Term Goals - 08/14/16 1742  PT LONG TERM GOAL #1   Title Pt will be independent in HEP to improve his overall strength for more functional mobility.     Time 6   Period Weeks   Status On-going     PT LONG TERM GOAL #2   Title Pt will increase BUE shoulder strength to 3/5 to allow pt to reach across midline to bedside table, into fridge etc to promote greater functional mobility.     Time 6   Period Weeks   Status On-going     PT LONG TERM GOAL #3   Title Pt will report worst shoulder/neck pain of 2/10 to allow for full ability to perform ADLs   Time 6   Period Weeks   Status On-going     PT LONG TERM GOAL #4   Title Pt will increase BUE shoulder flexion ROM >80 degrees, abduction >80 degrees for more functional  mobility and ease with driving and dressing.     Time 6   Period Weeks   Status Partially Met     PT LONG TERM GOAL #5   Title Pt will self report decreased quick DASH score of  >8 points to demonstrate self reported lower disability.     Time 6   Period Weeks   Status Partially Met               Plan - 08/21/16 1148    Clinical Impression Statement Pt contines to require frequent verbal cues for completing HEP.  Pt reported 5/10 B shoulder numbness/tingling at beginning of session that was reduced by end of session.  Passive bilateral shoulder ROM improved after manual stretching and inferior and posterior mobs.  Continued posterior shoulder girdle strengthening with lat pull downs and rows in with various grips.  Pt is able to perform with proper form but requires min A to bring the bar within reach.  Pt would continue to benefit from further skilled PT to increase active shoulder ROM bilaterally and strength for more functional movement.     Rehab Potential Good   Clinical Impairments Affecting Rehab Potential Positive: young, motivated     Negative: chronicity, n/t, difficulty diagnosing, proximal weakness     Clinical impression: evolving due to chronicity and difficulty diagnosing    PT Frequency 1x / week   PT Duration 6 weeks   PT Treatment/Interventions Cryotherapy;Electrical Stimulation;Ultrasound;Traction;Moist Heat;Functional mobility training;Therapeutic activities;Therapeutic exercise;Balance training;Passive range of motion;Patient/family education;Manual techniques;Neuromuscular re-education;Energy conservation   PT Next Visit Plan check on amount of visits, lawn mowers, shoulder flexion, shoudler mobs, wand exercises    PT Home Exercise Plan added thoracic extensions and seated extensions over chair, prone press ups       Patient will benefit from skilled therapeutic intervention in order to improve the following deficits and impairments:  Decreased activity  tolerance, Decreased range of motion, Hypomobility, Decreased strength, Decreased mobility, Pain, Postural dysfunction, Impaired UE functional use, Impaired flexibility  Visit Diagnosis: Muscle weakness (generalized)  Other lack of coordination  Neck stiffness     Problem List Patient Active Problem List   Diagnosis Date Noted  . ALS (amyotrophic lateral sclerosis) ([place]) 04/23/2016  . Cervical spinal stenosis 04/23/2016  . History of colon polyps 01/04/2016  . Diabetes mellitus without complication ([place]) 47/07/6282  . Hypertension 10/06/2015  . Personal history of colonic polyps   . Personal history of methicillin resistant Staphylococcus aureus 03/31/2013  . History of methicillin resistant Staphylococcus aureus infection 03/31/2013  . HLD (hyperlipidemia) 01/02/2007  .  Allergic rhinitis 02/01/2006  . 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 Acid reflux 06/08/2002  . Gastro-esophageal reflux disease without esophagitis 06/08/2002   Stacy Gardner, SPT  This entire session was performed under direct supervision and direction of a licensed therapist/therapist assistant . I have personally read, edited and approve of the note as written.  Trotter,Margaret  PT, DPT 08/21/2016, 1:15 PM  [place] MAIN [facility] SERVICES [address] [place], [place], [address] Phone: [phone]   Fax:  [phone]  Name: Connor [name] MRN: [id number] Date of Birth: 20-Jan-1961

## 2016-08-27 ENCOUNTER — Ambulatory Visit: Payer: BLUE CROSS/BLUE SHIELD | Admitting: Physical Therapy

## 2016-08-27 ENCOUNTER — Encounter: Payer: Self-pay | Admitting: Physical Therapy

## 2016-08-27 DIAGNOSIS — R278 Other lack of coordination: Secondary | ICD-10-CM | POA: Diagnosis not present

## 2016-08-27 DIAGNOSIS — M6281 Muscle weakness (generalized): Secondary | ICD-10-CM | POA: Diagnosis not present

## 2016-08-27 DIAGNOSIS — M436 Torticollis: Secondary | ICD-10-CM | POA: Diagnosis not present

## 2016-08-27 NOTE — Therapy (Signed)
[place] MAIN [facility] SERVICES [address] [place], [place], [address] Phone: [phone]   Fax:  [phone]  Physical Therapy Treatment  Patient Details  Name: BOOKER BHATNAGAR MRN: [id number] Date of Birth: [birth year] Referring Provider: Dr. Manuella Ghazi  Encounter Date: 08/27/2016      PT End of Session - 08/27/16 1136    Visit Number 9   Number of Visits 13   Date for PT Re-Evaluation 09/24/16   PT Start Time 1115   PT Stop Time 1200   PT Time Calculation (min) 45 min   Activity Tolerance Patient tolerated treatment well;No increased pain   Behavior During Therapy WFL for tasks assessed/performed      Past Medical History:  Diagnosis Date  . Diabetes mellitus without complication ([place])    controlled  . GERD (gastroesophageal reflux disease)   . Hyperlipidemia   . Hypertension    controlled    Past Surgical History:  Procedure Laterality Date  . COLONOSCOPY WITH PROPOFOL N/A 08/30/2015   Procedure: COLONOSCOPY WITH PROPOFOL;  Surgeon: Lucilla Lame, MD;  Location: [facility] ENDOSCOPY;  Service: Endoscopy;  Laterality: N/A;    There were no vitals filed for this visit.      Subjective Assessment - 08/27/16 1119    Subjective Pt reports 5/10 stiffness in thoracic spine and RUE numbness/tingling today.  He did report doing his HEP.     Currently in Pain? Yes   Pain Score 5    Pain Location Back   Pain Orientation Mid   Pain Descriptors / Indicators Aching   Pain Type Chronic pain        Treatment Prone central PAs to T1-T12, 2 bouts x 20 seconds each segment, grade 3, pt reported decreased in thoracic stiffness after prone PAs Childs pose stretch x 30 seconds, lat stretch in each direction x 30 seconds  Prone Press Ups on elbows, 2 sets x 10 reps,  UE ranger stretching 1 set x 10 reps forward shoulder flexion and abduction with BUEs, min VCs to reach outside typical range  Shoulder internal rotator ball squeezes with 3 second  isometric hold, min VCS to increase isometric hold and upright posture throughout Hoist machine  -Lat pull down, 2 sets x 10 reps on level 3, min VCs for increased scapular retraction and proper positioning   -Mid row, 2 sets x 10 reps on level 3, min VCs for upright posture throughout   -Wide row, 2 sets x 10 reps on level 3, min VCs for increased eccentric control   Tricep pull down on cable machine, 2 sets x 10 reps, #17, min VCs to keep back straight and increase eccentric control  Single arm mid row on cable machine, 1 set x 10 reps BUEs, #7, min VCs to keep upper body from rotating                            PT Education - 08/27/16 1135    Education provided Yes   Education Details HEP, increased thoracic stretching    Person(s) Educated Patient   Methods Explanation;Demonstration;Verbal cues   Comprehension Verbalized understanding;Returned demonstration;Verbal cues required             PT Long Term Goals - 08/14/16 1742      PT LONG TERM GOAL #1   Title Pt will be independent in HEP to improve his overall strength for more functional mobility.  Time 6   Period Weeks   Status On-going     PT LONG TERM GOAL #2   Title Pt will increase BUE shoulder strength to 3/5 to allow pt to reach across midline to bedside table, into fridge etc to promote greater functional mobility.     Time 6   Period Weeks   Status On-going     PT LONG TERM GOAL #3   Title Pt will report worst shoulder/neck pain of 2/10 to allow for full ability to perform ADLs   Time 6   Period Weeks   Status On-going     PT LONG TERM GOAL #4   Title Pt will increase BUE shoulder flexion ROM >80 degrees, abduction >80 degrees for more functional mobility and ease with driving and dressing.     Time 6   Period Weeks   Status Partially Met     PT LONG TERM GOAL #5   Title Pt will self report decreased quick DASH score of  >8 points to demonstrate self reported lower disability.      Time 6   Period Weeks   Status Partially Met               Plan - 08/27/16 1146    Clinical Impression Statement Pt continues to report 5/10 thoracic stiffness and RUE numbness/tingling.  Pt continues to find relief with thoracic prone central PAs to decreased thoracic tightness.  Provided further thoracic/lumbar stretches that can be added into HEP.  Pt demonstrated better form and control today with scapular and rotator cuff strengtheing on Hoist machine.  Pt continues to require min VCs to increase scapular retraction for further strengthening.  Pt reports decrease in thoracic stiffness and decrease in RUE numbness/tingling after session.  He reports that he has been doing his HEP at home and stretching for his back. He would benefit from further skilled PT to increase active shoulder ROM, decrease stiffness in order to increase functional mobility.     Rehab Potential Good   Clinical Impairments Affecting Rehab Potential Positive: young, motivated     Negative: chronicity, n/t, difficulty diagnosing, proximal weakness     Clinical impression: evolving due to chronicity and difficulty diagnosing    PT Frequency 1x / week   PT Duration 6 weeks   PT Treatment/Interventions Cryotherapy;Electrical Stimulation;Ultrasound;Traction;Moist Heat;Functional mobility training;Therapeutic activities;Therapeutic exercise;Balance training;Passive range of motion;Patient/family education;Manual techniques;Neuromuscular re-education;Energy conservation   PT Next Visit Plan check on amount of visits, lawn mowers, shoulder flexion, shoudler mobs, wand exercises    PT Home Exercise Plan added thoracic extensions and seated extensions over chair, prone press ups       Patient will benefit from skilled therapeutic intervention in order to improve the following deficits and impairments:  Decreased activity tolerance, Decreased range of motion, Hypomobility, Decreased strength, Decreased mobility, Pain,  Postural dysfunction, Impaired UE functional use, Impaired flexibility  Visit Diagnosis: Muscle weakness (generalized)  Neck stiffness     Problem List Patient Active Problem List   Diagnosis Date Noted  . ALS (amyotrophic lateral sclerosis) ([place]) 04/23/2016  . Cervical spinal stenosis 04/23/2016  . History of colon polyps 01/04/2016  . Diabetes mellitus without complication ([place]) 95/62/1308  . Hypertension 10/06/2015  . Personal history of colonic polyps   . Personal history of methicillin resistant Staphylococcus aureus 03/31/2013  . History of methicillin resistant Staphylococcus aureus infection 03/31/2013  . HLD (hyperlipidemia) 01/02/2007  . Allergic rhinitis 02/01/2006  . 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  Acid reflux 06/08/2002  . Gastro-esophageal reflux disease without esophagitis 06/08/2002   Stacy Gardner, SPT  This entire session was performed under direct supervision and direction of a licensed therapist/therapist assistant . I have personally read, edited and approve of the note as written.  Trotter,Margaret PT, DPT 08/27/2016, 1:35 PM  [place] MAIN [facility] SERVICES [address] [place], [place], [address] Phone: [phone]   Fax:  [phone]  Name: TYTUS STRAHLE MRN: [id number] Date of Birth: [birth year]

## 2016-08-29 DIAGNOSIS — G1221 Amyotrophic lateral sclerosis: Secondary | ICD-10-CM | POA: Diagnosis not present

## 2016-08-29 DIAGNOSIS — Z79899 Other long term (current) drug therapy: Secondary | ICD-10-CM | POA: Diagnosis not present

## 2016-08-29 DIAGNOSIS — E119 Type 2 diabetes mellitus without complications: Secondary | ICD-10-CM | POA: Diagnosis not present

## 2016-09-03 ENCOUNTER — Encounter: Payer: Self-pay | Admitting: Physical Therapy

## 2016-09-03 ENCOUNTER — Ambulatory Visit: Payer: BLUE CROSS/BLUE SHIELD | Admitting: Physical Therapy

## 2016-09-03 DIAGNOSIS — R278 Other lack of coordination: Secondary | ICD-10-CM | POA: Diagnosis not present

## 2016-09-03 DIAGNOSIS — M436 Torticollis: Secondary | ICD-10-CM | POA: Diagnosis present

## 2016-09-03 DIAGNOSIS — M6281 Muscle weakness (generalized): Secondary | ICD-10-CM | POA: Diagnosis not present

## 2016-09-03 NOTE — Therapy (Signed)
[place] MAIN [facility] SERVICES [address] [place], [place], [address] Phone: [phone]   Fax:  [phone]  Physical Therapy Treatment  Patient Details  Name: Connor [name] MRN: [id number] Date of Birth: Feb 09, 1961 Referring Provider: Dr. Manuella Ghazi  Encounter Date: 09/03/2016      PT End of Session - 09/03/16 1446    Visit Number 10   Number of Visits 13   Date for PT Re-Evaluation 09/24/16   PT Start Time 1430   PT Stop Time 1513   PT Time Calculation (min) 43 min   Activity Tolerance Patient tolerated treatment well;No increased pain   Behavior During Therapy WFL for tasks assessed/performed      Past Medical History:  Diagnosis Date  . Diabetes mellitus without complication ([place])    controlled  . GERD (gastroesophageal reflux disease)   . Hyperlipidemia   . Hypertension    controlled    Past Surgical History:  Procedure Laterality Date  . COLONOSCOPY WITH PROPOFOL N/A 08/30/2015   Procedure: COLONOSCOPY WITH PROPOFOL;  Surgeon: Lucilla Lame, MD;  Location: [facility] ENDOSCOPY;  Service: Endoscopy;  Laterality: N/A;    There were no vitals filed for this visit.      Subjective Assessment - 09/03/16 1428    Subjective Pt reports stiffness in thoracic spine and 5/10 numbness/tingling in BUEs.     Currently in Pain? Yes   Pain Score 5    Pain Location Arm   Pain Orientation Right   Pain Descriptors / Indicators Numbness;Tingling   Pain Type Chronic pain      Treatment Prone central PAs to T2-T12, grade 3 for pain relief and to decrease stiffness, 3 bouts x 20 seconds, pt reports that central PAs "relieve tension"  (10 mins manual total)  Prone press ups with overpressure from therapist, 2 sets x 10 reps, min VCs for increase thoracic extension and eccentric control  AAROM with wand, 1 set x 10 reps flexion and abduction with both R and L UE, min VCs for proper technique and proper breathing  Seated stability ball roll outs  for thoracic mobility and increased shoulder flexion, 2 sets x 10 reps, min VCs to relax cervical spine and increase shoulder flexion  Hoist machine             -Lat pull down, 2 sets x 10 reps on level 3, min VCs for upright posture and eccentric control              -Mid row, 2 sets x 10 reps on level 3, min VCs for increased scapular retraction              -Wide row, 2 sets x 10 reps on level 3, min VCs for proper breathing technique and scapular retraction  Tricep pull down on cable machine, 3 sets x 10 reps, #17, min VCs to keep back straight and proper form                             PT Education - 09/03/16 1445    Education provided Yes   Education Details AAROM at home, lumbar roll    Person(s) Educated Patient   Methods Explanation;Demonstration;Verbal cues   Comprehension Verbalized understanding;Returned demonstration;Verbal cues required             PT Long Term Goals - 08/14/16 1742      PT LONG TERM GOAL #1   Title  Pt will be independent in HEP to improve his overall strength for more functional mobility.     Time 6   Period Weeks   Status On-going     PT LONG TERM GOAL #2   Title Pt will increase BUE shoulder strength to 3/5 to allow pt to reach across midline to bedside table, into fridge etc to promote greater functional mobility.     Time 6   Period Weeks   Status On-going     PT LONG TERM GOAL #3   Title Pt will report worst shoulder/neck pain of 2/10 to allow for full ability to perform ADLs   Time 6   Period Weeks   Status On-going     PT LONG TERM GOAL #4   Title Pt will increase BUE shoulder flexion ROM >80 degrees, abduction >80 degrees for more functional mobility and ease with driving and dressing.     Time 6   Period Weeks   Status Partially Met     PT LONG TERM GOAL #5   Title Pt will self report decreased quick DASH score of  >8 points to demonstrate self reported lower disability.     Time 6   Period Weeks    Status Partially Met               Plan - 09/03/16 1524    Clinical Impression Statement Pt reported 5/10 numbness/tingling in RUE that was relieved by end of session.  Pt continues to benefit from prone central PAs to thoracic spine to decrease stiffness.  Reeducated on the importance of AAROM shoulder exercises at home to keep shoulder mobility and reduce stiffness.  Pt demonstrates improved control with scapular strenthening exercises on hoist machine requiring less verbal cues for eccentric control and posture.  Pt would benefit from further skilled PT to decrease numbness/tingling and increase shoulder mobility for more functional independence.     Rehab Potential Good   Clinical Impairments Affecting Rehab Potential Positive: young, motivated     Negative: chronicity, n/t, difficulty diagnosing, proximal weakness     Clinical impression: evolving due to chronicity and difficulty diagnosing    PT Frequency 1x / week   PT Duration 6 weeks   PT Treatment/Interventions Cryotherapy;Electrical Stimulation;Ultrasound;Traction;Moist Heat;Functional mobility training;Therapeutic activities;Therapeutic exercise;Balance training;Passive range of motion;Patient/family education;Manual techniques;Neuromuscular re-education;Energy conservation   PT Next Visit Plan check on amount of visits, lawn mowers, shoulder flexion, shoudler mobs, wand exercises    PT Home Exercise Plan added thoracic extensions and seated extensions over chair, prone press ups       Patient will benefit from skilled therapeutic intervention in order to improve the following deficits and impairments:  Decreased activity tolerance, Decreased range of motion, Hypomobility, Decreased strength, Decreased mobility, Pain, Postural dysfunction, Impaired UE functional use, Impaired flexibility  Visit Diagnosis: Muscle weakness (generalized)  Neck stiffness  Other lack of coordination     Problem List Patient Active Problem  List   Diagnosis Date Noted  . ALS (amyotrophic lateral sclerosis) ([facility]) 04/23/2016  . Cervical spinal stenosis 04/23/2016  . History of colon polyps 01/04/2016  . Diabetes mellitus without complication ([facility]) 10/06/2015  . Hypertension 10/06/2015  . Personal history of colonic polyps   . Personal history of methicillin resistant Staphylococcus aureus 03/31/2013  . History of methicillin resistant Staphylococcus aureus infection 03/31/2013  . HLD (hyperlipidemia) 01/02/2007  . Allergic rhinitis 02/01/2006  . 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 Acid reflux 06/08/2002  . Gastro-esophageal reflux   disease without esophagitis 06/08/2002   Taylor G[place]altney, SPT   This entire session was performed under direct supervision and direction of a licensed therapist/therapist assistant . I have personally read, edited and approve of the note as written.  Ashley Abashian PT, DPT 09/03/2016, 5:37 PM  [place] [facility] REHAB SERVICES [address] [place], [place], [address] Phone: [phone]   Fax:  [phone]  Name: Connor [name] MRN: [id number] Date of Birth: 02/27/1961    

## 2016-09-05 ENCOUNTER — Other Ambulatory Visit: Payer: Self-pay | Admitting: Family Medicine

## 2016-09-05 DIAGNOSIS — Z79899 Other long term (current) drug therapy: Secondary | ICD-10-CM | POA: Diagnosis not present

## 2016-09-05 DIAGNOSIS — E1169 Type 2 diabetes mellitus with other specified complication: Secondary | ICD-10-CM

## 2016-09-05 DIAGNOSIS — N521 Erectile dysfunction due to diseases classified elsewhere: Secondary | ICD-10-CM

## 2016-09-05 DIAGNOSIS — E119 Type 2 diabetes mellitus without complications: Secondary | ICD-10-CM

## 2016-09-05 MED ORDER — GLIPIZIDE ER 10 MG PO TB24: 20.0000 mg | ORAL_TABLET | Freq: Every day | ORAL | 1 refills | Status: DC

## 2016-09-05 MED ORDER — SILDENAFIL CITRATE 100 MG PO TABS: 100.0000 mg | ORAL_TABLET | ORAL | 1 refills | Status: DC | PRN

## 2016-09-06 ENCOUNTER — Other Ambulatory Visit: Payer: Self-pay | Admitting: Family Medicine

## 2016-09-06 DIAGNOSIS — E1169 Type 2 diabetes mellitus with other specified complication: Secondary | ICD-10-CM

## 2016-09-06 DIAGNOSIS — N521 Erectile dysfunction due to diseases classified elsewhere: Principal | ICD-10-CM

## 2016-09-06 MED ORDER — SILDENAFIL CITRATE 20 MG PO TABS: ORAL_TABLET | ORAL | 5 refills | Status: DC

## 2016-09-07 DIAGNOSIS — G1221 Amyotrophic lateral sclerosis: Secondary | ICD-10-CM | POA: Insufficient documentation

## 2016-09-13 ENCOUNTER — Ambulatory Visit: Payer: BLUE CROSS/BLUE SHIELD | Admitting: Physical Therapy

## 2016-09-13 ENCOUNTER — Encounter: Payer: Self-pay | Admitting: Physical Therapy

## 2016-09-13 DIAGNOSIS — M436 Torticollis: Secondary | ICD-10-CM

## 2016-09-13 DIAGNOSIS — M6281 Muscle weakness (generalized): Secondary | ICD-10-CM | POA: Diagnosis not present

## 2016-09-13 DIAGNOSIS — R278 Other lack of coordination: Secondary | ICD-10-CM

## 2016-09-13 NOTE — Therapy (Signed)
[place] MAIN [facility] SERVICES [address] [place], [place], [address] Phone: [phone]   Fax:  [phone]  Physical Therapy Treatment/Progress Note  Patient Details  Name: Connor [name] MRN: [id number] Date of Birth: 1961/05/26 Referring Provider: Dr. Manuella Ghazi  Encounter Date: 09/13/2016      PT End of Session - 09/13/16 1546    Visit Number 11   Number of Visits 17   Date for PT Re-Evaluation 10/11/16   PT Start Time 9937   PT Stop Time 1558   PT Time Calculation (min) 43 min   Activity Tolerance Patient tolerated treatment well;No increased pain   Behavior During Therapy WFL for tasks assessed/performed      Past Medical History:  Diagnosis Date  . Diabetes mellitus without complication ([place])    controlled  . GERD (gastroesophageal reflux disease)   . Hyperlipidemia   . Hypertension    controlled    Past Surgical History:  Procedure Laterality Date  . COLONOSCOPY WITH PROPOFOL N/A 08/30/2015   Procedure: COLONOSCOPY WITH PROPOFOL;  Surgeon: Lucilla Lame, MD;  Location: [facility] ENDOSCOPY;  Service: Endoscopy;  Laterality: N/A;    There were no vitals filed for this visit.      Subjective Assessment - 09/13/16 1519    Subjective Pt reports no new changes and reports compliance with HEP.  Pt reports 5/10 thoracic and UE pain/tingling today.    Currently in Pain? Yes   Pain Score 5    Pain Location Arm   Pain Orientation Right;Left   Pain Descriptors / Indicators Numbness;Tingling   Pain Type Chronic pain            [facility] PT Assessment - 09/13/16 0001      AROM   Right Shoulder Extension 15 Degrees   Right Shoulder Flexion 35 Degrees   Right Shoulder ABduction 35 Degrees   Left Shoulder Extension 20 Degrees   Left Shoulder Flexion 55 Degrees   Left Shoulder ABduction 20 Degrees     Strength   Right Shoulder Flexion 3-/5   Right Shoulder Extension 4-/5   Right Shoulder ABduction 3-/5   Left Shoulder Flexion  3-/5   Left Shoulder Extension 4-/5   Left Shoulder ABduction 3-/5   Right Elbow Flexion 4/5   Right Elbow Extension 4/5   Left Elbow Flexion 4/5   Left Elbow Extension 4/5     Treatment Re measured standing AROM for bilateral shoulder flexion/abduction/extension to track progress, pt demonstrated little to no change in overall AROM but was able to perform AROM BUE shoulder flexion without assistance in supine which has improved since previous progress check  Reassessed BUE strength for progress in seated position, pt demonstrated no change in BUE strength compared to previous progress check with grossly 3-/5 bilaterally  PROM to BUEs shoulder flexion/abduction/IR/ER to assess for shoulder mobility and tightness, pt demonstrated less muscle tightness than previous check and pt self reported decreased shoulder tightness than previously (Manual 8 mins)  Chest press with overhead shoulder flexion with #2 wand, 2 sets x 10 reps, min VCs to keep elbows extended throughout  Lat pull down on hoist machine, level 3, 2 sets x 10 reps, min A to bring bar down to chest level, min VCs to increase upright posture Mid row on hoist machine, level 3, 2 sets x 10 reps, min VCs to increase scapular retraction proper breathing technique  Wide row on hoist machine, level 3, 2 sets x 10 reps, min VCs for  hand placement and upright posture  Tricep pull down on cable machine, 2 sets x 10 reps, #17, min VCs for positioning and eccentric control                         PT Education - 09/13/16 1546    Education provided Yes   Education Details plan of care    Person(s) Educated Patient   Methods Explanation;Demonstration;Verbal cues   Comprehension Verbalized understanding;Returned demonstration;Verbal cues required             PT Long Term Goals - 09/13/16 1712      PT LONG TERM GOAL #1   Title Pt will be independent in HEP to improve his overall strength for more functional mobility.      Time 4   Period Weeks   Status On-going     PT LONG TERM GOAL #2   Title Pt will increase BUE shoulder strength to 3/5 to allow pt to reach across midline to bedside table, into fridge etc to promote greater functional mobility.     Time 4   Period Weeks   Status Partially Met     PT LONG TERM GOAL #3   Title Pt will report worst shoulder/neck pain of 2/10 to allow for full ability to perform ADLs   Time 4   Period Weeks   Status Partially Met     PT LONG TERM GOAL #4   Title Pt will increase BUE shoulder flexion ROM >80 degrees, abduction >80 degrees for more functional mobility and ease with driving and dressing.     Time 4   Period Weeks   Status Partially Met     PT LONG TERM GOAL #5   Title Pt will self report decreased quick DASH score of  >8 points to demonstrate self reported lower disability.     Time 4   Period Weeks   Status Partially Met               Plan - 09/13/16 1707    Clinical Impression Statement Pt continues to report 5/10 consistent thoracic tightness and numbness/tingling BUEs.  Pt demonstrated little to no change in AROM to BUEs in standing compared to previous session.  Pt also demonstarted little to no strength change since previous sessions in sitting.  Pt was able to perform shoulder flexion in supine independently without assistance which has progressed since previous attempts.  Pt does demonstrate greater ability to perform scapular strengthening exercises with less verbal cues.  Pt also demonstrated less shoulder tightness with manual stretching than previously.  He would continue to benefit from further skilled PT to work towards finalizing an HEP for greater functional independence.     Rehab Potential Good   Clinical Impairments Affecting Rehab Potential Positive: young, motivated     Negative: chronicity, n/t, difficulty diagnosing, proximal weakness     Clinical impression: evolving due to chronicity and difficulty diagnosing    PT  Frequency 1x / week   PT Duration 4 weeks   PT Treatment/Interventions Cryotherapy;Electrical Stimulation;Ultrasound;Traction;Moist Heat;Functional mobility training;Therapeutic activities;Therapeutic exercise;Balance training;Passive range of motion;Patient/family education;Manual techniques;Neuromuscular re-education;Energy conservation   PT Next Visit Plan 2 more vists left    PT Home Exercise Plan added thoracic extensions and seated extensions over chair, prone press ups       Patient will benefit from skilled therapeutic intervention in order to improve the following deficits and impairments:  Decreased activity tolerance, Decreased range of  motion, Hypomobility, Decreased strength, Decreased mobility, Pain, Postural dysfunction, Impaired UE functional use, Impaired flexibility  Visit Diagnosis: Muscle weakness (generalized) - Plan: PT plan of care cert/re-cert  Neck stiffness - Plan: PT plan of care cert/re-cert  Other lack of coordination - Plan: PT plan of care cert/re-cert     Problem List Patient Active Problem List   Diagnosis Date Noted  . ALS (amyotrophic lateral sclerosis) ([place]) 04/23/2016  . Cervical spinal stenosis 04/23/2016  . History of colon polyps 01/04/2016  . Diabetes mellitus without complication ([place]) 22/63/3354  . Hypertension 10/06/2015  . Personal history of colonic polyps   . Personal history of methicillin resistant Staphylococcus aureus 03/31/2013  . History of methicillin resistant Staphylococcus aureus infection 03/31/2013  . HLD (hyperlipidemia) 01/02/2007  . Allergic rhinitis 02/01/2006  . 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 Acid reflux 06/08/2002  . Gastro-esophageal reflux disease without esophagitis 06/08/2002   Stacy Gardner, SPT  This entire session was performed under direct supervision and direction of a licensed therapist/therapist assistant . I have personally read, edited and approve of the note as  written.  Trotter,Margaret PT, DPT 09/13/2016, 5:19 PM  [place] MAIN [facility] SERVICES [address] [place], [place], [address] Phone: [phone]   Fax:  [phone]  Name: Connor [name] MRN: [id number] Date of Birth: [birth year]

## 2016-09-19 ENCOUNTER — Ambulatory Visit: Payer: BLUE CROSS/BLUE SHIELD | Admitting: Physical Therapy

## 2016-09-20 ENCOUNTER — Other Ambulatory Visit: Payer: Self-pay | Admitting: Family Medicine

## 2016-09-20 ENCOUNTER — Telehealth: Payer: Self-pay | Admitting: Family Medicine

## 2016-09-20 DIAGNOSIS — E119 Type 2 diabetes mellitus without complications: Secondary | ICD-10-CM

## 2016-09-20 DIAGNOSIS — I10 Essential (primary) hypertension: Secondary | ICD-10-CM

## 2016-09-20 DIAGNOSIS — M4802 Spinal stenosis, cervical region: Secondary | ICD-10-CM

## 2016-09-20 MED ORDER — METFORMIN HCL 1000 MG PO TABS: 1000.0000 mg | ORAL_TABLET | Freq: Two times a day (BID) | ORAL | 3 refills | Status: DC

## 2016-09-20 MED ORDER — MELOXICAM 15 MG PO TABS: 15.0000 mg | ORAL_TABLET | Freq: Every day | ORAL | 1 refills | Status: DC

## 2016-09-20 MED ORDER — LISINOPRIL 20 MG PO TABS: 20.0000 mg | ORAL_TABLET | Freq: Every day | ORAL | 3 refills | Status: DC

## 2016-09-20 NOTE — Telephone Encounter (Signed)
Medication refilled. Has printed rx for sildenafil.

## 2016-09-20 NOTE — Telephone Encounter (Signed)
Pt contacted office for refill request on the following medications: [facility] mail order.  Fax#[phone]. Phone #[phone]/MW  metFORMIN (GLUCOPHAGE) 1000 MG tablet  glipiZIDE (GLIPIZIDE XL) 10 MG 24 hr tablet  lisinopril (PRINIVIL,ZESTRIL) 20 MG tablet  meloxicam (MOBIC) 15 MG tablet  sildenafil (REVATIO) 20 MG tablet

## 2016-09-20 NOTE — Telephone Encounter (Signed)
Please review. KW 

## 2016-09-25 ENCOUNTER — Encounter: Payer: Self-pay | Admitting: Physical Therapy

## 2016-09-25 NOTE — Therapy (Signed)
[place] MAIN [facility] SERVICES [address] [place], [place], [address] Phone: [phone]   Fax:  [phone]  September 25, 2016   '[email or web address] @  Physical Therapy Discharge Summary  Patient: Connor [name]  MRN: [id number]  Date of Birth: 10-22-1961   Diagnosis: No diagnosis found. Referring Provider: Dr. Manuella Ghazi  The above patient had been seen in Physical Therapy 11 times of 17 treatments scheduled. His last appointment was 09/13/16. Patient cancelled his remaining appointments due to not feeling like therapy was helping. The treatment consisted of strengthening, range of motion exercise, postural strengthening, manual therapy;  The patient is: slightly improved, his ROM improved however he continues to have difficulty raising UE overhead.   Functional Status at Discharge: Pt failed to come to last session for outcome measures.  Goals Partially Met      PT Long Term Goals - 09/13/16 1712      PT LONG TERM GOAL #1   Title Pt will be independent in HEP to improve his overall strength for more functional mobility.     Time 4   Period Weeks   Status On-going     PT LONG TERM GOAL #2   Title Pt will increase BUE shoulder strength to 3/5 to allow pt to reach across midline to bedside table, into fridge etc to promote greater functional mobility.     Time 4   Period Weeks   Status Partially Met     PT LONG TERM GOAL #3   Title Pt will report worst shoulder/neck pain of 2/10 to allow for full ability to perform ADLs   Time 4   Period Weeks   Status Partially Met     PT LONG TERM GOAL #4   Title Pt will increase BUE shoulder flexion ROM >80 degrees, abduction >80 degrees for more functional mobility and ease with driving and dressing.     Time 4   Period Weeks   Status Partially Met     PT LONG TERM GOAL #5   Title Pt will self report decreased quick DASH score of  >8 points to demonstrate self reported lower disability.     Time 4    Period Weeks   Status Partially Met        Sincerely,   Clydie Dillen, PT , DPT  CC [email or web address]' @  [place] [address] [place], [place], [address] Phone: [phone]   Fax:  [phone]  Patient: [name]  MRN: [id number]  Date of Birth: [birth year]

## 2016-09-26 ENCOUNTER — Ambulatory Visit: Payer: BLUE CROSS/BLUE SHIELD | Admitting: Physical Therapy

## 2016-10-04 ENCOUNTER — Ambulatory Visit: Payer: BLUE CROSS/BLUE SHIELD | Admitting: Physical Therapy

## 2016-10-08 ENCOUNTER — Ambulatory Visit: Payer: BLUE CROSS/BLUE SHIELD | Admitting: Physical Therapy

## 2016-10-15 ENCOUNTER — Encounter: Payer: Self-pay | Admitting: Family Medicine

## 2016-10-15 ENCOUNTER — Ambulatory Visit (INDEPENDENT_AMBULATORY_CARE_PROVIDER_SITE_OTHER): Payer: Medicare Other | Admitting: Family Medicine

## 2016-10-15 VITALS — BP 122/84 | HR 84 | Temp 98.4°F | Resp 16 | Wt 212.0 lb

## 2016-10-15 DIAGNOSIS — M4802 Spinal stenosis, cervical region: Secondary | ICD-10-CM | POA: Diagnosis not present

## 2016-10-15 DIAGNOSIS — R209 Unspecified disturbances of skin sensation: Secondary | ICD-10-CM | POA: Diagnosis not present

## 2016-10-15 DIAGNOSIS — IMO0001 Reserved for inherently not codable concepts without codable children: Secondary | ICD-10-CM

## 2016-10-15 NOTE — Progress Notes (Signed)
Subjective:     Patient ID: Connor [name], male   DOB: 07/09/1961, 55 y.o.   MRN: [id number]  HPI  Chief Complaint  Patient presents with  . Shoulder Pain    Right shoulder. Chronic paresthesias. Pt has noticed that the shoulder "looks dislocated" about 2 months ago. No worsening pain.  He has received physical therapy for bilateral shoulder weakness and possible ALS per neurology, Dr. Sherryll BurgerShah. He also has significant cervical spinal stenosis per MRI in December of last year. Reports intermittent neck pain and continued shoulder weakness with bilateral upper extremity paresthesias (R > L). Remains on meloxicam. Has follow up with neurology in February. Wishes further evaluation of neck pain.   Review of Systems     Objective:   Physical Exam  Constitutional: He appears well-developed and well-nourished. No distress.  Musculoskeletal:  Cervical FROM, Bilateral shoulders with diminished strength esp. Abduction and external rotation.       Assessment:    1. Cervical spinal stenosis - Ambulatory referral to Neurosurgery  2. Paresthesias/numbness - Ambulatory referral to Neurosurgery     Plan:    Further f/u pending neurosurgery evaluation.

## 2016-10-15 NOTE — Patient Instructions (Signed)
We will call you with the neurosurgery appointment.

## 2016-10-17 ENCOUNTER — Ambulatory Visit: Payer: BLUE CROSS/BLUE SHIELD | Admitting: Physical Therapy

## 2016-10-24 ENCOUNTER — Ambulatory Visit: Payer: BLUE CROSS/BLUE SHIELD | Admitting: Physical Therapy

## 2016-11-23 DIAGNOSIS — Z6830 Body mass index (BMI) 30.0-30.9, adult: Secondary | ICD-10-CM | POA: Diagnosis not present

## 2016-11-23 DIAGNOSIS — R253 Fasciculation: Secondary | ICD-10-CM | POA: Diagnosis not present

## 2016-11-23 DIAGNOSIS — M4712 Other spondylosis with myelopathy, cervical region: Secondary | ICD-10-CM | POA: Diagnosis not present

## 2016-11-23 DIAGNOSIS — M542 Cervicalgia: Secondary | ICD-10-CM | POA: Diagnosis not present

## 2016-11-23 DIAGNOSIS — I10 Essential (primary) hypertension: Secondary | ICD-10-CM | POA: Diagnosis not present

## 2016-11-23 DIAGNOSIS — M5412 Radiculopathy, cervical region: Secondary | ICD-10-CM | POA: Diagnosis not present

## 2016-11-27 DIAGNOSIS — M5412 Radiculopathy, cervical region: Secondary | ICD-10-CM | POA: Diagnosis not present

## 2016-11-27 DIAGNOSIS — M9981 Other biomechanical lesions of cervical region: Secondary | ICD-10-CM | POA: Diagnosis not present

## 2016-11-27 DIAGNOSIS — G5603 Carpal tunnel syndrome, bilateral upper limbs: Secondary | ICD-10-CM | POA: Diagnosis not present

## 2016-11-29 DIAGNOSIS — M4712 Other spondylosis with myelopathy, cervical region: Secondary | ICD-10-CM | POA: Diagnosis not present

## 2016-11-29 DIAGNOSIS — M4802 Spinal stenosis, cervical region: Secondary | ICD-10-CM | POA: Diagnosis not present

## 2016-11-30 DIAGNOSIS — M4712 Other spondylosis with myelopathy, cervical region: Secondary | ICD-10-CM | POA: Diagnosis not present

## 2016-12-03 ENCOUNTER — Other Ambulatory Visit: Payer: Self-pay | Admitting: Neurosurgery

## 2016-12-12 NOTE — Pre-Procedure Instructions (Signed)
Warden FillersRicky C Jaime  12/12/2016      [facility] - [place], [place] - [address] [place] [id number] Phone: [phone] Fax: [phone]  [facility] [address] - [place] - [address] [place] [address] Phone: [phone] Fax: [phone]  [facility] [place], [place] - [id number] AT [facility] [address] [place] [id number] Phone: [phone] Fax: [phone]    Your procedure is scheduled on Thurs, Feb 1 @ 10:50 AM  [facility] Admitting at 8:50 AM  Call this number if you have problems the morning of surgery:  [phone]   Remember:  Do not eat food or drink liquids after midnight.  Take these medicines the morning of surgery with A SIP OF WATER Omeprazole(Prilosec)              Stop taking your Mobic along with any Vitamins or Herbal Medications. No Goody's,BC's,Aleve,Advil,Motrin,Ibuprofen,or Fish Oil.      How to Manage Your Diabetes Before and After Surgery  Why is it important to control my blood sugar before and after surgery? . Improving blood sugar levels before and after surgery helps healing and can limit problems. . A way of improving blood sugar control is eating a healthy diet by: o  Eating less sugar and carbohydrates o  Increasing activity/exercise o  Talking with your doctor about reaching your blood sugar goals . High blood sugars (greater than 180 mg/dL) can raise your risk of infections and slow your recovery, so you will need to focus on controlling your diabetes during the weeks before surgery. . Make sure that the doctor who takes care of your diabetes knows about your planned surgery including the date and location.  How do I manage my blood sugar before surgery? . Check your blood sugar at least 4 times a day, starting 2 days before surgery, to make  sure that the level is not too high or low. o Check your blood sugar the morning of your surgery when you wake up and every 2 hours until you get to the Short Stay unit. . If your blood sugar is less than 70 mg/dL, you will need to treat for low blood sugar: o Do not take insulin. o Treat a low blood sugar (less than 70 mg/dL) with  cup of clear juice (cranberry or apple), 4 glucose tablets, OR glucose gel. o Recheck blood sugar in 15 minutes after treatment (to make sure it is greater than 70 mg/dL). If your blood sugar is not greater than 70 mg/dL on recheck, call [phone] for further instructions. . Report your blood sugar to the short stay nurse when you get to Short Stay.  . If you are admitted to the hospital after surgery: o Your blood sugar will be checked by the staff and you will probably be given insulin after surgery (instead of oral diabetes medicines) to make sure you have good blood sugar levels. o The goal for blood sugar control after surgery is 80-180 mg/dL.              WHAT DO I DO ABOUT MY DIABETES MEDICATION?      Reviewed and Endorsed by [facility] Patient Education Committee, August 2015   Do not wear jewelry.  Do not wear lotions, powders,colognes, or deoderant.  Men may shave face and neck.  Do  not bring valuables to the hospital.  [facility] is not responsible for any belongings or valuables.  Contacts, dentures or bridgework may not be worn into surgery.  Leave your suitcase in the car.  After surgery it may be brought to your room.  For patients admitted to the hospital, discharge time will be determined by your treatment team.  Patients discharged the day of surgery will not be allowed to drive home.    [facility] - Preparing for Surgery  Before surgery, you can play an important role.  Because skin is not sterile, your skin needs to be as free of germs as possible.  You can reduce the number of germs on you skin by  washing with CHG (chlorahexidine gluconate) soap before surgery.  CHG is an antiseptic cleaner which kills germs and bonds with the skin to continue killing germs even after washing.  Please DO NOT use if you have an allergy to CHG or antibacterial soaps.  If your skin becomes reddened/irritated stop using the CHG and inform your nurse when you arrive at Short Stay.  Do not shave (including legs and underarms) for at least 48 hours prior to the first CHG shower.  You may shave your face.  Please follow these instructions carefully:   1.  Shower with CHG Soap the night before surgery and the                                morning of Surgery.  2.  If you choose to wash your hair, wash your hair first as usual with your       normal shampoo.  3.  After you shampoo, rinse your hair and body thoroughly to remove the                      Shampoo.  4.  Use CHG as you would any other liquid soap.  You can apply chg directly       to the skin and wash gently with scrungie or a clean washcloth.  5.  Apply the CHG Soap to your body ONLY FROM THE NECK DOWN.        Do not use on open wounds or open sores.  Avoid contact with your eyes,       ears, mouth and genitals (private parts).  Wash genitals (private parts)       with your normal soap.  6.  Wash thoroughly, paying special attention to the area where your surgery        will be performed.  7.  Thoroughly rinse your body with warm water from the neck down.  8.  DO NOT shower/wash with your normal soap after using and rinsing off       the CHG Soap.  9.  Pat yourself dry with a clean towel.            10.  Wear clean pajamas.            11.  Place clean sheets on your bed the night of your first shower and do not        sleep with pets.  Day of Surgery  Do not apply any lotions/deoderants the morning of surgery.  Please wear clean clothes to the hospital/surgery center.    Please read over the following fact sheets that you were given. Pain Booklet,  Coughing and Deep Breathing,  MRSA Information and Surgical Site Infection Prevention

## 2016-12-13 ENCOUNTER — Encounter (HOSPITAL_COMMUNITY): Payer: Self-pay

## 2016-12-13 ENCOUNTER — Encounter (HOSPITAL_COMMUNITY)
Admission: RE | Admit: 2016-12-13 | Discharge: 2016-12-13 | Disposition: A | Discharge status: Home / Self Care | Payer: Medicare Other | Source: Ambulatory Visit | Attending: Neurosurgery | Admitting: Neurosurgery

## 2016-12-13 DIAGNOSIS — Z01810 Encounter for preprocedural cardiovascular examination: Secondary | ICD-10-CM | POA: Insufficient documentation

## 2016-12-13 DIAGNOSIS — Z01812 Encounter for preprocedural laboratory examination: Secondary | ICD-10-CM | POA: Insufficient documentation

## 2016-12-13 DIAGNOSIS — Z01818 Encounter for other preprocedural examination: Secondary | ICD-10-CM

## 2016-12-13 DIAGNOSIS — M4712 Other spondylosis with myelopathy, cervical region: Secondary | ICD-10-CM | POA: Insufficient documentation

## 2016-12-13 HISTORY — DX: Unspecified amblyopia, left eye: H53.002

## 2016-12-13 HISTORY — DX: Unspecified osteoarthritis, unspecified site: M19.90

## 2016-12-13 LAB — COMPREHENSIVE METABOLIC PANEL
ALT: 39 U/L (ref 17–63)
AST: 36 U/L (ref 15–41)
Albumin: 4.6 g/dL (ref 3.5–5.0)
Alkaline Phosphatase: 55 U/L (ref 38–126)
Anion gap: 9 (ref 5–15)
BUN: 18 mg/dL (ref 6–20)
CHLORIDE: 102 mmol/L (ref 101–111)
CO2: 27 mmol/L (ref 22–32)
CREATININE: 1.06 mg/dL (ref 0.61–1.24)
Calcium: 10 mg/dL (ref 8.9–10.3)
GFR calc Af Amer: >60 mL/min (ref >60)
Glucose, Bld: 238 mg/dL — ABNORMAL HIGH (ref 65–99)
Potassium: 4.7 mmol/L (ref 3.5–5.1)
Sodium: 138 mmol/L (ref 135–145)
Total Bilirubin: 0.5 mg/dL (ref 0.3–1.2)
Total Protein: 7.8 g/dL (ref 6.5–8.1)

## 2016-12-13 LAB — CBC
HCT: 44.2 % (ref 39.0–52.0)
Hemoglobin: 15.1 g/dL (ref 13.0–17.0)
MCH: 29.2 pg (ref 26.0–34.0)
MCHC: 34.2 g/dL (ref 30.0–36.0)
MCV: 85.5 fL (ref 78.0–100.0)
PLATELETS: 308 10*3/uL (ref 150–400)
RBC: 5.17 MIL/uL (ref 4.22–5.81)
RDW: 13.7 % (ref 11.5–15.5)
WBC: 7.9 10*3/uL (ref 4.0–10.5)

## 2016-12-13 LAB — SURGICAL PCR SCREEN
MRSA, PCR: NEGATIVE
STAPHYLOCOCCUS AUREUS: NEGATIVE

## 2016-12-13 LAB — TYPE AND SCREEN
ABO/RH(D): B POS
Antibody Screen: NEGATIVE

## 2016-12-13 LAB — ABO/RH: ABO/RH(D): B POS

## 2016-12-13 LAB — GLUCOSE, CAPILLARY: Glucose-Capillary: 262 mg/dL — ABNORMAL HIGH (ref 65–99)

## 2016-12-13 NOTE — Progress Notes (Signed)
Pt. Reports that he is followed by Dr. Theador Hawthorne. Fisher [facility], has [facility]. Pt. Denies ever having any cardiac surveillance, denies chest concerns. EKG done at some point at a "study/research" office where he was in a study for Diabetes. Pt. Doesn't remember the location.  Pt. Reports that "they kept my blood sugar in good control". Last HbgA1c- 9.0. Pt. Is awkward with the use of both arms, especially the L, yet when questioned about it he denied that there is a problem, other than a twitching in the R upper arm which is visible through his sweater. Chart will be reviewed  with anesth. Consult.

## 2016-12-14 LAB — HEMOGLOBIN A1C
Hgb A1c MFr Bld: 8.6 % — ABNORMAL HIGH (ref 4.8–5.6)
Mean Plasma Glucose: 200 mg/dL

## 2016-12-17 NOTE — Progress Notes (Signed)
Anesthesia Chart Review:  Pt is a 56 year old male scheduled for C3-4, C4-5, C5-6, C6-7, ACDF, interbody prosthesis, plate on 1/1/91472/11/2016 with Tressie StalkerJeffrey Jenkins, MD.   - Pt is seeing Cristopher PeruHemang Shah, MD with neurology for possible ALS (notes in care everywhere) - PCP is [place]ola [place]t [place], [place]  PMH includes:  HTN, DM, hyperlipdemia, GERD  Medications include: lipitor, glipizide, hctz, lisinopril, metformin, prilosec, cialis.   Preoperative labs reviewed.  HbA1c 8.6, glucose 238. I notified Nikki in Dr. Lovell SheehanJenkins' office that pt's DM not well controlled.   EKG 12/13/16: NSR. LAD.   If glucose acceptable DOS, I anticipate pt can proceed as scheduled.   Rica Mastngela Lattie Riege, FNP-BC [facility] Phone: [phone] 12/17/2016 3:20 PM

## 2017-01-04 ENCOUNTER — Other Ambulatory Visit: Payer: Self-pay | Admitting: Family Medicine

## 2017-01-04 DIAGNOSIS — I10 Essential (primary) hypertension: Secondary | ICD-10-CM

## 2017-01-04 DIAGNOSIS — E119 Type 2 diabetes mellitus without complications: Secondary | ICD-10-CM

## 2017-01-04 DIAGNOSIS — M4802 Spinal stenosis, cervical region: Secondary | ICD-10-CM

## 2017-01-04 MED ORDER — OMEPRAZOLE 40 MG PO CPDR: 40.0000 mg | DELAYED_RELEASE_CAPSULE | Freq: Every day | ORAL | 3 refills | Status: DC

## 2017-01-04 MED ORDER — CYCLOBENZAPRINE HCL 10 MG PO TABS: 10.0000 mg | ORAL_TABLET | Freq: Three times a day (TID) | ORAL | 3 refills | Status: DC | PRN

## 2017-01-04 MED ORDER — LISINOPRIL 20 MG PO TABS: 20.0000 mg | ORAL_TABLET | Freq: Every day | ORAL | 3 refills | Status: DC

## 2017-01-04 MED ORDER — HYDROCHLOROTHIAZIDE 25 MG PO TABS: 25.0000 mg | ORAL_TABLET | Freq: Every day | ORAL | 3 refills | Status: DC

## 2017-01-04 MED ORDER — GLIPIZIDE ER 10 MG PO TB24: 20.0000 mg | ORAL_TABLET | Freq: Every day | ORAL | 1 refills | Status: DC

## 2017-01-04 MED ORDER — METFORMIN HCL 1000 MG PO TABS: 1000.0000 mg | ORAL_TABLET | Freq: Two times a day (BID) | ORAL | 3 refills | Status: DC

## 2017-01-04 MED ORDER — MELOXICAM 15 MG PO TABS: 15.0000 mg | ORAL_TABLET | Freq: Every day | ORAL | 1 refills | Status: DC

## 2017-01-11 ENCOUNTER — Other Ambulatory Visit: Payer: Self-pay | Admitting: Neurosurgery

## 2017-01-11 ENCOUNTER — Encounter (HOSPITAL_COMMUNITY): Payer: Self-pay

## 2017-01-11 ENCOUNTER — Encounter (HOSPITAL_COMMUNITY)
Admission: RE | Admit: 2017-01-11 | Discharge: 2017-01-11 | Disposition: A | Discharge status: Home / Self Care | Payer: Medicare HMO | Source: Ambulatory Visit | Attending: Surgery | Admitting: Surgery

## 2017-01-11 DIAGNOSIS — E785 Hyperlipidemia, unspecified: Secondary | ICD-10-CM | POA: Diagnosis not present

## 2017-01-11 DIAGNOSIS — Z79899 Other long term (current) drug therapy: Secondary | ICD-10-CM | POA: Insufficient documentation

## 2017-01-11 DIAGNOSIS — E119 Type 2 diabetes mellitus without complications: Secondary | ICD-10-CM | POA: Insufficient documentation

## 2017-01-11 DIAGNOSIS — K219 Gastro-esophageal reflux disease without esophagitis: Secondary | ICD-10-CM | POA: Insufficient documentation

## 2017-01-11 DIAGNOSIS — I10 Essential (primary) hypertension: Secondary | ICD-10-CM | POA: Diagnosis not present

## 2017-01-11 DIAGNOSIS — Z01812 Encounter for preprocedural laboratory examination: Secondary | ICD-10-CM | POA: Diagnosis not present

## 2017-01-11 LAB — BASIC METABOLIC PANEL
ANION GAP: 12 (ref 5–15)
BUN: 13 mg/dL (ref 6–20)
CHLORIDE: 104 mmol/L (ref 101–111)
CO2: 20 mmol/L — AB (ref 22–32)
Calcium: 10 mg/dL (ref 8.9–10.3)
Creatinine, Ser: 0.87 mg/dL (ref 0.61–1.24)
GFR calc non Af Amer: >60 mL/min (ref >60)
Glucose, Bld: 112 mg/dL — ABNORMAL HIGH (ref 65–99)
Potassium: 4.2 mmol/L (ref 3.5–5.1)
Sodium: 136 mmol/L (ref 135–145)

## 2017-01-11 LAB — SURGICAL PCR SCREEN
MRSA, PCR: NEGATIVE
Staphylococcus aureus: NEGATIVE

## 2017-01-11 LAB — CBC
HCT: 42.1 % (ref 39.0–52.0)
HEMOGLOBIN: 14.4 g/dL (ref 13.0–17.0)
MCH: 29.4 pg (ref 26.0–34.0)
MCHC: 34.2 g/dL (ref 30.0–36.0)
MCV: 85.9 fL (ref 78.0–100.0)
Platelets: 294 10*3/uL (ref 150–400)
RBC: 4.9 MIL/uL (ref 4.22–5.81)
RDW: 13.6 % (ref 11.5–15.5)
WBC: 9.5 10*3/uL (ref 4.0–10.5)

## 2017-01-11 LAB — GLUCOSE, CAPILLARY: GLUCOSE-CAPILLARY: 127 mg/dL — AB (ref 65–99)

## 2017-01-11 NOTE — Pre-Procedure Instructions (Signed)
Connor [name]  01/11/2017      [facility] - [place], [place] - [address] [place] [address] Phone: [phone] Fax: [phone]  [facility] [address] [place] - [address] [place]) [place] [address] Phone: [phone] Fax: [phone]  [facility] - [place], [place] - [address] AT Portal to Reg[facility] [address] [place] [address] Phone: [phone] Fax: [phone]  [facility] - [place], [place] - [address] [place] [address] Phone: [phone] Fax: [phone]    Your procedure is scheduled on   Monday 01/21/17  Report to [facility] Admitting at 900 A.M.  Call this number if you have problems the morning of surgery:  [phone]   Remember:  Do not eat food or drink liquids after midnight.  Take these medicines the morning of surgery with A SIP OF WATER   OMEPRAZOLE (PRILOSEC)      (STOP 7 DAYS PRIOR TO SURGERY- ASPIRIN OR ASPIRIN PRODUCTS, IBUPROFEN/ ADVIL/ MOTRIN, MELOXICAM/ MOBIC, NAPROXEN/ ANAPROX, MULTIVITAMIN, HERBAL MEDICINES/ SUPPLEMENTS)    How to Manage Your Diabetes Before and After Surgery  Why is it important to control my blood sugar before and after surgery? . Improving blood sugar levels before and after surgery helps healing and can limit problems. . A way of improving blood sugar control is eating a healthy diet by: o  Eating less sugar and carbohydrates o  Increasing activity/exercise o  Talking with your doctor about reaching your blood sugar goals . High blood sugars (greater than 180 mg/dL) can raise your risk of infections and slow your recovery, so you will need to focus on controlling your diabetes during the weeks before surgery. . Make sure that the doctor who takes care of your diabetes knows about your planned surgery  including the date and location.  How do I manage my blood sugar before surgery? . Check your blood sugar at least 4 times a day, starting 2 days before surgery, to make sure that the level is not too high or low. o Check your blood sugar the morning of your surgery when you wake up and every 2 hours until you get to the Short Stay unit. . If your blood sugar is less than 70 mg/dL, you will need to treat for low blood sugar: o Do not take insulin. o Treat a low blood sugar (less than 70 mg/dL) with  cup of clear juice (cranberry or apple), 4 glucose tablets, OR glucose gel. o Recheck blood sugar in 15 minutes after treatment (to make sure it is greater than 70 mg/dL). If your blood sugar is not greater than 70 mg/dL on recheck, call [phone] for further instructions. . Report your blood sugar to the short stay nurse when you get to Short Stay.  . If you are admitted to the hospital after surgery: o Your blood sugar will be checked by the staff and you will probably be given insulin after surgery (instead of oral diabetes medicines) to make sure you have good blood sugar levels. o The goal for blood sugar control after surgery is 80-180 mg/dL.              WHAT DO I DO ABOUT MY DIABETES MEDICATION?   Marland Kitchen Do not take oral diabetes medicines (pills) the morning of surgery.  . THE NIGHT  BEFORE SURGERY, take ___________ units of ___________insulin.       Marland Kitchen. HE MORNING OF SURGERY, take _____________ units of __________insulin.  . The day of surgery, do not take other diabetes injectables, including Byetta (exenatide), Bydureon (exenatide ER), Victoza (liraglutide), or Trulicity (dulaglutide).  . If your CBG is greater than 220 mg/dL, you may take  of your sliding scale (correction) dose of insulin.  Other Instructions:          Patient Signature:  Date:   Nurse Signature:  Date:   Reviewed and Endorsed by [facility] Patient Education Committee, August 2015  Do not  wear jewelry, make-up or nail polish.  Do not wear lotions, powders, or perfumes, or deoderant.  Do not shave 48 hours prior to surgery.  Men may shave face and neck.  Do not bring valuables to the hospital.  [facility] is not responsible for any belongings or valuables.  Contacts, dentures or bridgework may not be worn into surgery.  Leave your suitcase in the car.  After surgery it may be brought to your room.  For patients admitted to the hospital, discharge time will be determined by your treatment team.  Patients discharged the day of surgery will not be allowed to drive home.   Name and phone number of your driver:    Special instructions:  [facility] - Preparing for Surgery  Before surgery, you can play an important role.  Because skin is not sterile, your skin needs to be as free of germs as possible.  You can reduce the number of germs on you skin by washing with CHG (chlorahexidine gluconate) soap before surgery.  CHG is an antiseptic cleaner which kills germs and bonds with the skin to continue killing germs even after washing.  Please DO NOT use if you have an allergy to CHG or antibacterial soaps.  If your skin becomes reddened/irritated stop using the CHG and inform your nurse when you arrive at Short Stay.  Do not shave (including legs and underarms) for at least 48 hours prior to the first CHG shower.  You may shave your face.  Please follow these instructions carefully:   1.  Shower with CHG Soap the night before surgery and the                                morning of Surgery.  2.  If you choose to wash your hair, wash your hair first as usual with your       normal shampoo.  3.  After you shampoo, rinse your hair and body thoroughly to remove the                      Shampoo.  4.  Use CHG as you would any other liquid soap.  You can apply chg directly       to the skin and wash gently with scrungie or a clean washcloth.  5.  Apply the CHG Soap to your body ONLY FROM  THE NECK DOWN.        Do not use on open wounds or open sores.  Avoid contact with your eyes,       ears, mouth and genitals (private parts).  Wash genitals (private parts)       with your normal soap.  6.  Wash thoroughly, paying special attention to the area where your surgery  will be performed.  7.  Thoroughly rinse your body with warm water from the neck down.  8.  DO NOT shower/wash with your normal soap after using and rinsing off       the CHG Soap.  9.  Pat yourself dry with a clean towel.            10.  Wear clean pajamas.            11.  Place clean sheets on your bed the night of your first shower and do not        sleep with pets.  Day of Surgery  Do not apply any lotions/deoderants the morning of surgery.  Please wear clean clothes to the hospital/surgery center.    Please read over the following fact sheets that you were given. MRSA Information and Surgical Site Infection Prevention

## 2017-01-11 NOTE — Progress Notes (Signed)
LEFT MESSAGE FOR NIKKI TO PLACE ORDER FOR CONSENT.

## 2017-01-20 ENCOUNTER — Encounter (HOSPITAL_COMMUNITY): Payer: Self-pay | Admitting: Anesthesiology

## 2017-01-20 NOTE — Anesthesia Preprocedure Evaluation (Addendum)
Anesthesia Evaluation  Patient identified by MRN, date of birth, ID band Patient awake    Reviewed: Allergy & Precautions, NPO status , Patient's Chart, lab work & pertinent test results  Airway Mallampati: II  TM Distance: >3 FB Neck ROM: Full    Dental  (+) Dental Advisory Given, Partial Lower, Partial Upper   Pulmonary neg pulmonary ROS,    breath sounds clear to auscultation       Cardiovascular hypertension, Pt. on medications  Rhythm:Regular Rate:Normal     Neuro/Psych negative neurological ROS  negative psych ROS   GI/Hepatic Neg liver ROS, GERD  Medicated,  Endo/Other  diabetes, Type 2, Oral Hypoglycemic Agents  Renal/GU negative Renal ROS  negative genitourinary   Musculoskeletal  (+) Arthritis , Osteoarthritis,    Abdominal   Peds negative pediatric ROS (+)  Hematology negative hematology ROS (+)   Anesthesia Other Findings - HLD  Reproductive/Obstetrics negative OB ROS                            Lab Results  Component Value Date   WBC 9.5 01/11/2017   HGB 14.4 01/11/2017   HCT 42.1 01/11/2017   MCV 85.9 01/11/2017   PLT 294 01/11/2017   Lab Results  Component Value Date   CREATININE 0.87 01/11/2017   BUN 13 01/11/2017   NA 136 01/11/2017   K 4.2 01/11/2017   CL 104 01/11/2017   CO2 20 (L) 01/11/2017   No results found for: INR, PROTIME  EKG: NSR  Anesthesia Physical Anesthesia Plan  ASA: II  Anesthesia Plan: General   Post-op Pain Management:    Induction: Intravenous  Airway Management Planned: Oral ETT and Video Laryngoscope Planned  Additional Equipment:   Intra-op Plan:   Post-operative Plan: Extubation in OR  Informed Consent: I have reviewed the patients History and Physical, chart, labs and discussed the procedure including the risks, benefits and alternatives for the proposed anesthesia with the patient or authorized representative who has  indicated his/her understanding and acceptance.   Dental advisory given  Plan Discussed with: CRNA  Anesthesia Plan Comments:         Anesthesia Quick Evaluation

## 2017-01-21 ENCOUNTER — Encounter (HOSPITAL_COMMUNITY): Payer: Self-pay | Admitting: Certified Registered Nurse Anesthetist

## 2017-01-21 ENCOUNTER — Inpatient Hospital Stay (HOSPITAL_COMMUNITY): Payer: Medicare HMO

## 2017-01-21 ENCOUNTER — Encounter (HOSPITAL_COMMUNITY)
Admission: RE | Disposition: A | Discharge status: Home / Self Care | Payer: Self-pay | Source: Ambulatory Visit | Attending: Neurosurgery

## 2017-01-21 ENCOUNTER — Inpatient Hospital Stay (HOSPITAL_COMMUNITY): Payer: Medicare HMO | Admitting: Emergency Medicine

## 2017-01-21 ENCOUNTER — Inpatient Hospital Stay (HOSPITAL_COMMUNITY)
Admission: RE | Admit: 2017-01-21 | Discharge: 2017-01-22 | DRG: 472 | Disposition: A | Discharge status: Home / Self Care | Payer: Medicare HMO | Source: Ambulatory Visit | Attending: Neurosurgery | Admitting: Neurosurgery

## 2017-01-21 ENCOUNTER — Inpatient Hospital Stay (HOSPITAL_COMMUNITY): Payer: Medicare HMO | Admitting: Anesthesiology

## 2017-01-21 DIAGNOSIS — Z79899 Other long term (current) drug therapy: Secondary | ICD-10-CM

## 2017-01-21 DIAGNOSIS — E785 Hyperlipidemia, unspecified: Secondary | ICD-10-CM | POA: Diagnosis not present

## 2017-01-21 DIAGNOSIS — M4802 Spinal stenosis, cervical region: Principal | ICD-10-CM | POA: Diagnosis present

## 2017-01-21 DIAGNOSIS — M4712 Other spondylosis with myelopathy, cervical region: Secondary | ICD-10-CM | POA: Diagnosis present

## 2017-01-21 DIAGNOSIS — Z7984 Long term (current) use of oral hypoglycemic drugs: Secondary | ICD-10-CM | POA: Diagnosis not present

## 2017-01-21 DIAGNOSIS — R292 Abnormal reflex: Secondary | ICD-10-CM | POA: Diagnosis present

## 2017-01-21 DIAGNOSIS — E119 Type 2 diabetes mellitus without complications: Secondary | ICD-10-CM | POA: Diagnosis present

## 2017-01-21 DIAGNOSIS — I10 Essential (primary) hypertension: Secondary | ICD-10-CM | POA: Diagnosis not present

## 2017-01-21 DIAGNOSIS — R200 Anesthesia of skin: Secondary | ICD-10-CM | POA: Diagnosis present

## 2017-01-21 DIAGNOSIS — Z791 Long term (current) use of non-steroidal anti-inflammatories (NSAID): Secondary | ICD-10-CM | POA: Diagnosis not present

## 2017-01-21 DIAGNOSIS — Z419 Encounter for procedure for purposes other than remedying health state, unspecified: Secondary | ICD-10-CM

## 2017-01-21 DIAGNOSIS — K219 Gastro-esophageal reflux disease without esophagitis: Secondary | ICD-10-CM | POA: Diagnosis present

## 2017-01-21 DIAGNOSIS — M5001 Cervical disc disorder with myelopathy,  high cervical region: Secondary | ICD-10-CM | POA: Diagnosis not present

## 2017-01-21 DIAGNOSIS — M4322 Fusion of spine, cervical region: Secondary | ICD-10-CM | POA: Diagnosis not present

## 2017-01-21 HISTORY — PX: ANTERIOR CERVICAL DECOMPRESSION/DISCECTOMY FUSION 4 LEVELS: SHX5556

## 2017-01-21 LAB — GLUCOSE, CAPILLARY
GLUCOSE-CAPILLARY: 140 mg/dL — AB (ref 65–99)
GLUCOSE-CAPILLARY: 259 mg/dL — AB (ref 65–99)
GLUCOSE-CAPILLARY: 230 mg/dL — AB (ref 65–99)
GLUCOSE-CAPILLARY: 161 mg/dL — AB (ref 65–99)
Glucose-Capillary: 208 mg/dL — ABNORMAL HIGH (ref 65–99)

## 2017-01-21 SURGERY — ANTERIOR CERVICAL DECOMPRESSION/DISCECTOMY FUSION 4 LEVELS
Anesthesia: General

## 2017-01-21 MED ORDER — ALUM & MAG HYDROXIDE-SIMETH 200-200-20 MG/5ML PO SUSP
30.0000 mL | Freq: Four times a day (QID) | ORAL | Status: DC | PRN
Start: 2017-01-21 — End: 2017-01-22

## 2017-01-21 MED ORDER — CHLORHEXIDINE GLUCONATE CLOTH 2 % EX PADS: 6.0000 | MEDICATED_PAD | Freq: Once | CUTANEOUS | Status: DC

## 2017-01-21 MED ORDER — THROMBIN 5000 UNITS EX SOLR
CUTANEOUS | Status: AC
  Filled 2017-01-21: qty 5000

## 2017-01-21 MED ORDER — ONDANSETRON HCL 4 MG/2ML IJ SOLN
INTRAMUSCULAR | Status: DC | PRN
  Administered 2017-01-21: 4 mg via INTRAVENOUS

## 2017-01-21 MED ORDER — SODIUM CHLORIDE 0.9 % IJ SOLN
INTRAMUSCULAR | Status: AC
  Filled 2017-01-21: qty 10

## 2017-01-21 MED ORDER — LISINOPRIL 20 MG PO TABS
20.0000 mg | ORAL_TABLET | Freq: Every day | ORAL | Status: DC
  Administered 2017-01-21 – 2017-01-22 (×2): 20 mg via ORAL
  Filled 2017-01-21 (×2): qty 1

## 2017-01-21 MED ORDER — PROMETHAZINE HCL 25 MG/ML IJ SOLN: 6.2500 mg | INTRAMUSCULAR | Status: DC | PRN

## 2017-01-21 MED ORDER — GLIPIZIDE ER 10 MG PO TB24
20.0000 mg | ORAL_TABLET | Freq: Every day | ORAL | Status: DC
  Administered 2017-01-21 – 2017-01-22 (×2): 20 mg via ORAL
  Filled 2017-01-21 (×2): qty 2

## 2017-01-21 MED ORDER — HYDROMORPHONE HCL 1 MG/ML IJ SOLN
0.2500 mg | INTRAMUSCULAR | Status: DC | PRN
  Administered 2017-01-21 (×2): 0.5 mg via INTRAVENOUS

## 2017-01-21 MED ORDER — LACTATED RINGERS IV SOLN: INTRAVENOUS | Status: DC

## 2017-01-21 MED ORDER — FENTANYL CITRATE (PF) 100 MCG/2ML IJ SOLN
INTRAMUSCULAR | Status: AC
  Filled 2017-01-21: qty 4

## 2017-01-21 MED ORDER — PHENYLEPHRINE 40 MCG/ML (10ML) SYRINGE FOR IV PUSH (FOR BLOOD PRESSURE SUPPORT)
PREFILLED_SYRINGE | INTRAVENOUS | Status: AC
  Filled 2017-01-21: qty 10

## 2017-01-21 MED ORDER — MEPERIDINE HCL 25 MG/ML IJ SOLN: 6.2500 mg | INTRAMUSCULAR | Status: DC | PRN

## 2017-01-21 MED ORDER — ONDANSETRON HCL 4 MG PO TABS: 4.0000 mg | ORAL_TABLET | Freq: Four times a day (QID) | ORAL | Status: DC | PRN

## 2017-01-21 MED ORDER — CEFAZOLIN SODIUM 1 G IJ SOLR
INTRAMUSCULAR | Status: DC | PRN
  Administered 2017-01-21: 2 g via INTRAMUSCULAR

## 2017-01-21 MED ORDER — BUPIVACAINE-EPINEPHRINE (PF) 0.5% -1:200000 IJ SOLN
INTRAMUSCULAR | Status: DC | PRN
  Administered 2017-01-21: 10 mL via PERINEURAL

## 2017-01-21 MED ORDER — ATORVASTATIN CALCIUM 20 MG PO TABS
40.0000 mg | ORAL_TABLET | Freq: Every day | ORAL | Status: DC
  Administered 2017-01-21 – 2017-01-22 (×2): 40 mg via ORAL
  Filled 2017-01-21 (×2): qty 2

## 2017-01-21 MED ORDER — ROCURONIUM BROMIDE 10 MG/ML (PF) SYRINGE
PREFILLED_SYRINGE | INTRAVENOUS | Status: DC | PRN
  Administered 2017-01-21 (×2): 50 mg via INTRAVENOUS

## 2017-01-21 MED ORDER — BACITRACIN ZINC 500 UNIT/GM EX OINT
TOPICAL_OINTMENT | CUTANEOUS | Status: AC
  Filled 2017-01-21: qty 28.35

## 2017-01-21 MED ORDER — VECURONIUM BROMIDE 10 MG IV SOLR
INTRAVENOUS | Status: AC
  Filled 2017-01-21: qty 10

## 2017-01-21 MED ORDER — ONDANSETRON HCL 4 MG/2ML IJ SOLN: 4.0000 mg | Freq: Four times a day (QID) | INTRAMUSCULAR | Status: DC | PRN

## 2017-01-21 MED ORDER — FENTANYL CITRATE (PF) 100 MCG/2ML IJ SOLN
INTRAMUSCULAR | Status: DC | PRN
  Administered 2017-01-21 (×2): 50 ug via INTRAVENOUS
  Administered 2017-01-21: 100 ug via INTRAVENOUS

## 2017-01-21 MED ORDER — PHENYLEPHRINE 40 MCG/ML (10ML) SYRINGE FOR IV PUSH (FOR BLOOD PRESSURE SUPPORT)
PREFILLED_SYRINGE | INTRAVENOUS | Status: DC | PRN
  Administered 2017-01-21 (×2): 80 ug via INTRAVENOUS

## 2017-01-21 MED ORDER — OXYCODONE HCL 5 MG PO TABS
ORAL_TABLET | ORAL | Status: AC
  Filled 2017-01-21: qty 2

## 2017-01-21 MED ORDER — PROPOFOL 10 MG/ML IV BOLUS
INTRAVENOUS | Status: AC
  Filled 2017-01-21: qty 40

## 2017-01-21 MED ORDER — CEFAZOLIN SODIUM-DEXTROSE 2-4 GM/100ML-% IV SOLN
2.0000 g | Freq: Three times a day (TID) | INTRAVENOUS | Status: AC
  Administered 2017-01-21 – 2017-01-22 (×2): 2 g via INTRAVENOUS
  Filled 2017-01-21 (×2): qty 100

## 2017-01-21 MED ORDER — BACITRACIN ZINC 500 UNIT/GM EX OINT
TOPICAL_OINTMENT | CUTANEOUS | Status: DC | PRN
  Administered 2017-01-21: 1 via TOPICAL

## 2017-01-21 MED ORDER — ACETAMINOPHEN 325 MG PO TABS: 650.0000 mg | ORAL_TABLET | ORAL | Status: DC | PRN

## 2017-01-21 MED ORDER — BISACODYL 10 MG RE SUPP: 10.0000 mg | Freq: Every day | RECTAL | Status: DC | PRN

## 2017-01-21 MED ORDER — ROCURONIUM BROMIDE 50 MG/5ML IV SOSY
PREFILLED_SYRINGE | INTRAVENOUS | Status: AC
  Filled 2017-01-21: qty 5

## 2017-01-21 MED ORDER — CYCLOBENZAPRINE HCL 10 MG PO TABS
10.0000 mg | ORAL_TABLET | Freq: Three times a day (TID) | ORAL | Status: DC | PRN
  Administered 2017-01-21 (×2): 10 mg via ORAL
  Filled 2017-01-21: qty 1

## 2017-01-21 MED ORDER — LIDOCAINE 2% (20 MG/ML) 5 ML SYRINGE
INTRAMUSCULAR | Status: DC | PRN
  Administered 2017-01-21: 60 mg via INTRAVENOUS

## 2017-01-21 MED ORDER — DEXAMETHASONE SODIUM PHOSPHATE 4 MG/ML IJ SOLN
4.0000 mg | Freq: Four times a day (QID) | INTRAMUSCULAR | Status: AC
  Administered 2017-01-21: 4 mg via INTRAVENOUS
  Filled 2017-01-21: qty 1

## 2017-01-21 MED ORDER — SODIUM CHLORIDE 0.9 % IR SOLN
Status: DC | PRN
  Administered 2017-01-21: 08:00:00

## 2017-01-21 MED ORDER — MORPHINE SULFATE (PF) 4 MG/ML IV SOLN: 4.0000 mg | INTRAVENOUS | Status: DC | PRN

## 2017-01-21 MED ORDER — DEXAMETHASONE 4 MG PO TABS
4.0000 mg | ORAL_TABLET | Freq: Four times a day (QID) | ORAL | Status: AC
  Administered 2017-01-21 – 2017-01-22 (×3): 4 mg via ORAL
  Filled 2017-01-21 (×3): qty 1

## 2017-01-21 MED ORDER — THROMBIN 20000 UNITS EX SOLR
CUTANEOUS | Status: AC
  Filled 2017-01-21: qty 20000

## 2017-01-21 MED ORDER — THROMBIN 20000 UNITS EX SOLR
CUTANEOUS | Status: DC | PRN
  Administered 2017-01-21: 07:00:00 via TOPICAL

## 2017-01-21 MED ORDER — ACETAMINOPHEN 650 MG RE SUPP: 650.0000 mg | RECTAL | Status: DC | PRN

## 2017-01-21 MED ORDER — ADULT MULTIVITAMIN W/MINERALS CH: 1.0000 | ORAL_TABLET | Freq: Every day | ORAL | Status: DC

## 2017-01-21 MED ORDER — ALBUMIN HUMAN 5 % IV SOLN
INTRAVENOUS | Status: DC | PRN
  Administered 2017-01-21: 11:00:00 via INTRAVENOUS

## 2017-01-21 MED ORDER — SUGAMMADEX SODIUM 200 MG/2ML IV SOLN
INTRAVENOUS | Status: DC | PRN
  Administered 2017-01-21: 190 mg via INTRAVENOUS

## 2017-01-21 MED ORDER — CEFAZOLIN SODIUM-DEXTROSE 2-4 GM/100ML-% IV SOLN
2.0000 g | INTRAVENOUS | Status: AC
  Administered 2017-01-21: 2 g via INTRAVENOUS
  Filled 2017-01-21: qty 100

## 2017-01-21 MED ORDER — CEFAZOLIN SODIUM 1 G IJ SOLR
INTRAMUSCULAR | Status: AC
  Filled 2017-01-21: qty 20

## 2017-01-21 MED ORDER — INSULIN ASPART 100 UNIT/ML ~~LOC~~ SOLN
0.0000 [IU] | Freq: Three times a day (TID) | SUBCUTANEOUS | Status: DC
  Administered 2017-01-21: 7 [IU] via SUBCUTANEOUS
  Administered 2017-01-22: 3 [IU] via SUBCUTANEOUS

## 2017-01-21 MED ORDER — PHENYLEPHRINE HCL 10 MG/ML IJ SOLN
INTRAMUSCULAR | Status: AC
  Filled 2017-01-21: qty 1

## 2017-01-21 MED ORDER — THROMBIN 5000 UNITS EX SOLR
OROMUCOSAL | Status: DC | PRN
  Administered 2017-01-21 (×2): via TOPICAL

## 2017-01-21 MED ORDER — BUPIVACAINE-EPINEPHRINE (PF) 0.5% -1:200000 IJ SOLN
INTRAMUSCULAR | Status: AC
  Filled 2017-01-21: qty 30

## 2017-01-21 MED ORDER — PANTOPRAZOLE SODIUM 40 MG PO TBEC
80.0000 mg | DELAYED_RELEASE_TABLET | Freq: Every day | ORAL | Status: DC
  Administered 2017-01-22: 80 mg via ORAL
  Filled 2017-01-21: qty 2

## 2017-01-21 MED ORDER — PHENOL 1.4 % MT LIQD
1.0000 | OROMUCOSAL | Status: DC | PRN
  Administered 2017-01-21: 1 via OROMUCOSAL
  Filled 2017-01-21: qty 177

## 2017-01-21 MED ORDER — HYDROMORPHONE HCL 1 MG/ML IJ SOLN
INTRAMUSCULAR | Status: AC
  Filled 2017-01-21: qty 1

## 2017-01-21 MED ORDER — PROPOFOL 10 MG/ML IV BOLUS
INTRAVENOUS | Status: DC | PRN
  Administered 2017-01-21: 170 mg via INTRAVENOUS

## 2017-01-21 MED ORDER — MIDAZOLAM HCL 5 MG/5ML IJ SOLN
INTRAMUSCULAR | Status: DC | PRN
  Administered 2017-01-21 (×2): 1 mg via INTRAVENOUS

## 2017-01-21 MED ORDER — ONDANSETRON HCL 4 MG/2ML IJ SOLN
INTRAMUSCULAR | Status: AC
  Filled 2017-01-21: qty 2

## 2017-01-21 MED ORDER — DEXAMETHASONE SODIUM PHOSPHATE 10 MG/ML IJ SOLN
INTRAMUSCULAR | Status: DC | PRN
  Administered 2017-01-21: 10 mg via INTRAVENOUS

## 2017-01-21 MED ORDER — DOCUSATE SODIUM 100 MG PO CAPS
100.0000 mg | ORAL_CAPSULE | Freq: Two times a day (BID) | ORAL | Status: DC
  Administered 2017-01-21 – 2017-01-22 (×3): 100 mg via ORAL
  Filled 2017-01-21 (×3): qty 1

## 2017-01-21 MED ORDER — MIDAZOLAM HCL 2 MG/2ML IJ SOLN
INTRAMUSCULAR | Status: AC
  Filled 2017-01-21: qty 2

## 2017-01-21 MED ORDER — HYDROCHLOROTHIAZIDE 25 MG PO TABS
25.0000 mg | ORAL_TABLET | Freq: Every day | ORAL | Status: DC
  Administered 2017-01-21 – 2017-01-22 (×2): 25 mg via ORAL
  Filled 2017-01-21 (×2): qty 1

## 2017-01-21 MED ORDER — INSULIN ASPART 100 UNIT/ML ~~LOC~~ SOLN: 0.0000 [IU] | SUBCUTANEOUS | Status: DC

## 2017-01-21 MED ORDER — LIDOCAINE 2% (20 MG/ML) 5 ML SYRINGE
INTRAMUSCULAR | Status: AC
  Filled 2017-01-21: qty 5

## 2017-01-21 MED ORDER — MENTHOL 3 MG MT LOZG: 1.0000 | LOZENGE | OROMUCOSAL | Status: DC | PRN

## 2017-01-21 MED ORDER — LACTATED RINGERS IV SOLN
INTRAVENOUS | Status: DC | PRN
  Administered 2017-01-21 (×2): via INTRAVENOUS

## 2017-01-21 MED ORDER — PHENYLEPHRINE HCL 10 MG/ML IJ SOLN
INTRAMUSCULAR | Status: DC | PRN
  Administered 2017-01-21: 25 ug/min via INTRAVENOUS
  Administered 2017-01-21: 11:00:00 via INTRAVENOUS
  Administered 2017-01-21: 50 ug/min via INTRAVENOUS

## 2017-01-21 MED ORDER — OXYCODONE HCL 5 MG PO TABS
5.0000 mg | ORAL_TABLET | ORAL | Status: DC | PRN
  Administered 2017-01-21 – 2017-01-22 (×5): 10 mg via ORAL
  Filled 2017-01-21 (×4): qty 2

## 2017-01-21 MED ORDER — VECURONIUM BROMIDE 10 MG IV SOLR
INTRAVENOUS | Status: DC | PRN
  Administered 2017-01-21 (×4): 4 mg via INTRAVENOUS

## 2017-01-21 MED ORDER — CYCLOBENZAPRINE HCL 10 MG PO TABS
ORAL_TABLET | ORAL | Status: AC
  Filled 2017-01-21: qty 1

## 2017-01-21 MED ORDER — METFORMIN HCL 500 MG PO TABS
1000.0000 mg | ORAL_TABLET | Freq: Two times a day (BID) | ORAL | Status: DC
  Administered 2017-01-21 – 2017-01-22 (×2): 1000 mg via ORAL
  Filled 2017-01-21 (×2): qty 2

## 2017-01-21 MED ORDER — 0.9 % SODIUM CHLORIDE (POUR BTL) OPTIME
TOPICAL | Status: DC | PRN
  Administered 2017-01-21: 1000 mL

## 2017-01-21 SURGICAL SUPPLY — 71 items
BAG DECANTER FOR FLEXI CONT (MISCELLANEOUS) ×3 IMPLANT
BENZOIN TINCTURE PRP APPL 2/3 (GAUZE/BANDAGES/DRESSINGS) ×3 IMPLANT
BIT DRILL NEURO 2X3.1 SFT TUCH (MISCELLANEOUS) ×1 IMPLANT
BLADE SURG 15 STRL LF DISP TIS (BLADE) ×2 IMPLANT
BLADE SURG 15 STRL SS (BLADE) ×4
BLADE ULTRA TIP 2M (BLADE) ×3 IMPLANT
BUR BARREL STRAIGHT FLUTE 4.0 (BURR) ×6 IMPLANT
BUR MATCHSTICK NEURO 3.0 LAGG (BURR) ×3 IMPLANT
CANISTER SUCT 3000ML PPV (MISCELLANEOUS) ×3 IMPLANT
CARTRIDGE OIL MAESTRO DRILL (MISCELLANEOUS) ×1 IMPLANT
CLOSURE WOUND 1/2 X4 (GAUZE/BANDAGES/DRESSINGS) ×1
COVER MAYO STAND STRL (DRAPES) ×3 IMPLANT
DEVICE FUSION VISTA 14X14X9MM (Spacer) ×1 IMPLANT
DIFFUSER DRILL AIR PNEUMATIC (MISCELLANEOUS) ×3 IMPLANT
DRAIN JACKSON PRATT 10MM FLAT (MISCELLANEOUS) ×3 IMPLANT
DRAPE LAPAROTOMY 100X72 PEDS (DRAPES) ×3 IMPLANT
DRAPE MICROSCOPE LEICA (MISCELLANEOUS) IMPLANT
DRAPE POUCH INSTRU U-SHP 10X18 (DRAPES) ×3 IMPLANT
DRAPE SURG 17X23 STRL (DRAPES) ×6 IMPLANT
DRILL NEURO 2X3.1 SOFT TOUCH (MISCELLANEOUS) ×3
ELECT BLADE 4.0 EZ CLEAN MEGAD (MISCELLANEOUS) ×3
ELECT REM PT RETURN 9FT ADLT (ELECTROSURGICAL) ×3
ELECTRODE BLDE 4.0 EZ CLN MEGD (MISCELLANEOUS) ×1 IMPLANT
ELECTRODE REM PT RTRN 9FT ADLT (ELECTROSURGICAL) ×1 IMPLANT
EVACUATOR SILICONE 100CC (DRAIN) ×3 IMPLANT
GAUZE SPONGE 4X4 12PLY STRL (GAUZE/BANDAGES/DRESSINGS) ×3 IMPLANT
GAUZE SPONGE 4X4 16PLY XRAY LF (GAUZE/BANDAGES/DRESSINGS) IMPLANT
GLOVE BIO SURGEON STRL SZ8 (GLOVE) ×3 IMPLANT
GLOVE BIO SURGEON STRL SZ8.5 (GLOVE) ×3 IMPLANT
GLOVE ECLIPSE 8.5 STRL (GLOVE) ×3 IMPLANT
GLOVE EXAM NITRILE LRG STRL (GLOVE) IMPLANT
GLOVE EXAM NITRILE XL STR (GLOVE) IMPLANT
GLOVE EXAM NITRILE XS STR PU (GLOVE) IMPLANT
GLOVE INDICATOR 7.0 STRL GRN (GLOVE) ×9 IMPLANT
GLOVE INDICATOR 8.5 STRL (GLOVE) ×3 IMPLANT
GLOVE SURG SS PI 6.5 STRL IVOR (GLOVE) ×3 IMPLANT
GOWN L4 XXLG W/PAP TWL (GOWN DISPOSABLE) ×3 IMPLANT
GOWN STRL REUS TWL 2XL XL LVL4 (GOWN DISPOSABLE) ×3 IMPLANT
GOWN STRL REUS W/ TWL LRG LVL3 (GOWN DISPOSABLE) ×1 IMPLANT
GOWN STRL REUS W/ TWL XL LVL3 (GOWN DISPOSABLE) ×1 IMPLANT
GOWN STRL REUS W/TWL 2XL LVL3 (GOWN DISPOSABLE) ×3 IMPLANT
GOWN STRL REUS W/TWL LRG LVL3 (GOWN DISPOSABLE) ×2
GOWN STRL REUS W/TWL XL LVL3 (GOWN DISPOSABLE) ×2
HEMOSTAT POWDER KIT SURGIFOAM (HEMOSTASIS) ×6 IMPLANT
KIT BASIN OR (CUSTOM PROCEDURE TRAY) ×3 IMPLANT
KIT ROOM TURNOVER OR (KITS) ×3 IMPLANT
MARKER SKIN DUAL TIP RULER LAB (MISCELLANEOUS) ×3 IMPLANT
NEEDLE HYPO 22GX1.5 SAFETY (NEEDLE) ×3 IMPLANT
NEEDLE SPNL 18GX3.5 QUINCKE PK (NEEDLE) ×3 IMPLANT
NS IRRIG 1000ML POUR BTL (IV SOLUTION) ×3 IMPLANT
OIL CARTRIDGE MAESTRO DRILL (MISCELLANEOUS) ×3
PACK LAMINECTOMY NEURO (CUSTOM PROCEDURE TRAY) ×3 IMPLANT
PATTIES SURGICAL 1X1 (DISPOSABLE) ×3 IMPLANT
PEEK VISTA 14X14X7MM (Peek) ×9 IMPLANT
PEEK VISTA 14X14X8MM (Peek) IMPLANT
PIN DISTRACTION 14MM (PIN) ×6 IMPLANT
PLATE ANT CERV XTEND 4 LV 69 (Plate) ×3 IMPLANT
PUTTY KINEX BIACTIVE P 10CC (Putty) ×3 IMPLANT
RUBBERBAND STERILE (MISCELLANEOUS) IMPLANT
SCREW XTD VAR 4.2 SELF TAP (Screw) ×30 IMPLANT
SPONGE INTESTINAL PEANUT (DISPOSABLE) ×6 IMPLANT
SPONGE SURGIFOAM ABS GEL 100 (HEMOSTASIS) ×3 IMPLANT
STRIP CLOSURE SKIN 1/2X4 (GAUZE/BANDAGES/DRESSINGS) ×2 IMPLANT
SUT VIC AB 0 CT1 27 (SUTURE) ×4
SUT VIC AB 0 CT1 27XBRD ANTBC (SUTURE) ×2 IMPLANT
SUT VIC AB 3-0 SH 8-18 (SUTURE) ×3 IMPLANT
TAPE CLOTH SURG 4X10 WHT LF (GAUZE/BANDAGES/DRESSINGS) ×3 IMPLANT
TOWEL GREEN STERILE (TOWEL DISPOSABLE) ×3 IMPLANT
TOWEL GREEN STERILE FF (TOWEL DISPOSABLE) ×2 IMPLANT
VISTA 14X14X9MM (Spacer) ×3 IMPLANT
WATER STERILE IRR 1000ML POUR (IV SOLUTION) ×3 IMPLANT

## 2017-01-21 NOTE — H&P (Signed)
Subjective: The patient is a 56 year old black male who has complained of neck pain, arm and hand numbness and weakness, spasticity, etc. He was worked up with a cervical MRI which demonstrated multilevel spondylosis and stenosis. I discussed the various treatment options. He has decided to proceed with surgery.   Past Medical History:  Diagnosis Date  . Arthritis    cervical spondylosis, myelopathy   . Diabetes mellitus without complication (HCC)    controlled  . GERD (gastroesophageal reflux disease)   . Hyperlipidemia   . Hypertension    controlled  . Lazy eye, left     Past Surgical History:  Procedure Laterality Date  . COLONOSCOPY WITH PROPOFOL N/A 08/30/2015   Procedure: COLONOSCOPY WITH PROPOFOL;  Surgeon: Midge Miniumarren Wohl, MD;  Location: [facility] ENDOSCOPY;  Service: Endoscopy;  Laterality: N/A;    Allergies  Allergen Reactions  . No Known Allergies     Social History  Substance Use Topics  . Smoking status: Never Smoker  . Smokeless tobacco: Never Used  . Alcohol use No    History reviewed. No pertinent family history. Prior to Admission medications   Medication Sig Start Date End Date Taking? Authorizing Provider  atorvastatin (LIPITOR) 40 MG tablet TAKE 1 TABLET DAILY Patient taking differently: Take 40mg s once daily 01/31/16  Yes Anola Gurneyobert Chauvin, PA  cyclobenzaprine (FLEXERIL) 10 MG tablet Take 1 tablet (10 mg total) by mouth 3 (three) times daily as needed for muscle spasms. 01/04/17  Yes Anola Gurneyobert Chauvin, PA  glipiZIDE (GLIPIZIDE XL) 10 MG 24 hr tablet Take 2 tablets (20 mg total) by mouth daily. 01/04/17  Yes Anola Gurneyobert Chauvin, PA  hydrochlorothiazide (HYDRODIURIL) 25 MG tablet Take 1 tablet (25 mg total) by mouth daily. 01/04/17  Yes Anola Gurneyobert Chauvin, PA  lisinopril (PRINIVIL,ZESTRIL) 20 MG tablet Take 1 tablet (20 mg total) by mouth daily. 01/04/17  Yes Anola Gurneyobert Chauvin, PA  meloxicam (MOBIC) 15 MG tablet Take 1 tablet (15 mg total) by mouth daily. with food 01/04/17  Yes Anola Gurneyobert  Chauvin, PA  metFORMIN (GLUCOPHAGE) 1000 MG tablet Take 1 tablet (1,000 mg total) by mouth 2 (two) times daily. With a meal 01/04/17  Yes Anola Gurneyobert Chauvin, PA  Multiple Vitamin (MULTIVITAMIN WITH MINERALS) TABS tablet Take 1 tablet by mouth daily.   Yes Historical Provider, MD  naproxen sodium (ANAPROX) 220 MG tablet Take 220 mg by mouth 2 (two) times daily with a meal.   Yes Historical Provider, MD  omeprazole (PRILOSEC) 40 MG capsule Take 1 capsule (40 mg total) by mouth daily. 01/04/17  Yes Anola Gurneyobert Chauvin, PA  OVER THE COUNTER MEDICATION Take 1 tablet by mouth daily. "ever strong"   Yes Historical Provider, MD  sildenafil (REVATIO) 20 MG tablet Take 3- 5 pills daily as needed for erectile dysfunction Patient not taking: Reported on 12/03/2016 09/06/16   Anola Gurneyobert Chauvin, PA  tadalafil (CIALIS) 20 MG tablet Take by mouth as needed. Reported on 04/23/2016 12/23/13   Historical Provider, MD     Review of Systems  Positive ROS: As above  All other systems have been reviewed and were otherwise negative with the exception of those mentioned in the HPI and as above.  Objective: Vital signs in last 24 hours: Temp:  [98.7 F (37.1 C)] 98.7 F (37.1 C) (03/05 0607) Pulse Rate:  [86] 86 (03/05 0607) Resp:  [20] 20 (03/05 0607) BP: (139-148)/(98-105) 139/98 (03/05 0644) SpO2:  [99 %] 99 % (03/05 0607) Weight:  [93.9 kg (207 lb)] 93.9 kg (207 lb) (03/05 16100607)  General  Appearance: Alert Head: Normocephalic, without obvious abnormality, atraumatic Eyes: PERRL, conjunctiva/corneas clear, EOM's intact,    Ears: Normal  Throat: Normal  Neck: Supple, Back: unremarkable Lungs: Clear to auscultation bilaterally, respirations unlabored Heart: Regular rate and rhythm, no murmur, rub or gallop Abdomen: Soft, non-tender Extremities: Extremities normal, atraumatic, no cyanosis or edema Skin: unremarkable  NEUROLOGIC: His hands are weak.  Mental status: alert and oriented,Motor Exam - grossly normal Sensory  Exam - grossly normal, numb hands Reflexes: Hyperreflexic Coordination - grossly normal Gait - grossly normal Balance - grossly normal Cranial Nerves: I: smell Not tested  II: visual acuity  OS: Normal  OD: Normal   II: visual fields Full to confrontation  II: pupils Equal, round, reactive to light  III,VII: ptosis None  III,IV,VI: extraocular muscles  Full ROM  V: mastication Normal  V: facial light touch sensation  Normal  V,VII: corneal reflex  Present  VII: facial muscle function - upper  Normal  VII: facial muscle function - lower Normal  VIII: hearing Not tested  IX: soft palate elevation  Normal  IX,X: gag reflex Present  XI: trapezius strength  5/5  XI: sternocleidomastoid strength 5/5  XI: neck flexion strength  5/5  XII: tongue strength  Normal    Data Review Lab Results  Component Value Date   WBC 9.5 01/11/2017   HGB 14.4 01/11/2017   HCT 42.1 01/11/2017   MCV 85.9 01/11/2017   PLT 294 01/11/2017   Lab Results  Component Value Date   NA 136 01/11/2017   K 4.2 01/11/2017   CL 104 01/11/2017   CO2 20 (L) 01/11/2017   BUN 13 01/11/2017   CREATININE 0.87 01/11/2017   GLUCOSE 112 (H) 01/11/2017   No results found for: INR, PROTIME  Assessment/Plan: C3-4, C4-5, and C5-6, C6-7 spondylosis, stenosis, cervicalgia, cervical myelopathy: I have discussed the situation with the patient. I reviewed his imaging studies with him and pointed out the abnormalities. We have discussed the various treatment options including surgery. I have described the surgical treatment option of a C3-4, C4-5, C5-6 and C6-7 anterior cervical discectomy, fusion, and plating. We have discussed the risks, benefits, alternatives, expected postoperative course, and likelihood of achieving goals with surgery. I have answered all his questions. He has decided to proceed with surgery.   Nimra Puccinelli D 01/21/2017 7:27 AM

## 2017-01-21 NOTE — Progress Notes (Signed)
Subjective:  I saw the patient in the PACU.  He looks well.  He is in no apparent distress.  Objective: Vital signs in last 24 hours: Temp:  [97.3 F (36.3 C)-98.9 F (37.2 C)] 98.6 F (37 C) (03/05 1608) Pulse Rate:  [86-115] 108 (03/05 1608) Resp:  [12-20] 16 (03/05 1608) BP: (129-148)/(89-105) 129/92 (03/05 1608) SpO2:  [93 %-99 %] 97 % (03/05 1608) Weight:  [93.9 kg (207 lb)] 93.9 kg (207 lb) (03/05 0607)  Intake/Output from previous day: No intake/output data recorded. Intake/Output this shift: Total I/O In: 1750 [I.V.:1500; IV Piggyback:250] Out: 877 [Urine:700; Drains:2; Blood:175]  Physical exam the patient is alert and pleasant.  He is moving all 4 extremities well.  His dressing is clean and dry.  There is no hematoma or shift.  Lab Results: No results for input(s): WBC, HGB, HCT, PLT in the last 72 hours. BMET No results for input(s): NA, K, CL, CO2, GLUCOSE, BUN, CREATININE, CALCIUM in the last 72 hours.  Studies/Results: Dg Cervical Spine 2-3 Views  Result Date: 01/21/2017 CLINICAL DATA:  C3-C7 ACDF EXAM: CERVICAL SPINE - 2-3 VIEW COMPARISON:  None. FINDINGS: First lateral intraoperative image demonstrates anterior localizing instrument at C4-5 Second lateral intraoperative image demonstrates anterior plate and fusion changes from C3-C7. The C7 vertebral body cannot be visualized due to overlying shoulders. Alignment otherwise normal. No visible hardware complicating feature. IMPRESSION: C3-C7 ACDF. The lower cervical spine cannot be visualized due to overlying shoulders. No visible complicating feature. Electronically Signed   By: Charlett NoseKevin  Dover M.D.   On: 01/21/2017 12:20    Assessment/Plan: The patient is doing well.  I spoke with his wife.  LOS: 0 days     Shateria Paternostro D 01/21/2017, 4:15 PM

## 2017-01-21 NOTE — Op Note (Signed)
Brief history: The patient is a 56 year old black male who has complained of weakness. He was worked up with a cervical MRI which demonstrated multilevel spondylosis and spinal stenosis. I discussed situation with the patient. We discussed the various treatment options. He has decided to proceed with surgery after weighing the risks, benefits, and alternatives.  Preoperative diagnosis: C3-4, C4-5, C5-6 and C6-7 disc degeneration, spondylosis, stenosis, cervical myelopathy, cervicalgia  Postoperative diagnosis: The same  Procedure: C3-4, C4-5, C5-6 and C6-7 Anterior cervical discectomy/decompression; C3-4, C4-5, C5-6 and C6-7 interbody arthrodesis with local morcellized autograft bone and Kinnex bone graft extender; insertion of interbody prosthesis at the 34, C4-5, C5-6 and C6-7 (Zimmer peek interbody prosthesis); anterior cervical plating from C3-C7 with globus titanium plate  Surgeon: Dr. Delma OfficerJeff Rane Dumm  Asst.: Dr. Danielle DessElsner  Anesthesia: Gen. endotracheal  Estimated blood loss: 150 mL  Drains: One 10 mm flat Jackson-Pratt drain in the prevertebral space  Complications: None  Description of procedure: The patient was brought to the operating room by the anesthesia team. General endotracheal anesthesia was induced. A roll was placed under the patient's shoulders to keep the neck in the neutral position. The patient's anterior cervical region was then prepared with Betadine scrub and Betadine solution. Sterile drapes were applied.  The area to be incised was then injected with Marcaine with epinephrine solution. I then used a scalpel to make a transverse incision in the patient's left anterior neck. I used the Metzenbaum scissors to divide the platysmal muscle and then to dissect medial to the sternocleidomastoid muscle, jugular vein, and carotid artery. I carefully dissected down towards the anterior cervical spine identifying the esophagus and retracting it medially. Then using Kitner swabs to  clear soft tissue from the anterior cervical spine. We then inserted a bent spinal needle into the upper exposed intervertebral disc space. We then obtained intraoperative radiographs confirm our location.  I then used electrocautery to detach the medial border of the longus colli muscle bilaterally from the C3-4, C4-5, C5-6 and C6-7 intervertebral disc spaces. I then inserted the Caspar self-retaining retractor underneath the longus colli muscle bilaterally to provide exposure.  We then incised the intervertebral disc at C4-5. We then performed a partial intervertebral discectomy with a pituitary forceps and the Karlin curettes. I then inserted distraction screws into the vertebral bodies at C4 and C5. We then distracted the interspace. We then used the high-speed drill to decorticate the vertebral endplates at C4-5, to drill away the remainder of the intervertebral disc, to drill away some posterior spondylosis, and to thin out the posterior longitudinal ligament. I then incised ligament with the arachnoid knife. We then removed the ligament with a Kerrison punches undercutting the vertebral endplates and decompressing the thecal sac. We then performed foraminotomies about the bilateral C5 nerve roots. This completed the decompression at this level.  We then repeated this procedure and analogous fashion at C3-4, C5-6 and C6-7 compressing the thecal sac as well as the bilateral C4, C6 and C7 nerve roots  We now turned our to attention to the interbody fusion. We used the trial spacers to determine the appropriate size for the interbody prosthesis. We then pre-filled prosthesis with a combination of local morcellized autograft bone that we obtained during decompression as well as Kinnex bone graft extender. We then inserted the prosthesis into the distracted interspace at C3-4, C4-5, C5-6 and C6-7. We then removed the distraction screws. There was a good snug fit of the prosthesis in the  interspace.  Having completed the  fusion we now turned attention to the anterior spinal instrumentation. We used the high-speed drill to drill away some anterior spondylosis at the disc spaces so that the plate lay down flat. We selected the appropriate length titanium anterior cervical plate. We laid it along the anterior aspect of the vertebral bodies from C3-C7. We then drilled 14 mm holes at C3, C4, C5, C6, and C7. We then secured the plate to the vertebral bodies by placing two 14 mm self-tapping screws at C3, C4, C5, C6 and C7. We then obtained intraoperative radiograph. The demonstrating good position of the instrumentation. We therefore secured the screws the plate the locking each cam. This completed the instrumentation.  We then obtained hemostasis using bipolar electrocautery. We irrigated the wound out with bacitracin solution. We then removed the retractor. We placed a 10 mm flat Jackson-Pratt drain in the prevertebral space and tunneled it out through a separate stab wound. We inspected the esophagus for any damage. There was none apparent. We then reapproximated patient's platysmal muscle with interrupted 3-0 Vicryl suture. We then reapproximated the subcutaneous tissue with interrupted 3-0 Vicryl suture. The skin was reapproximated with Steri-Strips and benzoin. The wound was then covered with bacitracin ointment. A sterile dressing was applied. The drapes were removed. Patient was subsequently extubated by the anesthesia team and transported to the post anesthesia care unit in stable condition. All sponge instrument and needle counts were reportedly correct at the end of this case.

## 2017-01-21 NOTE — Anesthesia Postprocedure Evaluation (Addendum)
Anesthesia Post Note  Patient: Warden FillersRicky C Malstrom  Procedure(s) Performed: Procedure(s) (LRB): ANTERIOR CERVICAL DECOMPRESSION/DISCECTOMY FUSION , INTERBODY PROSTHESIS,PLATE CERVICAL THREE- CERVICAL FOUR,CERVICAL FOUR- CERVICAL FIVE, CERVICAL FIVE- CERVICAL SIX, CERVICAL SIX- CERVICAL SEVEN (N/A)  Patient location during evaluation: PACU Anesthesia Type: General Level of consciousness: awake and alert Pain management: pain level controlled Vital Signs Assessment: post-procedure vital signs reviewed and stable Respiratory status: spontaneous breathing, nonlabored ventilation, respiratory function stable and patient connected to nasal cannula oxygen Cardiovascular status: blood pressure returned to baseline and stable Postop Assessment: no signs of nausea or vomiting Anesthetic complications: no       Last Vitals:  Vitals:   01/21/17 1310 01/21/17 1324  BP:  (!) 142/93  Pulse: (!) 110 (!) 113  Resp:  16  Temp:  37.2 C    Last Pain:  Vitals:   01/21/17 1310  TempSrc:   PainSc: 4                  Shelton SilvasKevin D Hollis

## 2017-01-21 NOTE — Transfer of Care (Signed)
Immediate Anesthesia Transfer of Care Note  Patient: Connor [name]  Procedure(s) Performed: Procedure(s): ANTERIOR CERVICAL DECOMPRESSION/DISCECTOMY FUSION , INTERBODY PROSTHESIS,PLATE CERVICAL THREE- CERVICAL FOUR,CERVICAL FOUR- CERVICAL FIVE, CERVICAL FIVE- CERVICAL SIX, CERVICAL SIX- CERVICAL SEVEN (N/A)  Patient Location: PACU  Anesthesia Type:General  Level of Consciousness: awake, alert , oriented and patient cooperative  Airway & Oxygen Therapy: Patient Spontanous Breathing and Patient connected to nasal cannula oxygen  Post-op Assessment: Report given to RN and Post -op Vital signs reviewed and stable, Moving extremities x 4  Post vital signs: Reviewed and stable  Last Vitals:  Vitals:   01/21/17 0644 01/21/17 1231  BP: (!) 139/98 137/90  Pulse:  (!) 115  Resp:  17  Temp:  36.3 C    Last Pain:  Vitals:   01/21/17 1231  TempSrc:   PainSc: 0-No pain      Patients Stated Pain Goal: 3 (01/21/17 [id number])  Complications: No apparent anesthesia complications

## 2017-01-21 NOTE — Anesthesia Procedure Notes (Addendum)
Procedure Name: Intubation Date/Time: 01/21/2017 7:47 AM Performed by: Annabelle HarmanSMITH, Jacquel Mccamish A Pre-anesthesia Checklist: Patient identified, Emergency Drugs available, Suction available and Patient being monitored Patient Re-evaluated:Patient Re-evaluated prior to inductionOxygen Delivery Method: Circle system utilized Preoxygenation: Pre-oxygenation with 100% oxygen Intubation Type: IV induction Ventilation: Mask ventilation without difficulty and Oral airway inserted - appropriate to patient size Laryngoscope Size: Glidescope and 4 Grade View: Grade I Tube type: Oral Tube size: 8.0 mm Number of attempts: 1 Airway Equipment and Method: Video-laryngoscopy and Rigid stylet Placement Confirmation: ETT inserted through vocal cords under direct vision,  positive ETCO2 and breath sounds checked- equal and bilateral Secured at: 24 cm Tube secured with: Tape Dental Injury: Teeth and Oropharynx as per pre-operative assessment  Difficulty Due To: Difficult Airway- due to reduced neck mobility and Difficulty was anticipated

## 2017-01-22 LAB — GLUCOSE, CAPILLARY: Glucose-Capillary: 143 mg/dL — ABNORMAL HIGH (ref 65–99)

## 2017-01-22 MED ORDER — OXYCODONE HCL 5 MG PO TABS: 5.0000 mg | ORAL_TABLET | ORAL | 0 refills | Status: DC | PRN

## 2017-01-22 MED ORDER — DOCUSATE SODIUM 100 MG PO CAPS: 100.0000 mg | ORAL_CAPSULE | Freq: Two times a day (BID) | ORAL | 0 refills | Status: AC

## 2017-01-22 MED ORDER — CYCLOBENZAPRINE HCL 10 MG PO TABS: 10.0000 mg | ORAL_TABLET | Freq: Three times a day (TID) | ORAL | 1 refills | Status: DC | PRN

## 2017-01-22 MED FILL — Thrombin For Soln 5000 Unit: CUTANEOUS | Qty: 5000 | Status: AC

## 2017-01-22 NOTE — Progress Notes (Signed)
Patient alert and oriented, mae's well, voiding adequate amount of urine, swallowing without difficulty, no c/o pain at time of discharge. Patient discharged home with family. Script and discharged instructions given to patient. Patient and family stated understanding of instructions given. Patient has an appointment with Dr. Jenkins   

## 2017-01-22 NOTE — Discharge Summary (Signed)
Physician Discharge Summary  Patient ID: Connor [name] MRN: [id number] DOB/AGE: 01/18/1961 56 y.o.  Admit date: 01/21/2017 Discharge date: 01/22/2017  Admission Diagnoses:Cervical spondylosis, cervical myelopathy, cervical spinal stenosis  Discharge Diagnoses: The same Active Problems:   Cervical spondylosis with myelopathy   Discharged Condition: good  Hospital Course: I performed a C3-4, C4-5, C5-6 and C6-7 anterior cervical discectomy, fusion, and plating on the patient on 01/21/2017. The surgery went well.  The patient's postoperative course was unremarkable. His pre-existing deltoid and bicep weakness persisted after surgery.  On postoperative day #1 the patient looks and feels well. He requested discharge to home. He was given written and oral discharge instructions. He was instructed on shoulder range of motion activities. Office questions were answered.  Consults: None Significant Diagnostic Studies: None Treatments: C3-4, C4-5, C5-6 and C6-7 anterior cervical discectomy, fusion, and plating. Discharge Exam: Blood pressure (!) 145/97, pulse 92, temperature 98.4 F (36.9 C), temperature source Tympanic, resp. rate 17, weight 93.9 kg (207 lb), SpO2 99 %. The patient is alert and pleasant. His dressing is clean and dry. There is no hematoma or shift. I removed the drain. The patient has weakness in his bilateral deltoid and biceps. His strength in his triceps and grip and lower extremities is normal.  Disposition: Home  Discharge Instructions    Call MD for:  difficulty breathing, headache or visual disturbances    Complete by:  As directed    Call MD for:  extreme fatigue    Complete by:  As directed    Call MD for:  hives    Complete by:  As directed    Call MD for:  persistant dizziness or light-headedness    Complete by:  As directed    Call MD for:  persistant nausea and vomiting    Complete by:  As directed    Call MD for:  redness, tenderness, or signs of infection  (pain, swelling, redness, odor or green/yellow discharge around incision site)    Complete by:  As directed    Call MD for:  severe uncontrolled pain    Complete by:  As directed    Call MD for:  temperature >100.4    Complete by:  As directed    Diet - low sodium heart healthy    Complete by:  As directed    Discharge instructions    Complete by:  As directed    Call [phone] for a followup appointment. Take a stool softener while you are using pain medications.   Driving Restrictions    Complete by:  As directed    Do not drive for 2 weeks.   Increase activity slowly    Complete by:  As directed    Lifting restrictions    Complete by:  As directed    Do not lift more than 5 pounds. No excessive bending or twisting.   May shower / Bathe    Complete by:  As directed    He may shower after the pain she is removed 3 days after surgery. Leave the incision alone.   Remove dressing in 48 hours    Complete by:  As directed    Your stitches are under the scan and will dissolve by themselves. The Steri-Strips will fall off after you take a few showers. Do not rub back or pick at the wound, Leave the wound alone.     Allergies as of 01/22/2017      Reactions   No Known Allergies  Medication List    STOP taking these medications   meloxicam 15 MG tablet Commonly known as:  MOBIC   naproxen sodium 220 MG tablet Commonly known as:  ANAPROX   OVER THE COUNTER MEDICATION   sildenafil 20 MG tablet Commonly known as:  REVATIO     TAKE these medications   atorvastatin 40 MG tablet Commonly known as:  LIPITOR TAKE 1 TABLET DAILY What changed:  See the new instructions.   cyclobenzaprine 10 MG tablet Commonly known as:  FLEXERIL Take 1 tablet (10 mg total) by mouth 3 (three) times daily as needed for muscle spasms. What changed:  Another medication with the same name was added. Make sure you understand how and when to take each.   cyclobenzaprine 10 MG tablet Commonly  known as:  FLEXERIL Take 1 tablet (10 mg total) by mouth 3 (three) times daily as needed for muscle spasms. What changed:  You were already taking a medication with the same name, and this prescription was added. Make sure you understand how and when to take each.   docusate sodium 100 MG capsule Commonly known as:  COLACE Take 1 capsule (100 mg total) by mouth 2 (two) times daily.   glipiZIDE 10 MG 24 hr tablet Commonly known as:  GLIPIZIDE XL Take 2 tablets (20 mg total) by mouth daily.   hydrochlorothiazide 25 MG tablet Commonly known as:  HYDRODIURIL Take 1 tablet (25 mg total) by mouth daily.   lisinopril 20 MG tablet Commonly known as:  PRINIVIL,ZESTRIL Take 1 tablet (20 mg total) by mouth daily.   metFORMIN 1000 MG tablet Commonly known as:  GLUCOPHAGE Take 1 tablet (1,000 mg total) by mouth 2 (two) times daily. With a meal   multivitamin with minerals Tabs tablet Take 1 tablet by mouth daily.   omeprazole 40 MG capsule Commonly known as:  PRILOSEC Take 1 capsule (40 mg total) by mouth daily.   oxyCODONE 5 MG immediate release tablet Commonly known as:  Oxy IR/ROXICODONE Take 1-2 tablets (5-10 mg total) by mouth every 4 (four) hours as needed for breakthrough pain.   tadalafil 20 MG tablet Commonly known as:  CIALIS Take by mouth as needed. Reported on 04/23/2016        Signed: Tressie Stalker D 01/22/2017, 7:40 AM

## 2017-01-24 ENCOUNTER — Encounter (HOSPITAL_COMMUNITY): Payer: Self-pay | Admitting: Neurosurgery

## 2017-02-12 DIAGNOSIS — M4712 Other spondylosis with myelopathy, cervical region: Secondary | ICD-10-CM | POA: Diagnosis not present

## 2017-02-22 ENCOUNTER — Encounter: Payer: Self-pay | Admitting: Emergency Medicine

## 2017-02-22 ENCOUNTER — Emergency Department: Payer: Medicare HMO

## 2017-02-22 ENCOUNTER — Emergency Department
Admission: EM | Admit: 2017-02-22 | Discharge: 2017-02-22 | Disposition: A | Discharge status: Home / Self Care | Payer: Medicare HMO | Attending: Emergency Medicine | Admitting: Emergency Medicine

## 2017-02-22 DIAGNOSIS — Y92410 Unspecified street and highway as the place of occurrence of the external cause: Secondary | ICD-10-CM | POA: Diagnosis not present

## 2017-02-22 DIAGNOSIS — Y999 Unspecified external cause status: Secondary | ICD-10-CM | POA: Diagnosis not present

## 2017-02-22 DIAGNOSIS — Y9389 Activity, other specified: Secondary | ICD-10-CM | POA: Diagnosis not present

## 2017-02-22 DIAGNOSIS — S161XXA Strain of muscle, fascia and tendon at neck level, initial encounter: Secondary | ICD-10-CM | POA: Insufficient documentation

## 2017-02-22 DIAGNOSIS — S199XXA Unspecified injury of neck, initial encounter: Secondary | ICD-10-CM | POA: Diagnosis not present

## 2017-02-22 DIAGNOSIS — S0990XA Unspecified injury of head, initial encounter: Secondary | ICD-10-CM | POA: Diagnosis not present

## 2017-02-22 DIAGNOSIS — E119 Type 2 diabetes mellitus without complications: Secondary | ICD-10-CM | POA: Insufficient documentation

## 2017-02-22 DIAGNOSIS — M549 Dorsalgia, unspecified: Secondary | ICD-10-CM | POA: Diagnosis not present

## 2017-02-22 DIAGNOSIS — I10 Essential (primary) hypertension: Secondary | ICD-10-CM | POA: Insufficient documentation

## 2017-02-22 DIAGNOSIS — S0003XA Contusion of scalp, initial encounter: Secondary | ICD-10-CM | POA: Insufficient documentation

## 2017-02-22 DIAGNOSIS — M542 Cervicalgia: Secondary | ICD-10-CM | POA: Diagnosis not present

## 2017-02-22 MED ORDER — HYDROMORPHONE HCL 1 MG/ML IJ SOLN
1.0000 mg | Freq: Once | INTRAMUSCULAR | Status: AC
  Administered 2017-02-22: 1 mg via INTRAVENOUS
  Filled 2017-02-22: qty 1

## 2017-02-22 MED ORDER — ONDANSETRON HCL 4 MG/2ML IJ SOLN
4.0000 mg | Freq: Once | INTRAMUSCULAR | Status: AC
  Administered 2017-02-22: 4 mg via INTRAVENOUS
  Filled 2017-02-22: qty 2

## 2017-02-22 MED ORDER — DIAZEPAM 2 MG PO TABS: 2.0000 mg | ORAL_TABLET | Freq: Four times a day (QID) | ORAL | 0 refills | Status: DC | PRN
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
## 2017-02-22 NOTE — Discharge Instructions (Signed)
Follow-up with Dr. Lovell Sheehan if any continued problems with your  neck. Continue taking your pain medication at home. Valium 2 mg 4 times a day as needed for muscle spasms for the next 3 days. You may use ice or heat to your neck muscles as needed for discomfort.  Be aware that taking your pain medication along with a muscle relaxant could increase your risk for falling and for drowsiness.
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
## 2017-03-04 ENCOUNTER — Telehealth: Payer: Self-pay | Admitting: Family Medicine

## 2017-03-04 ENCOUNTER — Encounter: Payer: Self-pay | Admitting: Family Medicine

## 2017-03-04 ENCOUNTER — Other Ambulatory Visit: Payer: Self-pay | Admitting: Family Medicine

## 2017-03-04 ENCOUNTER — Ambulatory Visit (INDEPENDENT_AMBULATORY_CARE_PROVIDER_SITE_OTHER): Payer: Medicare HMO | Admitting: Family Medicine

## 2017-03-04 VITALS — BP 122/90 | HR 85 | Temp 97.5°F | Resp 16 | Wt 201.2 lb

## 2017-03-04 DIAGNOSIS — S46819A Strain of other muscles, fascia and tendons at shoulder and upper arm level, unspecified arm, initial encounter: Secondary | ICD-10-CM | POA: Diagnosis not present

## 2017-03-04 MED ORDER — DIAZEPAM 2 MG PO TABS: 2.0000 mg | ORAL_TABLET | Freq: Four times a day (QID) | ORAL | 1 refills | Status: DC | PRN

## 2017-03-04 NOTE — Telephone Encounter (Signed)
Please review

## 2017-03-04 NOTE — Telephone Encounter (Signed)
Pt was in today. He said he needed the Valium to be sent to [facility] in [place].  It appears to be sent to [facility] mail order.  Thank sTeri

## 2017-03-04 NOTE — Patient Instructions (Addendum)
Increase Aleve to two pills twice daily with food. Warm compresses for 20 minutes several x day. We will call you about the physical therapy referral.
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
## 2017-03-04 NOTE — Telephone Encounter (Signed)
rx was printed but did not get to the patient. Will call in.

## 2017-03-26 ENCOUNTER — Ambulatory Visit: Payer: Medicare HMO | Attending: Family Medicine

## 2017-03-26 DIAGNOSIS — M6281 Muscle weakness (generalized): Secondary | ICD-10-CM | POA: Diagnosis not present

## 2017-03-26 DIAGNOSIS — M542 Cervicalgia: Secondary | ICD-10-CM | POA: Diagnosis not present
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
## 2017-03-26 NOTE — Patient Instructions (Signed)
Reviewed and gave standing shoulder flexion isometrics at wall, arms straight, 10x3 with 5 second holds each UE daily,   And seated table slides flexion AAROM (hands in pillow cases for easier sliding) 10x3 daily as part of his HEP. Pt demonstrated and verbalized understanding.

## 2017-04-02 ENCOUNTER — Ambulatory Visit: Payer: Medicare HMO

## 2017-04-02 DIAGNOSIS — M542 Cervicalgia: Secondary | ICD-10-CM | POA: Diagnosis not present

## 2017-04-02 DIAGNOSIS — M6281 Muscle weakness (generalized): Secondary | ICD-10-CM | POA: Diagnosis not present
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
## 2017-04-02 NOTE — Patient Instructions (Addendum)
  Gave seated AAROM shoulder flexion and scaption using SPC (tip on floor) 10x3 daily as part of his HEP. Pt demonstrated and verbalized understanding.

## 2017-04-04 ENCOUNTER — Ambulatory Visit: Payer: Medicare HMO

## 2017-04-08 ENCOUNTER — Ambulatory Visit: Payer: Medicare HMO

## 2017-04-10 ENCOUNTER — Ambulatory Visit: Payer: Medicare HMO

## 2017-04-10 DIAGNOSIS — M6281 Muscle weakness (generalized): Secondary | ICD-10-CM

## 2017-04-10 DIAGNOSIS — M542 Cervicalgia: Secondary | ICD-10-CM

## 2017-04-10 NOTE — Patient Instructions (Signed)
   On your back, holding a light bar:  Arms supported by a folded pillow,   Elbows bent to 90 degrees.    Rotate the bar back and forth to the sides   Repeat 10 times   Perform 3 sets daily.      On your back, holding a light bar with both hands:   Perform a bench press   Repeat 10 times   Perform 3 sets daily.     On your back, holding a light bar with both hands:   Perform biceps flexion 10 times   Perform 3 sets daily.
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
## 2017-04-17 ENCOUNTER — Ambulatory Visit: Payer: Medicare HMO

## 2017-04-17 DIAGNOSIS — M542 Cervicalgia: Secondary | ICD-10-CM | POA: Diagnosis not present

## 2017-04-17 DIAGNOSIS — M6281 Muscle weakness (generalized): Secondary | ICD-10-CM | POA: Diagnosis not present
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
## 2017-04-19 DIAGNOSIS — M4712 Other spondylosis with myelopathy, cervical region: Secondary | ICD-10-CM | POA: Diagnosis not present

## 2017-04-19 DIAGNOSIS — I10 Essential (primary) hypertension: Secondary | ICD-10-CM | POA: Diagnosis not present

## 2017-04-19 DIAGNOSIS — Z6829 Body mass index (BMI) 29.0-29.9, adult: Secondary | ICD-10-CM | POA: Diagnosis not present

## 2017-04-19 NOTE — Addendum Note (Signed)
Addendum  created 04/19/17 1059 by Shelton SilvasHollis, Gardner Servantes D, MD   Sign clinical note

## 2017-04-24 ENCOUNTER — Ambulatory Visit: Payer: Medicare HMO | Attending: Family Medicine

## 2017-04-24 DIAGNOSIS — M542 Cervicalgia: Secondary | ICD-10-CM | POA: Insufficient documentation

## 2017-04-24 DIAGNOSIS — M6281 Muscle weakness (generalized): Secondary | ICD-10-CM
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
## 2017-04-24 NOTE — Patient Instructions (Signed)
    Lying on your back and starting with your elbow bent:   Raise your arm up to the ceiling.    Perform 5 times,    Repeat 3 times per day for each arm.

## 2017-04-29 ENCOUNTER — Ambulatory Visit: Payer: Medicare HMO

## 2017-05-01 ENCOUNTER — Ambulatory Visit: Payer: Medicare HMO

## 2017-05-01 DIAGNOSIS — M6281 Muscle weakness (generalized): Secondary | ICD-10-CM | POA: Diagnosis not present

## 2017-05-01 DIAGNOSIS — M542 Cervicalgia: Secondary | ICD-10-CM

## 2017-05-01 NOTE — Patient Instructions (Signed)
  Resisted External Rotation: in Neutral - Bilateral    On your back, yellow band in both hands, elbows at sides, bent to 90, forearms forward. Pinch shoulder blades together and rotate forearms out. Keep elbows at sides. Repeat _10___ times per set. Do _1___ sets per session. Do __3__ sessions per day.  http://orth.exer.us/966   Copyright  VHI. All rights reserved.
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
## 2017-05-03 ENCOUNTER — Other Ambulatory Visit: Payer: Self-pay | Admitting: Family Medicine

## 2017-05-03 DIAGNOSIS — E785 Hyperlipidemia, unspecified: Principal | ICD-10-CM

## 2017-05-03 DIAGNOSIS — E1169 Type 2 diabetes mellitus with other specified complication: Secondary | ICD-10-CM

## 2017-05-03 MED ORDER — ATORVASTATIN CALCIUM 40 MG PO TABS: 40.0000 mg | ORAL_TABLET | Freq: Every day | ORAL | 1 refills | Status: DC

## 2017-05-08 ENCOUNTER — Ambulatory Visit: Payer: Medicare HMO

## 2017-05-08 DIAGNOSIS — M6281 Muscle weakness (generalized): Secondary | ICD-10-CM

## 2017-05-08 DIAGNOSIS — M542 Cervicalgia: Secondary | ICD-10-CM | POA: Diagnosis not present
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
## 2017-05-16 ENCOUNTER — Ambulatory Visit: Payer: Medicare HMO

## 2017-05-16 ENCOUNTER — Other Ambulatory Visit: Payer: Self-pay | Admitting: Family Medicine

## 2017-05-16 DIAGNOSIS — M6281 Muscle weakness (generalized): Secondary | ICD-10-CM | POA: Diagnosis not present

## 2017-05-16 DIAGNOSIS — E119 Type 2 diabetes mellitus without complications: Secondary | ICD-10-CM

## 2017-05-16 DIAGNOSIS — M542 Cervicalgia: Secondary | ICD-10-CM

## 2017-05-16 NOTE — Patient Instructions (Addendum)
   Scapular Retraction (Standing)   With arms at sides, pinch shoulder blades together, trying to place the corner of your shoulder blades to your back pockets.  Hold for 5 seconds. Repeat __10__ times per set. Do __3__ sets per session.     Copyright  VHI. All rights reserved.      Sitting on a chair: clasp your hands together:    Perform bicep curls.    5 times    3 sets daily.
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
## 2017-05-20 ENCOUNTER — Ambulatory Visit: Payer: Self-pay | Admitting: Family Medicine

## 2017-05-21 ENCOUNTER — Ambulatory Visit: Payer: Medicare HMO

## 2017-05-21 ENCOUNTER — Telehealth: Payer: Self-pay

## 2017-05-21 NOTE — Telephone Encounter (Signed)
No show. Called home phone number (cell phone number mailbox full) and left a message pertaining to his appointment and a reminder for his next follow up session. Return phone call requested. Phone number provided [phone]).

## 2017-05-23 ENCOUNTER — Ambulatory Visit (INDEPENDENT_AMBULATORY_CARE_PROVIDER_SITE_OTHER): Payer: Medicare HMO | Admitting: Family Medicine

## 2017-05-23 VITALS — BP 132/84 | HR 84 | Temp 97.8°F | Resp 14 | Wt 201.0 lb

## 2017-05-23 DIAGNOSIS — I10 Essential (primary) hypertension: Secondary | ICD-10-CM

## 2017-05-23 DIAGNOSIS — E119 Type 2 diabetes mellitus without complications: Secondary | ICD-10-CM | POA: Diagnosis not present

## 2017-05-23 LAB — POCT GLYCOSYLATED HEMOGLOBIN (HGB A1C): HEMOGLOBIN A1C: 7.3

## 2017-05-23 MED ORDER — METFORMIN HCL 850 MG PO TABS: ORAL_TABLET | ORAL | 1 refills | Status: DC
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
## 2017-05-23 NOTE — Patient Instructions (Signed)
Give the physical therapist more time to work with you. If he decides you are no longer benefiting before the 6 weeks are through-have him notify me or put in his final note.

## 2017-05-28 ENCOUNTER — Ambulatory Visit: Payer: Medicare HMO

## 2017-06-05 ENCOUNTER — Ambulatory Visit: Payer: Medicare HMO | Attending: Family Medicine

## 2017-06-05 DIAGNOSIS — M6281 Muscle weakness (generalized): Secondary | ICD-10-CM

## 2017-06-05 DIAGNOSIS — M542 Cervicalgia: Secondary | ICD-10-CM | POA: Diagnosis not present
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
## 2017-06-13 ENCOUNTER — Other Ambulatory Visit: Payer: Self-pay | Admitting: Family Medicine

## 2017-06-25 ENCOUNTER — Telehealth: Payer: Self-pay | Admitting: Family Medicine

## 2017-06-25 NOTE — Telephone Encounter (Signed)
Last Tdap given 04/16/14. Please advise.sd

## 2017-06-25 NOTE — Telephone Encounter (Signed)
Pt wife is requesting an appointment for pt to have a  TDAP due to daughter in law is having a baby.  [phone]/WJCB#[phone]/MW

## 2017-06-25 NOTE — Telephone Encounter (Signed)
lmtcb

## 2017-06-25 NOTE — Telephone Encounter (Signed)
He is covered for pertussis with Tdap 2015.

## 2017-06-26 NOTE — Telephone Encounter (Signed)
Advised patient of Tdap.KW

## 2017-07-09 ENCOUNTER — Other Ambulatory Visit: Payer: Self-pay | Admitting: Family Medicine

## 2017-07-09 DIAGNOSIS — E119 Type 2 diabetes mellitus without complications: Secondary | ICD-10-CM

## 2017-07-16 ENCOUNTER — Telehealth: Payer: Self-pay

## 2017-07-16 NOTE — Telephone Encounter (Signed)
Misty from [facility] is calling that they faxed over a prescription request 8/21 for the following Medication Diazepam 2 mg? And they have not heard from [place]? Please review.  Thanks,  -Katiana Ruland

## 2017-07-16 NOTE — Telephone Encounter (Signed)
Please reivew. It looks like this medication is not in his current list of medication. -aa

## 2017-07-17 NOTE — Telephone Encounter (Signed)
Let [employer] know I am discontinuing diazepam

## 2017-07-17 NOTE — Telephone Encounter (Signed)
Pharmacist was advised. KW 

## 2017-08-24 ENCOUNTER — Ambulatory Visit: Payer: Medicare HMO

## 2017-09-16 ENCOUNTER — Other Ambulatory Visit: Payer: Self-pay | Admitting: Family Medicine

## 2017-09-16 DIAGNOSIS — E119 Type 2 diabetes mellitus without complications: Secondary | ICD-10-CM

## 2017-09-17 DIAGNOSIS — H524 Presbyopia: Secondary | ICD-10-CM | POA: Diagnosis not present

## 2017-10-23 ENCOUNTER — Other Ambulatory Visit: Payer: Self-pay | Admitting: Family Medicine

## 2017-10-23 DIAGNOSIS — E1169 Type 2 diabetes mellitus with other specified complication: Secondary | ICD-10-CM

## 2017-10-23 DIAGNOSIS — E785 Hyperlipidemia, unspecified: Secondary | ICD-10-CM

## 2017-10-23 DIAGNOSIS — I10 Essential (primary) hypertension: Secondary | ICD-10-CM

## 2017-10-23 DIAGNOSIS — E119 Type 2 diabetes mellitus without complications: Secondary | ICD-10-CM

## 2017-11-18 DIAGNOSIS — M4712 Other spondylosis with myelopathy, cervical region: Secondary | ICD-10-CM | POA: Diagnosis not present

## 2017-11-18 DIAGNOSIS — I10 Essential (primary) hypertension: Secondary | ICD-10-CM | POA: Diagnosis not present

## 2017-11-18 DIAGNOSIS — G5603 Carpal tunnel syndrome, bilateral upper limbs: Secondary | ICD-10-CM | POA: Diagnosis not present

## 2017-11-18 DIAGNOSIS — Z6829 Body mass index (BMI) 29.0-29.9, adult: Secondary | ICD-10-CM | POA: Diagnosis not present

## 2017-11-29 ENCOUNTER — Other Ambulatory Visit: Payer: Self-pay | Admitting: Family Medicine

## 2017-11-29 DIAGNOSIS — E119 Type 2 diabetes mellitus without complications: Secondary | ICD-10-CM

## 2017-12-10 ENCOUNTER — Other Ambulatory Visit: Payer: Self-pay | Admitting: Neurosurgery

## 2017-12-13 ENCOUNTER — Ambulatory Visit (INDEPENDENT_AMBULATORY_CARE_PROVIDER_SITE_OTHER): Payer: Medicare HMO | Admitting: Family Medicine

## 2017-12-13 ENCOUNTER — Encounter: Payer: Self-pay | Admitting: Family Medicine

## 2017-12-13 VITALS — BP 144/84 | HR 71 | Temp 98.0°F | Resp 16 | Wt 204.0 lb

## 2017-12-13 DIAGNOSIS — E119 Type 2 diabetes mellitus without complications: Secondary | ICD-10-CM | POA: Diagnosis not present

## 2017-12-13 DIAGNOSIS — G5603 Carpal tunnel syndrome, bilateral upper limbs: Secondary | ICD-10-CM | POA: Diagnosis not present

## 2017-12-13 DIAGNOSIS — I10 Essential (primary) hypertension: Secondary | ICD-10-CM

## 2017-12-13 LAB — POCT GLYCOSYLATED HEMOGLOBIN (HGB A1C): Hemoglobin A1C: 8.3

## 2017-12-13 NOTE — Progress Notes (Signed)
Subjective:     Patient ID: Connor [name], male   DOB: 10/15/1961, 57 y.o.   MRN: [id number] Wishes to discuss upcoming carpal tunnel surgery 2/25. HPI Concerned about how much convalescence time he will need. Suggested he speak again with his surgeon. He is going to participate in a diabetes clinical research trial in the next month or two as he has in the past. States he has not been eating the right things to keep his sugars down. Reports compliance with all medication.  Review of Systems  Respiratory: Negative for shortness of breath.   Cardiovascular: Negative for chest pain and palpitations.  Endocrine: Negative for polydipsia, polyphagia and polyuria.       Objective:   Physical Exam  Constitutional: He appears well-developed and well-nourished. No distress.  Lungs: clear Heart: RRR without murmur Lower extremities: no edema; pedal pulses intact, sensation to monofilament intact, no wounds noted.     Assessment:    1. Essential hypertension  2. Diabetes mellitus without complication [facility](HCC): clinical trial pending - POCT glycosylated hemoglobin (Hb A1C)  3. Carpal tunnel syndrome on both sides: surgery pending    Plan:    Recheck bp after surgery. F/u diabetes in 3 months if not in a clinical trial.

## 2017-12-13 NOTE — Patient Instructions (Addendum)
Come in for a bp check after you have your surgery. Please focus on diet to get your sugar down. If you don't participate in the diabetes clinical project f/u with me in 3 months.

## 2018-01-07 ENCOUNTER — Encounter (HOSPITAL_COMMUNITY)
Admission: RE | Admit: 2018-01-07 | Discharge: 2018-01-07 | Disposition: A | Discharge status: Home / Self Care | Payer: Medicare HMO | Source: Ambulatory Visit | Attending: Neurosurgery | Admitting: Neurosurgery

## 2018-01-07 ENCOUNTER — Encounter (HOSPITAL_COMMUNITY): Payer: Self-pay

## 2018-01-07 ENCOUNTER — Other Ambulatory Visit: Payer: Self-pay

## 2018-01-07 DIAGNOSIS — Z01810 Encounter for preprocedural cardiovascular examination: Secondary | ICD-10-CM | POA: Insufficient documentation

## 2018-01-07 DIAGNOSIS — E119 Type 2 diabetes mellitus without complications: Secondary | ICD-10-CM | POA: Diagnosis not present

## 2018-01-07 DIAGNOSIS — Z01812 Encounter for preprocedural laboratory examination: Secondary | ICD-10-CM | POA: Insufficient documentation

## 2018-01-07 LAB — CBC
HEMATOCRIT: 41.2 % (ref 39.0–52.0)
Hemoglobin: 14.3 g/dL (ref 13.0–17.0)
MCH: 30.1 pg (ref 26.0–34.0)
MCHC: 34.7 g/dL (ref 30.0–36.0)
MCV: 86.7 fL (ref 78.0–100.0)
Platelets: 254 10*3/uL (ref 150–400)
RBC: 4.75 MIL/uL (ref 4.22–5.81)
RDW: 13.9 % (ref 11.5–15.5)
WBC: 7.7 10*3/uL (ref 4.0–10.5)

## 2018-01-07 LAB — GLUCOSE, CAPILLARY: Glucose-Capillary: 197 mg/dL — ABNORMAL HIGH (ref 65–99)

## 2018-01-07 LAB — BASIC METABOLIC PANEL
Anion gap: 12 (ref 5–15)
BUN: 16 mg/dL (ref 6–20)
CHLORIDE: 105 mmol/L (ref 101–111)
CO2: 21 mmol/L — AB (ref 22–32)
CREATININE: 0.93 mg/dL (ref 0.61–1.24)
Calcium: 9.2 mg/dL (ref 8.9–10.3)
GFR calc non Af Amer: >60 mL/min (ref >60)
Glucose, Bld: 204 mg/dL — ABNORMAL HIGH (ref 65–99)
POTASSIUM: 4.3 mmol/L (ref 3.5–5.1)
SODIUM: 138 mmol/L (ref 135–145)

## 2018-01-07 NOTE — Progress Notes (Signed)
PCP - Anola Gurneyobert Chauvin, PA  Cardiologist - patient denies  Chest x-ray - n/a EKG - 01/07/18 Stress Test - patient denies ECHO - patient denies Cardiac Cath - patient denies  Sleep Study - patient denies    Fasting Blood Sugar - 100-105 Checks Blood Sugar 1 time a day  Anesthesia review: n/a  Patient denies shortness of breath, fever, cough and chest pain at PAT appointment   Patient verbalized understanding of instructions that were given to them at the PAT appointment. Patient was also instructed that they will need to review over the PAT instructions again at home before surgery.

## 2018-01-07 NOTE — Pre-Procedure Instructions (Signed)
RAMY GRETH  01/07/2018      [facility] - [address]), [place] - [address]) [place] [address] Phone: [phone] Fax: [phone]    Your procedure is scheduled on February 25  Report to [facility] Admitting at 1000 A.M.  Call this number if you have problems the morning of surgery:  [phone]   Remember:  Do not eat food or drink liquids after midnight.  Continue all medications as directed by your physician except follow these medication instructions before surgery below   Take these medicines the morning of surgery with A SIP OF WATER  cyclobenzaprine (FLEXERIL) omeprazole (PRILOSEC) oxyCODONE-acetaminophen (PERCOCET/ROXICET)  7 days prior to surgery STOP taking any Aspirin(unless otherwise instructed by your surgeon), Aleve, Naproxen, Ibuprofen, Motrin, Advil, Goody's, BC's, all herbal medications, fish oil, and all vitamins meloxicam (MOBIC)   WHAT DO I DO ABOUT MY DIABETES MEDICATION?   Marland Kitchen Do not take oral diabetes medicines (pills) the morning of surgery. glipiZIDE (GLUCOTROL XL), metFORMIN (GLUCOPHAGE)   How to Manage Your Diabetes Before and After Surgery  Why is it important to control my blood sugar before and after surgery? . Improving blood sugar levels before and after surgery helps healing and can limit problems. . A way of improving blood sugar control is eating a healthy diet by: o  Eating less sugar and carbohydrates o  Increasing activity/exercise o  Talking with your doctor about reaching your blood sugar goals . High blood sugars (greater than 180 mg/dL) can raise your risk of infections and slow your recovery, so you will need to focus on controlling your diabetes during the weeks before surgery. . Make sure that the doctor who takes care of your diabetes knows about your planned surgery including the date and location.  How do I manage my blood sugar before  surgery? . Check your blood sugar at least 4 times a day, starting 2 days before surgery, to make sure that the level is not too high or low. o Check your blood sugar the morning of your surgery when you wake up and every 2 hours until you get to the Short Stay unit. . If your blood sugar is less than 70 mg/dL, you will need to treat for low blood sugar: o Do not take insulin. o Treat a low blood sugar (less than 70 mg/dL) with  cup of clear juice (cranberry or apple), 4 glucose tablets, OR glucose gel. o Recheck blood sugar in 15 minutes after treatment (to make sure it is greater than 70 mg/dL). If your blood sugar is not greater than 70 mg/dL on recheck, call [phone] for further instructions. . Report your blood sugar to the short stay nurse when you get to Short Stay.  . If you are admitted to the hospital after surgery: o Your blood sugar will be checked by the staff and you will probably be given insulin after surgery (instead of oral diabetes medicines) to make sure you have good blood sugar levels. o The goal for blood sugar control after surgery is 80-180 mg/dL.     Do not wear jewelry  Do not wear lotions, powders, or cologne, or deodorant.  Men may shave face and neck.  Do not bring valuables to the hospital.  [facility] is not responsible for any belongings or valuables.  Contacts, dentures or bridgework may not be worn into surgery.  Leave your suitcase in the car.  After  surgery it may be brought to your room.  For patients admitted to the hospital, discharge time will be determined by your treatment team.  Patients discharged the day of surgery will not be allowed to drive home.    Special instructions:   [facility]- Preparing For Surgery  Before surgery, you can play an important role. Because skin is not sterile, your skin needs to be as free of germs as possible. You can reduce the number of germs on your skin by washing with CHG (chlorahexidine gluconate)  Soap before surgery.  CHG is an antiseptic cleaner which kills germs and bonds with the skin to continue killing germs even after washing.  Please do not use if you have an allergy to CHG or antibacterial soaps. If your skin becomes reddened/irritated stop using the CHG.  Do not shave (including legs and underarms) for at least 48 hours prior to first CHG shower. It is OK to shave your face.  Please follow these instructions carefully.   1. Shower the NIGHT BEFORE SURGERY and the MORNING OF SURGERY with CHG.   2. If you chose to wash your hair, wash your hair first as usual with your normal shampoo.  3. After you shampoo, rinse your hair and body thoroughly to remove the shampoo.  4. Use CHG as you would any other liquid soap. You can apply CHG directly to the skin and wash gently with a scrungie or a clean washcloth.   5. Apply the CHG Soap to your body ONLY FROM THE NECK DOWN.  Do not use on open wounds or open sores. Avoid contact with your eyes, ears, mouth and genitals (private parts). Wash Face and genitals (private parts)  with your normal soap.  6. Wash thoroughly, paying special attention to the area where your surgery will be performed.  7. Thoroughly rinse your body with warm water from the neck down.  8. DO NOT shower/wash with your normal soap after using and rinsing off the CHG Soap.  9. Pat yourself dry with a CLEAN TOWEL.  10. Wear CLEAN PAJAMAS to bed the night before surgery, wear comfortable clothes the morning of surgery  11. Place CLEAN SHEETS on your bed the night of your first shower and DO NOT SLEEP WITH PETS.    Day of Surgery: Do not apply any deodorants/lotions. Please wear clean clothes to the hospital/surgery center.      Please read over the following fact sheets that you were given.

## 2018-01-08 ENCOUNTER — Inpatient Hospital Stay (HOSPITAL_COMMUNITY)
Admission: RE | Admit: 2018-01-08 | Discharge: 2018-01-08 | Disposition: A | Discharge status: Home / Self Care | Payer: Medicare HMO | Source: Ambulatory Visit

## 2018-01-13 ENCOUNTER — Encounter (HOSPITAL_COMMUNITY)
Admission: RE | Disposition: A | Discharge status: Home / Self Care | Payer: Self-pay | Source: Ambulatory Visit | Attending: Neurosurgery

## 2018-01-13 ENCOUNTER — Encounter (HOSPITAL_COMMUNITY): Payer: Self-pay | Admitting: *Deleted

## 2018-01-13 ENCOUNTER — Ambulatory Visit (HOSPITAL_COMMUNITY)
Admission: RE | Admit: 2018-01-13 | Discharge: 2018-01-13 | Disposition: A | Discharge status: Home / Self Care | Payer: Medicare HMO | Source: Ambulatory Visit | Attending: Neurosurgery | Admitting: Neurosurgery

## 2018-01-13 ENCOUNTER — Ambulatory Visit (HOSPITAL_COMMUNITY): Payer: Medicare HMO

## 2018-01-13 DIAGNOSIS — K219 Gastro-esophageal reflux disease without esophagitis: Secondary | ICD-10-CM | POA: Insufficient documentation

## 2018-01-13 DIAGNOSIS — Z79899 Other long term (current) drug therapy: Secondary | ICD-10-CM | POA: Insufficient documentation

## 2018-01-13 DIAGNOSIS — G5603 Carpal tunnel syndrome, bilateral upper limbs: Secondary | ICD-10-CM | POA: Insufficient documentation

## 2018-01-13 DIAGNOSIS — Z7984 Long term (current) use of oral hypoglycemic drugs: Secondary | ICD-10-CM | POA: Insufficient documentation

## 2018-01-13 DIAGNOSIS — E785 Hyperlipidemia, unspecified: Secondary | ICD-10-CM | POA: Diagnosis not present

## 2018-01-13 DIAGNOSIS — Z981 Arthrodesis status: Secondary | ICD-10-CM | POA: Insufficient documentation

## 2018-01-13 DIAGNOSIS — I10 Essential (primary) hypertension: Secondary | ICD-10-CM | POA: Insufficient documentation

## 2018-01-13 DIAGNOSIS — E119 Type 2 diabetes mellitus without complications: Secondary | ICD-10-CM | POA: Insufficient documentation

## 2018-01-13 DIAGNOSIS — M1990 Unspecified osteoarthritis, unspecified site: Secondary | ICD-10-CM | POA: Diagnosis not present

## 2018-01-13 HISTORY — PX: CARPAL TUNNEL RELEASE: SHX101

## 2018-01-13 LAB — GLUCOSE, CAPILLARY
GLUCOSE-CAPILLARY: 168 mg/dL — AB (ref 65–99)
GLUCOSE-CAPILLARY: 146 mg/dL — AB (ref 65–99)

## 2018-01-13 SURGERY — CARPAL TUNNEL RELEASE
Anesthesia: General | Site: Hand | Laterality: Right

## 2018-01-13 MED ORDER — PHENYLEPHRINE 40 MCG/ML (10ML) SYRINGE FOR IV PUSH (FOR BLOOD PRESSURE SUPPORT)
PREFILLED_SYRINGE | INTRAVENOUS | Status: AC
  Filled 2018-01-13: qty 10

## 2018-01-13 MED ORDER — CEFAZOLIN SODIUM-DEXTROSE 2-4 GM/100ML-% IV SOLN
2.0000 g | INTRAVENOUS | Status: AC
  Administered 2018-01-13: 2 g via INTRAVENOUS

## 2018-01-13 MED ORDER — LACTATED RINGERS IV SOLN
INTRAVENOUS | Status: DC
  Administered 2018-01-13: 10:00:00 via INTRAVENOUS

## 2018-01-13 MED ORDER — BACITRACIN ZINC 500 UNIT/GM EX OINT
TOPICAL_OINTMENT | CUTANEOUS | Status: AC
  Filled 2018-01-13: qty 28.35

## 2018-01-13 MED ORDER — MIDAZOLAM HCL 2 MG/2ML IJ SOLN
INTRAMUSCULAR | Status: AC
  Filled 2018-01-13: qty 2

## 2018-01-13 MED ORDER — FENTANYL CITRATE (PF) 250 MCG/5ML IJ SOLN
INTRAMUSCULAR | Status: AC
Start: 2018-01-13 — End: ?
  Filled 2018-01-13: qty 5

## 2018-01-13 MED ORDER — ARTIFICIAL TEARS OPHTHALMIC OINT
TOPICAL_OINTMENT | OPHTHALMIC | Status: AC
  Filled 2018-01-13: qty 3.5

## 2018-01-13 MED ORDER — OXYCODONE HCL 5 MG PO TABS: 5.0000 mg | ORAL_TABLET | Freq: Once | ORAL | Status: DC | PRN

## 2018-01-13 MED ORDER — EPHEDRINE SULFATE-NACL 50-0.9 MG/10ML-% IV SOSY
PREFILLED_SYRINGE | INTRAVENOUS | Status: DC | PRN
  Administered 2018-01-13 (×2): 5 mg via INTRAVENOUS

## 2018-01-13 MED ORDER — DEXAMETHASONE SODIUM PHOSPHATE 4 MG/ML IJ SOLN
INTRAMUSCULAR | Status: DC | PRN
  Administered 2018-01-13: 8 mg via INTRAVENOUS

## 2018-01-13 MED ORDER — ROCURONIUM BROMIDE 10 MG/ML (PF) SYRINGE
PREFILLED_SYRINGE | INTRAVENOUS | Status: AC
  Filled 2018-01-13: qty 5

## 2018-01-13 MED ORDER — MIDAZOLAM HCL 5 MG/5ML IJ SOLN
INTRAMUSCULAR | Status: DC | PRN
  Administered 2018-01-13: 2 mg via INTRAVENOUS

## 2018-01-13 MED ORDER — BUPIVACAINE-EPINEPHRINE (PF) 0.5% -1:200000 IJ SOLN
INTRAMUSCULAR | Status: AC
  Filled 2018-01-13: qty 30

## 2018-01-13 MED ORDER — ONDANSETRON HCL 4 MG/2ML IJ SOLN: 4.0000 mg | Freq: Once | INTRAMUSCULAR | Status: DC | PRN

## 2018-01-13 MED ORDER — ONDANSETRON HCL 4 MG/2ML IJ SOLN
INTRAMUSCULAR | Status: AC
  Filled 2018-01-13: qty 2

## 2018-01-13 MED ORDER — EPHEDRINE 5 MG/ML INJ
INTRAVENOUS | Status: AC
  Filled 2018-01-13: qty 10

## 2018-01-13 MED ORDER — PROPOFOL 10 MG/ML IV BOLUS
INTRAVENOUS | Status: AC
  Filled 2018-01-13: qty 20

## 2018-01-13 MED ORDER — FENTANYL CITRATE (PF) 100 MCG/2ML IJ SOLN
INTRAMUSCULAR | Status: DC | PRN
  Administered 2018-01-13 (×2): 50 ug via INTRAVENOUS
  Administered 2018-01-13 (×2): 25 ug via INTRAVENOUS

## 2018-01-13 MED ORDER — PHENYLEPHRINE 40 MCG/ML (10ML) SYRINGE FOR IV PUSH (FOR BLOOD PRESSURE SUPPORT)
PREFILLED_SYRINGE | INTRAVENOUS | Status: DC | PRN
  Administered 2018-01-13 (×5): 80 ug via INTRAVENOUS

## 2018-01-13 MED ORDER — PROPOFOL 10 MG/ML IV BOLUS
INTRAVENOUS | Status: DC | PRN
  Administered 2018-01-13: 160 mg via INTRAVENOUS
  Administered 2018-01-13: 40 mg via INTRAVENOUS

## 2018-01-13 MED ORDER — LIDOCAINE 2% (20 MG/ML) 5 ML SYRINGE
INTRAMUSCULAR | Status: DC | PRN
  Administered 2018-01-13: 40 mg via INTRAVENOUS

## 2018-01-13 MED ORDER — 0.9 % SODIUM CHLORIDE (POUR BTL) OPTIME
TOPICAL | Status: DC | PRN
  Administered 2018-01-13: 1000 mL

## 2018-01-13 MED ORDER — CEFAZOLIN SODIUM-DEXTROSE 2-4 GM/100ML-% IV SOLN
INTRAVENOUS | Status: AC
  Filled 2018-01-13: qty 100

## 2018-01-13 MED ORDER — OXYCODONE HCL 5 MG/5ML PO SOLN: 5.0000 mg | Freq: Once | ORAL | Status: DC | PRN

## 2018-01-13 MED ORDER — BACITRACIN ZINC 500 UNIT/GM EX OINT
TOPICAL_OINTMENT | CUTANEOUS | Status: DC | PRN
  Administered 2018-01-13: 1 via TOPICAL

## 2018-01-13 MED ORDER — LIDOCAINE 2% (20 MG/ML) 5 ML SYRINGE
INTRAMUSCULAR | Status: AC
  Filled 2018-01-13: qty 5

## 2018-01-13 MED ORDER — BUPIVACAINE-EPINEPHRINE 0.5% -1:200000 IJ SOLN
INTRAMUSCULAR | Status: DC | PRN
  Administered 2018-01-13: 5 mL

## 2018-01-13 MED ORDER — DEXAMETHASONE SODIUM PHOSPHATE 10 MG/ML IJ SOLN
INTRAMUSCULAR | Status: AC
  Filled 2018-01-13: qty 1

## 2018-01-13 MED ORDER — FENTANYL CITRATE (PF) 250 MCG/5ML IJ SOLN
INTRAMUSCULAR | Status: AC
  Filled 2018-01-13: qty 5

## 2018-01-13 MED ORDER — SUGAMMADEX SODIUM 200 MG/2ML IV SOLN
INTRAVENOUS | Status: AC
  Filled 2018-01-13: qty 2

## 2018-01-13 MED ORDER — LACTATED RINGERS IV SOLN
INTRAVENOUS | Status: DC | PRN
  Administered 2018-01-13: 10:00:00 via INTRAVENOUS

## 2018-01-13 MED ORDER — FENTANYL CITRATE (PF) 100 MCG/2ML IJ SOLN: 25.0000 ug | INTRAMUSCULAR | Status: DC | PRN

## 2018-01-13 MED ORDER — CHLORHEXIDINE GLUCONATE CLOTH 2 % EX PADS: 6.0000 | MEDICATED_PAD | Freq: Once | CUTANEOUS | Status: DC

## 2018-01-13 MED ORDER — ONDANSETRON HCL 4 MG/2ML IJ SOLN
INTRAMUSCULAR | Status: DC | PRN
  Administered 2018-01-13: 4 mg via INTRAVENOUS

## 2018-01-13 MED ORDER — GLYCOPYRROLATE 0.2 MG/ML IV SOSY
PREFILLED_SYRINGE | INTRAVENOUS | Status: DC | PRN
  Administered 2018-01-13: .1 mg via INTRAVENOUS

## 2018-01-13 SURGICAL SUPPLY — 47 items
BAG DECANTER FOR FLEXI CONT (MISCELLANEOUS) ×3 IMPLANT
BANDAGE ACE 4X5 VEL STRL LF (GAUZE/BANDAGES/DRESSINGS) ×3 IMPLANT
BANDAGE GAUZE 4  KLING STR (GAUZE/BANDAGES/DRESSINGS) IMPLANT
BLADE SURG 15 STRL LF DISP TIS (BLADE) ×2 IMPLANT
BLADE SURG 15 STRL SS (BLADE) ×4
BNDG STRETCH 4X75 NS LF (GAUZE/BANDAGES/DRESSINGS) ×3 IMPLANT
CABLE BIPOLOR RESECTION CORD (MISCELLANEOUS) ×3 IMPLANT
CARTRIDGE OIL MAESTRO DRILL (MISCELLANEOUS) IMPLANT
DIFFUSER DRILL AIR PNEUMATIC (MISCELLANEOUS) IMPLANT
DRAPE EXTREMITY T 121X128X90 (DRAPE) ×3 IMPLANT
DRAPE HALF SHEET 40X57 (DRAPES) ×9 IMPLANT
GAUZE SPONGE 4X4 12PLY STRL (GAUZE/BANDAGES/DRESSINGS) IMPLANT
GAUZE SPONGE 4X4 12PLY STRL LF (GAUZE/BANDAGES/DRESSINGS) ×3 IMPLANT
GAUZE SPONGE 4X4 16PLY XRAY LF (GAUZE/BANDAGES/DRESSINGS) ×3 IMPLANT
GLOVE BIO SURGEON STRL SZ8 (GLOVE) ×3 IMPLANT
GLOVE BIO SURGEON STRL SZ8.5 (GLOVE) ×3 IMPLANT
GLOVE BIOGEL PI IND STRL 6.5 (GLOVE) ×2 IMPLANT
GLOVE BIOGEL PI IND STRL 7.5 (GLOVE) ×1 IMPLANT
GLOVE BIOGEL PI INDICATOR 6.5 (GLOVE) ×4
GLOVE BIOGEL PI INDICATOR 7.5 (GLOVE) ×2
GLOVE EXAM NITRILE LRG STRL (GLOVE) IMPLANT
GLOVE EXAM NITRILE XL STR (GLOVE) IMPLANT
GLOVE EXAM NITRILE XS STR PU (GLOVE) IMPLANT
GLOVE SURG SS PI 6.0 STRL IVOR (GLOVE) ×12 IMPLANT
GLOVE SURG SS PI 7.0 STRL IVOR (GLOVE) ×12 IMPLANT
GOWN STRL REUS W/ TWL LRG LVL3 (GOWN DISPOSABLE) IMPLANT
GOWN STRL REUS W/ TWL XL LVL3 (GOWN DISPOSABLE) ×2 IMPLANT
GOWN STRL REUS W/TWL LRG LVL3 (GOWN DISPOSABLE)
GOWN STRL REUS W/TWL XL LVL3 (GOWN DISPOSABLE) ×4
KIT BASIN OR (CUSTOM PROCEDURE TRAY) ×3 IMPLANT
KIT ROOM TURNOVER OR (KITS) ×3 IMPLANT
MARKER SKIN DUAL TIP RULER LAB (MISCELLANEOUS) ×3 IMPLANT
NEEDLE HYPO 25X1 1.5 SAFETY (NEEDLE) ×3 IMPLANT
NS IRRIG 1000ML POUR BTL (IV SOLUTION) ×3 IMPLANT
OIL CARTRIDGE MAESTRO DRILL (MISCELLANEOUS)
PACK SURGICAL SETUP 50X90 (CUSTOM PROCEDURE TRAY) ×3 IMPLANT
PAD ARMBOARD 7.5X6 YLW CONV (MISCELLANEOUS) ×9 IMPLANT
STOCKINETTE 4X48 STRL (DRAPES) ×3 IMPLANT
SUT ETHILON 3 0 PS 1 (SUTURE) ×3 IMPLANT
SUT VIC AB 3-0 SH 8-18 (SUTURE) ×3 IMPLANT
SYR BULB 3OZ (MISCELLANEOUS) ×3 IMPLANT
SYR CONTROL 10ML LL (SYRINGE) ×3 IMPLANT
TOWEL GREEN STERILE FF (TOWEL DISPOSABLE) ×9 IMPLANT
TUBE CONNECTING 12'X1/4 (SUCTIONS) ×1
TUBE CONNECTING 12X1/4 (SUCTIONS) ×2 IMPLANT
UNDERPAD 30X30 (UNDERPADS AND DIAPERS) ×3 IMPLANT
WATER STERILE IRR 1000ML POUR (IV SOLUTION) ×3 IMPLANT

## 2018-01-13 NOTE — H&P (Signed)
Subjective: The patient is a 57 year old black male who presents with a complicated history.  He had diffuse neurologic symptoms.  He was thought that he might have ALS but this was ruled out.  He was diagnosed with a cervical myelopathy and has had a 4 level anterior cervical discectomy, fusion and plating about a year ago.  He has had some persistent hand numbness and weakness.  He was worked up further with NCV EMGs which demonstrated bilateral carpal tunnel syndrome.  I discussed the situation with the patient.  We discussed the various treatment options.  He has decided to proceed with a right carpal tunnel release.  Past Medical History:  Diagnosis Date  . Arthritis    cervical spondylosis, myelopathy   . Diabetes mellitus without complication ([place])    controlled  . GERD (gastroesophageal reflux disease)   . Hyperlipidemia   . Hypertension    controlled  . Lazy eye, left     Past Surgical History:  Procedure Laterality Date  . ANTERIOR CERVICAL DECOMPRESSION/DISCECTOMY FUSION 4 LEVELS N/A 01/21/2017   Procedure: ANTERIOR CERVICAL DECOMPRESSION/DISCECTOMY FUSION , INTERBODY PROSTHESIS,PLATE CERVICAL THREE- CERVICAL FOUR,CERVICAL FOUR- CERVICAL FIVE, CERVICAL FIVE- CERVICAL SIX, CERVICAL SIX- CERVICAL SEVEN;  Surgeon: Newman Pies, MD;  Location: [place];  Service: Neurosurgery;  Laterality: N/A;  . COLONOSCOPY WITH PROPOFOL N/A 08/30/2015   Procedure: COLONOSCOPY WITH PROPOFOL;  Surgeon: Lucilla Lame, MD;  Location: [facility];  Service: Endoscopy;  Laterality: N/A;    Allergies  Allergen Reactions  . No Known Allergies     Social History   Tobacco Use  . Smoking status: Never Smoker  . Smokeless tobacco: Never Used  Substance Use Topics  . Alcohol use: No    Alcohol/week: 0.0 oz    History reviewed. No pertinent family history. Prior to Admission medications   Medication Sig Start Date End Date Taking? Authorizing Provider  atorvastatin (LIPITOR) 40 MG tablet TAKE 1  TABLET EVERY DAY Patient taking differently: TAKE 1 TABLET (40 MG)EVERY DAY AT BEDTIME 10/24/17  Yes Chauvin, Herbie [place], PA  cyclobenzaprine (FLEXERIL) 10 MG tablet TAKE 1 TABLET THREE TIMES DAILY AS NEEDED FOR MUSCLE SPASMS 10/24/17  Yes Carmon Ginsberg, PA  docusate sodium (COLACE) 100 MG capsule Take 1 capsule (100 mg total) by mouth 2 (two) times daily. Patient taking differently: Take 100 mg by mouth 2 (two) times daily as needed for mild constipation.  01/22/17  Yes Newman Pies, MD  glipiZIDE (GLUCOTROL XL) 10 MG 24 hr tablet TAKE 2 TABLETS EVERY DAY Patient taking differently: TAKE 1 TABLET (10 MG) BY MOUTH TWICE DAILY 11/30/17  Yes Carmon Ginsberg, PA  hydrochlorothiazide (HYDRODIURIL) 25 MG tablet TAKE 1 TABLET (25 MG TOTAL) BY MOUTH DAILY. Patient taking differently: Take 25 mg by mouth every evening.  10/24/17  Yes Carmon Ginsberg, PA  lisinopril (PRINIVIL,ZESTRIL) 20 MG tablet TAKE 1 TABLET (20 MG TOTAL) BY MOUTH DAILY. Patient taking differently: Take 20 mg by mouth 2 (two) times daily.  10/24/17  Yes Carmon Ginsberg, PA  metFORMIN (GLUCOPHAGE) 850 MG tablet TAKE 1 TABLET THREE TIMES DAILY WITH MEALS Patient taking differently: Take 850 mg by mouth 3 (three) times daily.  10/24/17  Yes Carmon Ginsberg, PA  Multiple Vitamin (MULTIVITAMIN WITH MINERALS) TABS tablet Take 1 tablet by mouth daily.   Yes [provider]  Omega-3 Fatty Acids (OMEGA 3 500 PO) Take 500 mg by mouth daily.   Yes [provider]  omeprazole (PRILOSEC) 40 MG capsule TAKE 1 CAPSULE (40 MG  TOTAL) BY MOUTH DAILY. Patient taking differently: Take 40 mg by mouth daily as needed (for heartburn/indigestion.).  10/24/17  Yes Chauvin, Herbie [place], PA  oxyCODONE-acetaminophen (PERCOCET/ROXICET) 5-325 MG tablet TAKE 1-2 TABLET BY ORAL ROUTE EVERY 6 HOURS AS NEEDED FOR PAIN. 02/12/17  Yes [provider]  [place] test strip CHECK  SUGAR  DAILY 11/30/17   Carmon Ginsberg, PA  ACCU-CHEK [facility]  LANCETS lancets CHECK  SUGAR  DAILY 10/24/17   Carmon Ginsberg, PA  Alcohol Swabs (B-D SINGLE USE SWABS REGULAR) PADS CHECK  SUGAR  DAILY 10/24/17   Carmon Ginsberg, PA  Blood Glucose Monitoring Suppl (ACCU-CHEK AVIVA PLUS) w/Device KIT CHECK  SUGAR  DAILY 11/30/17   Carmon Ginsberg, PA  meloxicam (MOBIC) 15 MG tablet TAKE 1 TABLET EVERY DAY WITH FOOD Patient not taking: Reported on 12/30/2017 11/30/17   Carmon Ginsberg, PA  tadalafil (CIALIS) 20 MG tablet Take 20 mg by mouth daily as needed for erectile dysfunction. Reported on 04/23/2016 12/23/13   [provider]     Review of Systems  Positive ROS: As above  All other systems have been reviewed and were otherwise negative with the exception of those mentioned in the HPI and as above.  Objective: Vital signs in last 24 hours: Temp:  [98.3 F (36.8 C)] 98.3 F (36.8 C) (02/25 1000) Pulse Rate:  [100] 100 (02/25 1000) Resp:  [18] 18 (02/25 1000) BP: (144)/(96) 144/96 (02/25 1000) SpO2:  [99 %] 99 % (02/25 1000) Weight:  [94.3 kg (208 lb)] 94.3 kg (208 lb) (02/25 1000) Estimated body mass index is 31.63 kg/m as calculated from the following:   Height as of this encounter: '5\' 8"'  (1.727 m).   Weight as of this encounter: 94.3 kg (208 lb).   General Appearance: Alert Head: Normocephalic, without obvious abnormality, atraumatic Eyes: PERRL, conjunctiva/corneas clear, EOM's intact,    Ears: Normal  Throat: Normal  Neck: Supple, Back: unremarkable Lungs: Clear to auscultation bilaterally, respirations unlabored Heart: Regular rate and rhythm, no murmur, rub or gallop Abdomen: Soft, non-tender Extremities: Extremities normal, atraumatic, no cyanosis or edema Skin: unremarkable  NEUROLOGIC:   Mental status: alert and oriented,Motor Exam -he has weakness in the shoulders and hands bilaterally. Sensory Exam -he has numbness in his hands. Reflexes:  Coordination - grossly normal Gait - grossly normal Balance - grossly  normal Cranial Nerves: I: smell Not tested  II: visual acuity  OS: Normal  OD: Normal   II: visual fields Full to confrontation  II: pupils Equal, round, reactive to light  III,VII: ptosis None  III,IV,VI: extraocular muscles  Full ROM  V: mastication Normal  V: facial light touch sensation  Normal  V,VII: corneal reflex  Present  VII: facial muscle function - upper  Normal  VII: facial muscle function - lower Normal  VIII: hearing Not tested  IX: soft palate elevation  Normal  IX,X: gag reflex Present  XI: trapezius strength  5/5  XI: sternocleidomastoid strength 5/5  XI: neck flexion strength  5/5  XII: tongue strength  Normal    Data Review Lab Results  Component Value Date   WBC 7.7 01/07/2018   HGB 14.3 01/07/2018   HCT 41.2 01/07/2018   MCV 86.7 01/07/2018   PLT 254 01/07/2018   Lab Results  Component Value Date   NA 138 01/07/2018   K 4.3 01/07/2018   CL 105 01/07/2018   CO2 21 (L) 01/07/2018   BUN 16 01/07/2018   CREATININE 0.93 01/07/2018  GLUCOSE 204 (H) 01/07/2018   No results found for: INR, PROTIME  Assessment/Plan: Bilateral carpal tunnel syndrome: I have discussed the situation with the patient and his wife.  We have discussed the various treatment options including surgery.  I have described the surgical treatment option of a right carpal tunnel release.  I have given him a surgical pamphlet.  We have discussed the risks, benefits, alternatives, expected postoperative course, and likelihood of achieving our goals with surgery.  I have answered all his questions.  He has decided to proceed with a right carpal tunnel release.   Ophelia Charter 01/13/2018 11:16 AM

## 2018-01-13 NOTE — Anesthesia Procedure Notes (Signed)
Procedure Name: LMA Insertion Date/Time: 01/13/2018 11:30 AM Performed by: Julian ReilWelty, Nakota Elsen F, CRNA Pre-anesthesia Checklist: Patient identified, Emergency Drugs available, Suction available, Patient being monitored and Timeout performed Patient Re-evaluated:Patient Re-evaluated prior to induction Oxygen Delivery Method: Circle system utilized Preoxygenation: Pre-oxygenation with 100% oxygen Induction Type: IV induction Ventilation: Mask ventilation without difficulty LMA: LMA inserted LMA Size: 4.0 Tube type: Oral Number of attempts: 1 Placement Confirmation: positive ETCO2 and breath sounds checked- equal and bilateral Tube secured with: Tape Dental Injury: Teeth and Oropharynx as per pre-operative assessment

## 2018-01-13 NOTE — Op Note (Signed)
Brief history: The patient is a 57 year old black male who has complained of hand numbness.  He was worked up with NCV EMGs was diagnosed with bilateral carpal tunnel syndrome.  I discussed the situation with the patient.  We discussed the various treatment options including surgery.  He has decided proceed with a right carpal tunnel release after weighing the risks, benefits and alternatives to surgery.  Preoperative diagnosis: Bilateral carpal tunnel syndrome  Postoperative diagnosis: The same  Procedure: Right carpal tunnel release  Surgeon: Dr. Earle Gell  Asst.: None  Anesthesia: LMA  Estimated blood loss: Minimal  Drains: None  Complications: None  Description of procedure: The patient was brought to the operating room by the anesthesia team. MAC anesthesia was induced. The patient remained in the supine position. The patient's right upper extremity was prepared with Betadine scrub and Betadine solution. Sterile drapes were applied.  I then injected the area to be incised with Marcaine solution. I then used a scalpel to make an incision in the palmar crease. I then dissected with the scalpel deeper and incised the superficial fascia. We inserted the Weitlanter retractor for exposure. We then identified the transverse carpal ligament. I carefully incised with a 15 blade scalpel. We then identified the median nerve. I used the Metzenbaum scissors to divide the  transverse carpal ligament both proximally and distally staying along the ulnar aspect of the median nerve. We got a good decompression of the nerve.  I then obtained hemostasis using bipolar electrocautery. We then irrigated the wound out with bacitracin solution. I then removed the retractor. We then reapproximated patient's subcutaneous tissue with interrupted 3-0 Vicryl suture. The skin was reapproximated with a running 3-0 nylon suture. The wound was then covered with bacitracin ointment. A sterile dressing was applied. The  drapes were removed. Patient was subsequently transported to the post anesthesia care unit in stable condition. All sponge instrument and needle counts were reportedly correct at the end of this case.

## 2018-01-13 NOTE — Anesthesia Postprocedure Evaluation (Signed)
Anesthesia Post Note  Patient: Warden FillersRicky C Tudisco  Procedure(s) Performed: CARPAL TUNNEL RELEASE RIGHT (Right Hand)     Patient location during evaluation: PACU Anesthesia Type: General Level of consciousness: awake and alert Pain management: pain level controlled Vital Signs Assessment: post-procedure vital signs reviewed and stable Respiratory status: spontaneous breathing, nonlabored ventilation, respiratory function stable and patient connected to nasal cannula oxygen Cardiovascular status: blood pressure returned to baseline and stable Postop Assessment: no apparent nausea or vomiting Anesthetic complications: no    Last Vitals:  Vitals:   01/13/18 1232 01/13/18 1245  BP:  109/86  Pulse:  89  Resp:  14  Temp: 36.5 C   SpO2:  95%    Last Pain:  Vitals:   01/13/18 1255  TempSrc:   PainSc: 0-No pain                 Tiamarie Furnari COKER

## 2018-01-13 NOTE — Anesthesia Preprocedure Evaluation (Addendum)
Anesthesia Evaluation  Patient identified by MRN, date of birth, ID band Patient awake    Reviewed: Allergy & Precautions, NPO status , Patient's Chart, lab work & pertinent test results  Airway Mallampati: II  TM Distance: >3 FB Neck ROM: Full    Dental  (+) Dental Advisory Given, Teeth Intact Upper bridge, no loose teeth.:   Pulmonary neg pulmonary ROS,    breath sounds clear to auscultation       Cardiovascular hypertension, Pt. on medications  Rhythm:Regular Rate:Normal     Neuro/Psych negative neurological ROS  negative psych ROS   GI/Hepatic Neg liver ROS, GERD  Medicated and Controlled,  Endo/Other  diabetes, Type 2, Oral Hypoglycemic AgentsObesity  Renal/GU negative Renal ROS  negative genitourinary   Musculoskeletal  (+) Arthritis , Osteoarthritis,    Abdominal   Peds negative pediatric ROS (+)  Hematology negative hematology ROS (+)   Anesthesia Other Findings - HLD  Reproductive/Obstetrics negative OB ROS                            Lab Results  Component Value Date   WBC 7.7 01/07/2018   HGB 14.3 01/07/2018   HCT 41.2 01/07/2018   MCV 86.7 01/07/2018   PLT 254 01/07/2018   Lab Results  Component Value Date   CREATININE 0.93 01/07/2018   BUN 16 01/07/2018   NA 138 01/07/2018   K 4.3 01/07/2018   CL 105 01/07/2018   CO2 21 (L) 01/07/2018   No results found for: INR, PROTIME  EKG: NSR  Anesthesia Physical  Anesthesia Plan  ASA: III  Anesthesia Plan: General   Post-op Pain Management:    Induction: Intravenous  PONV Risk Score and Plan: Propofol infusion, Treatment may vary due to age or medical condition and Ondansetron  Airway Management Planned: LMA  Additional Equipment: None  Intra-op Plan:   Post-operative Plan:   Informed Consent: I have reviewed the patients History and Physical, chart, labs and discussed the procedure including the risks,  benefits and alternatives for the proposed anesthesia with the patient or authorized representative who has indicated his/her understanding and acceptance.   Dental advisory given  Plan Discussed with: CRNA and Anesthesiologist  Anesthesia Plan Comments:        Anesthesia Quick Evaluation

## 2018-01-13 NOTE — Transfer of Care (Signed)
Immediate Anesthesia Transfer of Care Note  Patient: Connor [name]  Procedure(s) Performed: CARPAL TUNNEL RELEASE RIGHT (Right Hand)  Patient Location: PACU  Anesthesia Type:General  Level of Consciousness: unresponsive and drowsy  Airway & Oxygen Therapy: Patient Spontanous Breathing and Patient connected to face mask oxygen  Post-op Assessment: Report given to RN and Post -op Vital signs reviewed and stable  Post vital signs: Reviewed and stable  Last Vitals:  Vitals:   01/13/18 1000  BP: (!) 144/96  Pulse: 100  Resp: 18  Temp: 36.8 C  SpO2: 99%    Last Pain:  Vitals:   01/13/18 1000  TempSrc: Oral      Patients Stated Pain Goal: 3 (01/13/18 1009)  Complications: No apparent anesthesia complications

## 2018-01-14 ENCOUNTER — Encounter (HOSPITAL_COMMUNITY): Payer: Self-pay | Admitting: Neurosurgery

## 2018-01-26 IMAGING — CR DG CERVICAL SPINE 2 OR 3 VIEWS
1 series · 1 of 1 positions shown · non-contrast
Comparison: None.

CLINICAL DATA: C3-C7 ACDF

EXAM:
CERVICAL SPINE - 2-3 VIEW

[AP]
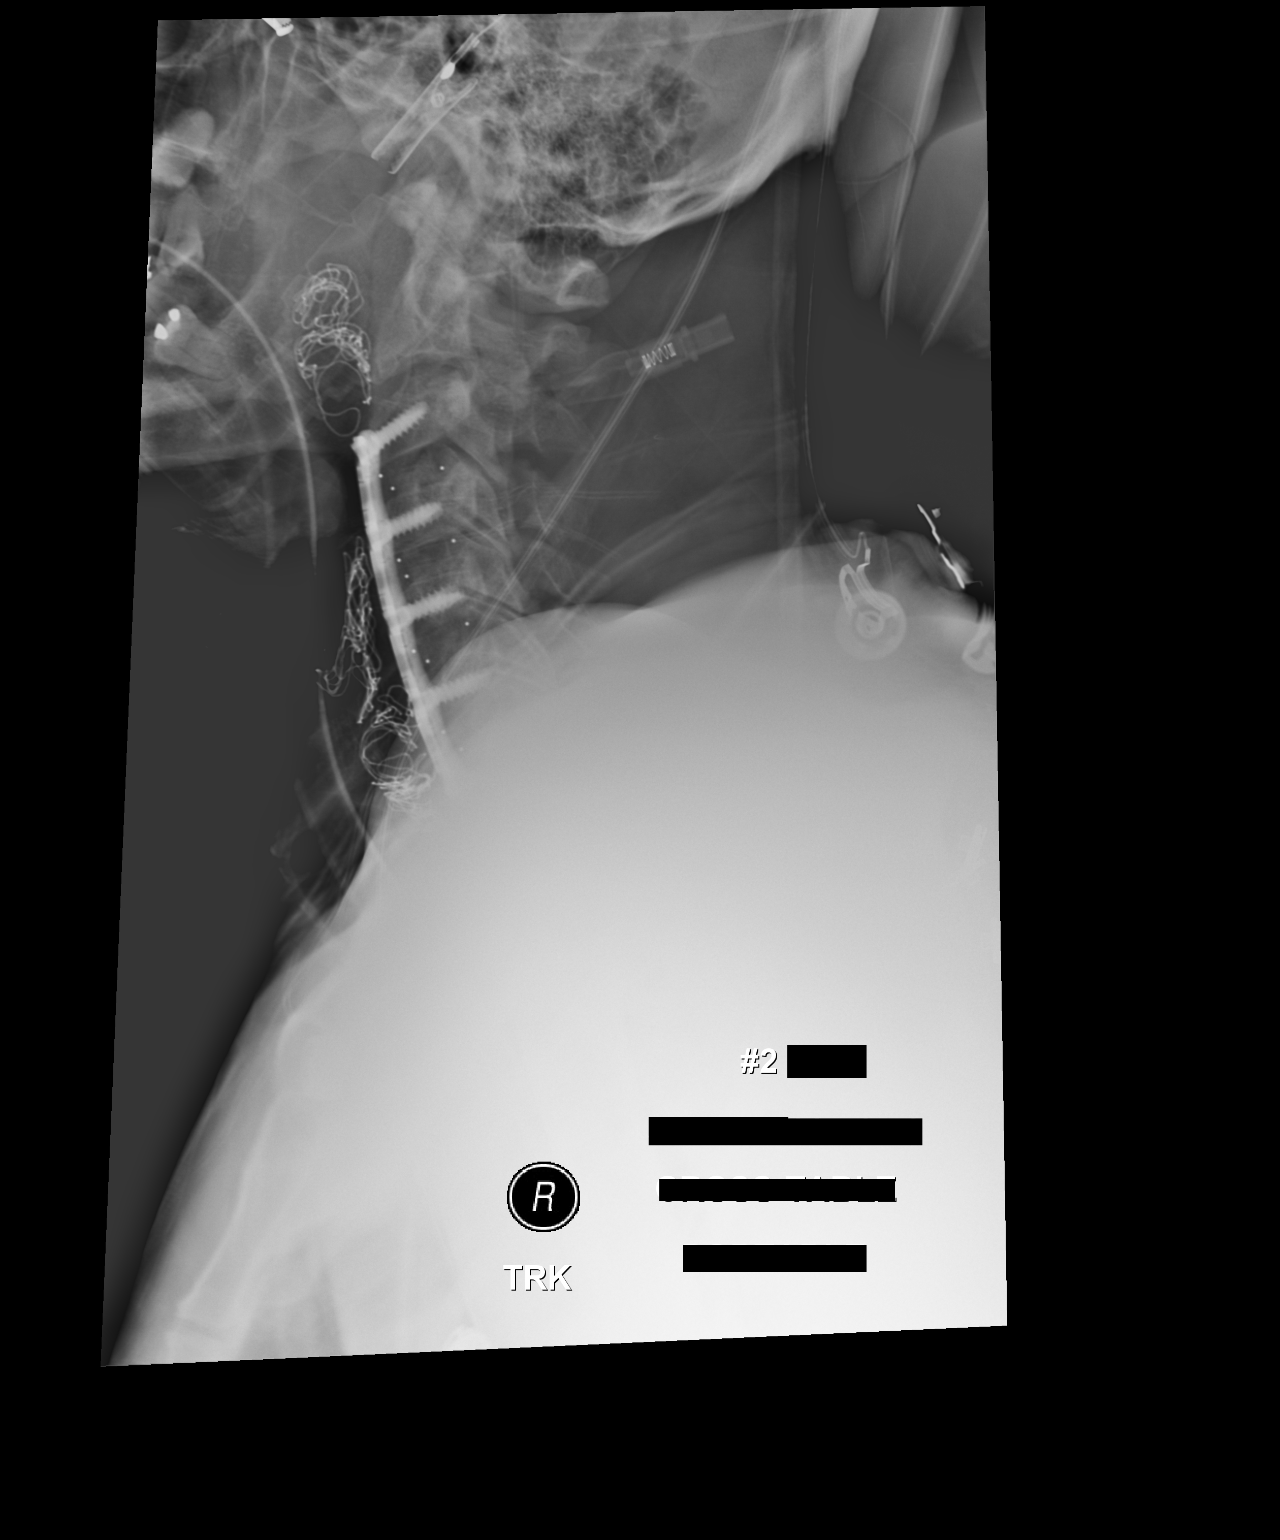

[1 of 1 positions shown; findings below may reference images not displayed]

FINDINGS: First lateral intraoperative image demonstrates anterior localizing
instrument at C4-5

Second lateral intraoperative image demonstrates anterior plate and
fusion changes from C3-C7. The C7 vertebral body cannot be
visualized due to overlying shoulders. Alignment otherwise normal.
No visible hardware complicating feature.
IMPRESSION: C3-C7 ACDF. The lower cervical spine cannot be visualized due to
overlying shoulders. No visible complicating feature.

## 2018-01-26 IMAGING — DX DG CERVICAL SPINE 2 OR 3 VIEWS
1 series · 1 of 1 positions shown · non-contrast
Comparison: None.

CLINICAL DATA: C3-C7 ACDF

EXAM:
CERVICAL SPINE - 2-3 VIEW

[c-spine lat]
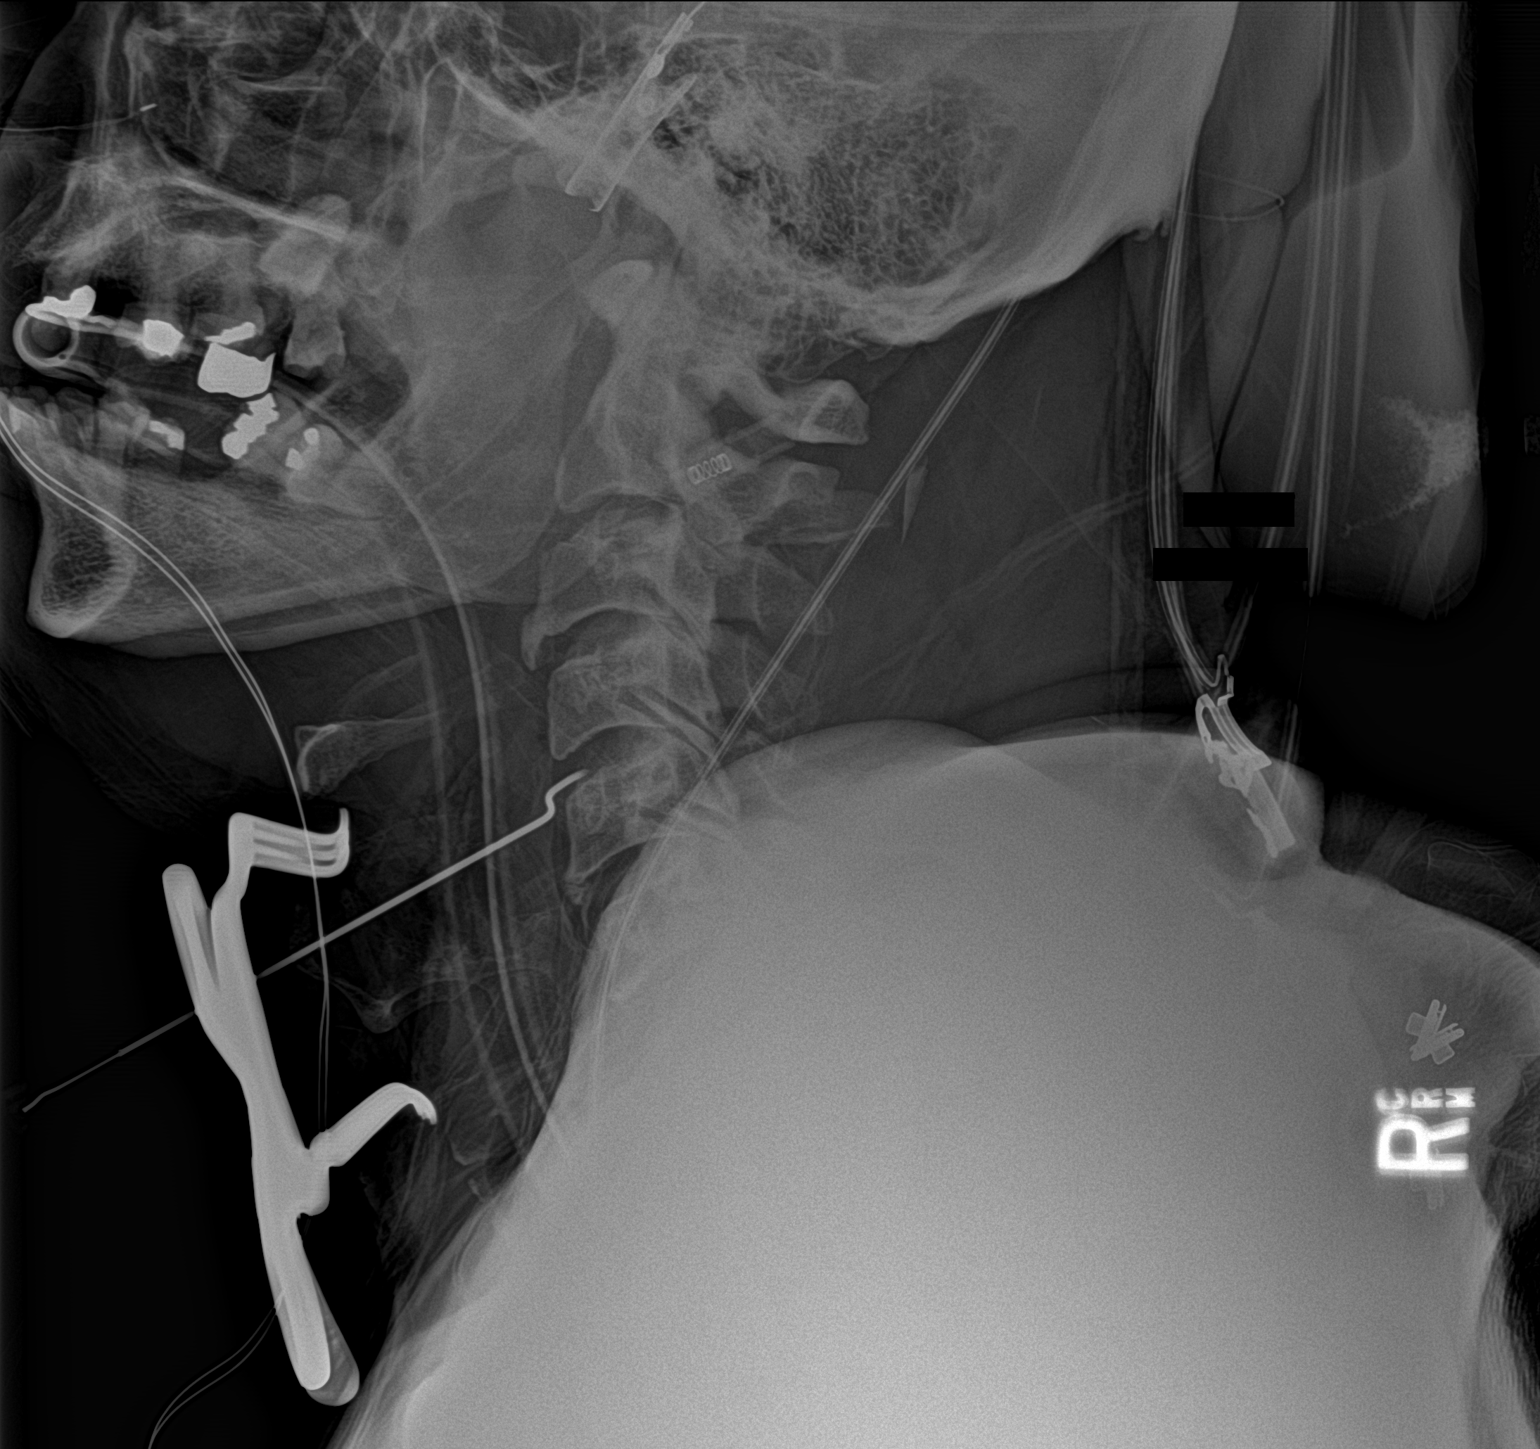

[1 of 1 positions shown; findings below may reference images not displayed]

FINDINGS: First lateral intraoperative image demonstrates anterior localizing
instrument at C4-5

Second lateral intraoperative image demonstrates anterior plate and
fusion changes from C3-C7. The C7 vertebral body cannot be
visualized due to overlying shoulders. Alignment otherwise normal.
No visible hardware complicating feature.
IMPRESSION: C3-C7 ACDF. The lower cervical spine cannot be visualized due to
overlying shoulders. No visible complicating feature.

## 2018-02-05 ENCOUNTER — Other Ambulatory Visit: Payer: Self-pay | Admitting: Family Medicine

## 2018-02-05 DIAGNOSIS — E119 Type 2 diabetes mellitus without complications: Secondary | ICD-10-CM

## 2018-02-11 ENCOUNTER — Ambulatory Visit (INDEPENDENT_AMBULATORY_CARE_PROVIDER_SITE_OTHER): Payer: Medicare HMO | Admitting: Family Medicine

## 2018-02-11 ENCOUNTER — Encounter: Payer: Self-pay | Admitting: Family Medicine

## 2018-02-11 VITALS — BP 130/90 | HR 86 | Temp 97.6°F | Resp 15 | Wt 210.2 lb

## 2018-02-11 DIAGNOSIS — L0292 Furuncle, unspecified: Secondary | ICD-10-CM

## 2018-02-11 MED ORDER — DOXYCYCLINE HYCLATE 100 MG PO TABS: 100.0000 mg | ORAL_TABLET | Freq: Two times a day (BID) | ORAL | 0 refills | Status: DC

## 2018-02-11 NOTE — Patient Instructions (Signed)
Cleanse wounds with soap and water and apply a bandaid/dressing. We will call you with the culture result. Do make an appointment as soon as possible with Dr. Lovell SheehanJenkins about your wound opening up and the pussy drainage.

## 2018-02-11 NOTE — Progress Notes (Signed)
Subjective:     Patient ID: Connor [name], male   DOB: 12/20/1960, 57 y.o.   MRN: [id number] Chief Complaint  Patient presents with  . Recurrent Skin Infections    Patient comes in office today with concerns of boils located on his right arm and right buttock cheek for the past two weeks. Patient states that both boils has busted on 02/07/18. Patient states that boil on arm looks like it has been filled back up and reports that boil on buttock had drained dark brown/black fluid.     HPI Remote hx of MRSA. Had carpal tunnel release on 2/25 with stitches removed 8 days later per his report. Wound has now dehisced with purulent discharge. He has been applying hydrogen peroxide to his boils and wound. MRSA screen prior to surgery was negatie.  Review of Systems     Objective:   Physical Exam  Constitutional: He appears well-developed and well-nourished. No distress.  Skin:  Right buttock with minimally tender furuncle with purulent drainage-C & S obtained Right elbow with eschar and mild induration and tenderness-no drainage Right anterior wrist with open wound and pussy drainage.        Assessment:    1. Furuncles - doxycycline (VIBRA-TABS) 100 MG tablet; Take 1 tablet (100 mg total) by mouth 2 (two) times daily.  Dispense: 20 tablet; Refill: 0 - WOUND CULTURE    Plan:    Return to his surgeon, Dr. Lovell SheehanJenkins ASAP. Cleanse wounds with soap and water pending culture report.

## 2018-02-11 NOTE — Addendum Note (Signed)
Addended by: Jaclyn PrimeHAUVIN, Ezell Poke S on: 02/11/2018 04:18 PM   Modules accepted: Orders

## 2018-02-14 ENCOUNTER — Telehealth: Payer: Self-pay

## 2018-02-14 LAB — AEROBIC CULTURE

## 2018-02-14 NOTE — Telephone Encounter (Signed)
-----   Message from Anola Gurneyobert Chauvin, [place] sent at 02/14/2018  7:50 AM EDT ----- You do have a staphylococcus infection which should respond to the doxycycline you are on. Are you improving?

## 2018-02-14 NOTE — Telephone Encounter (Signed)
Tried calling patient. Left message to call back. 

## 2018-02-14 NOTE — Telephone Encounter (Signed)
Patient advised of results and states that he is improving and the area has dried up.

## 2018-02-21 ENCOUNTER — Ambulatory Visit (INDEPENDENT_AMBULATORY_CARE_PROVIDER_SITE_OTHER): Payer: Medicare HMO | Admitting: Family Medicine

## 2018-02-21 ENCOUNTER — Encounter: Payer: Self-pay | Admitting: Family Medicine

## 2018-02-21 VITALS — BP 134/90 | HR 80 | Temp 97.8°F | Resp 16 | Wt 207.8 lb

## 2018-02-21 DIAGNOSIS — R9431 Abnormal electrocardiogram [ECG] [EKG]: Secondary | ICD-10-CM

## 2018-02-21 NOTE — Patient Instructions (Signed)
F/u diabetes the end of April.

## 2018-02-21 NOTE — Progress Notes (Signed)
Here with a mildly abnormal EKG he obtained in preparation for a clinical trial he did not participate in. EKG was read as first degree heart block and left anterior hemi-block. Appears unchanged from a pre-op EKG in February. Will f/u for regulr diabetic followup at the end of April.
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
## 2018-03-10 ENCOUNTER — Other Ambulatory Visit: Payer: Self-pay | Admitting: Family Medicine

## 2018-03-10 DIAGNOSIS — E1169 Type 2 diabetes mellitus with other specified complication: Secondary | ICD-10-CM

## 2018-03-10 DIAGNOSIS — E119 Type 2 diabetes mellitus without complications: Secondary | ICD-10-CM

## 2018-03-10 DIAGNOSIS — E785 Hyperlipidemia, unspecified: Secondary | ICD-10-CM

## 2018-03-18 ENCOUNTER — Other Ambulatory Visit: Payer: Self-pay | Admitting: Family Medicine

## 2018-03-18 ENCOUNTER — Encounter: Payer: Self-pay | Admitting: Family Medicine

## 2018-03-18 ENCOUNTER — Ambulatory Visit (INDEPENDENT_AMBULATORY_CARE_PROVIDER_SITE_OTHER): Payer: Medicare HMO | Admitting: Family Medicine

## 2018-03-18 VITALS — BP 128/86 | HR 105 | Temp 97.4°F | Resp 18 | Ht 72.0 in | Wt 208.0 lb

## 2018-03-18 DIAGNOSIS — Z1159 Encounter for screening for other viral diseases: Secondary | ICD-10-CM

## 2018-03-18 DIAGNOSIS — E119 Type 2 diabetes mellitus without complications: Secondary | ICD-10-CM | POA: Diagnosis not present

## 2018-03-18 DIAGNOSIS — E782 Mixed hyperlipidemia: Secondary | ICD-10-CM

## 2018-03-18 DIAGNOSIS — Z125 Encounter for screening for malignant neoplasm of prostate: Secondary | ICD-10-CM

## 2018-03-18 DIAGNOSIS — I10 Essential (primary) hypertension: Secondary | ICD-10-CM | POA: Diagnosis not present

## 2018-03-18 LAB — POCT GLYCOSYLATED HEMOGLOBIN (HGB A1C): Hemoglobin A1C: 9

## 2018-03-18 MED ORDER — METFORMIN HCL 1000 MG PO TABS: 1000.0000 mg | ORAL_TABLET | Freq: Two times a day (BID) | ORAL | 3 refills | Status: DC

## 2018-03-18 NOTE — Progress Notes (Signed)
  Subjective:     Patient ID: Connor [name], male   DOB: 1960-12-04, 57 y.o.   MRN: [id number] Chief Complaint  Patient presents with  . Follow-up  . Diabetes   HPI States he feels well. Agrees to update labs. He is trying to work in his yard more often and exercise but this was impacted by recent CTS surgery and permanent upper extremity weakness due to cervical myelopathy. Reports compliance with medication but is taking metformin after a meal.  Review of Systems  Respiratory: Negative for shortness of breath.   Cardiovascular: Negative for chest pain and palpitations.       Objective:   Physical Exam  Constitutional: He appears well-developed and well-nourished. No distress.  Cardiovascular: Normal rate and regular rhythm.  Pulmonary/Chest: Breath sounds normal.  Musculoskeletal: He exhibits no edema (of lower extremities).       Assessment:    1. Controlled type 2 diabetes mellitus without complication, without long-term current use of insulin (HCC): increase Metformin to 1000 mg twice daily with a meal - POCT HgB A1C  2. Essential hypertension: stable - Comprehensive metabolic panel  3. Mixed hyperlipidemia - Lipid panel  4. Need for hepatitis C screening test - Hepatitis C Antibody  5. Screening for prostate cancer - PSA    Plan:    Further f/u pending lab work.

## 2018-03-18 NOTE — Patient Instructions (Signed)
We will call you with the lab results. Please get them fasting.

## 2018-03-25 DIAGNOSIS — E782 Mixed hyperlipidemia: Secondary | ICD-10-CM | POA: Diagnosis not present

## 2018-03-25 DIAGNOSIS — Z125 Encounter for screening for malignant neoplasm of prostate: Secondary | ICD-10-CM | POA: Diagnosis not present

## 2018-03-25 DIAGNOSIS — I10 Essential (primary) hypertension: Secondary | ICD-10-CM | POA: Diagnosis not present

## 2018-03-25 DIAGNOSIS — Z1159 Encounter for screening for other viral diseases: Secondary | ICD-10-CM | POA: Diagnosis not present

## 2018-03-26 ENCOUNTER — Other Ambulatory Visit: Payer: Self-pay | Admitting: Family Medicine

## 2018-03-26 DIAGNOSIS — G959 Disease of spinal cord, unspecified: Secondary | ICD-10-CM

## 2018-03-26 LAB — COMPREHENSIVE METABOLIC PANEL
ALBUMIN: 4.5 g/dL (ref 3.5–5.5)
ALT: 26 IU/L (ref 0–44)
AST: 24 IU/L (ref 0–40)
Albumin/Globulin Ratio: 1.5 (ref 1.2–2.2)
Alkaline Phosphatase: 67 IU/L (ref 39–117)
BUN / CREAT RATIO: 16 (ref 9–20)
BUN: 13 mg/dL (ref 6–24)
Bilirubin Total: 0.4 mg/dL (ref 0.0–1.2)
CALCIUM: 9.5 mg/dL (ref 8.7–10.2)
CO2: 19 mmol/L — AB (ref 20–29)
CREATININE: 0.79 mg/dL (ref 0.76–1.27)
Chloride: 101 mmol/L (ref 96–106)
GFR calc non Af Amer: 100 mL/min/{1.73_m2} (ref >59)
GFR, EST AFRICAN AMERICAN: 116 mL/min/{1.73_m2} (ref >59)
GLOBULIN, TOTAL: 3.1 g/dL (ref 1.5–4.5)
Glucose: 200 mg/dL — ABNORMAL HIGH (ref 65–99)
Potassium: 4.6 mmol/L (ref 3.5–5.2)
SODIUM: 138 mmol/L (ref 134–144)
TOTAL PROTEIN: 7.6 g/dL (ref 6.0–8.5)

## 2018-03-26 LAB — PSA: PROSTATE SPECIFIC AG, SERUM: 1.4 ng/mL (ref 0.0–4.0)

## 2018-03-26 LAB — LIPID PANEL
CHOLESTEROL TOTAL: 206 mg/dL — AB (ref 100–199)
Chol/HDL Ratio: 4.8 ratio (ref 0.0–5.0)
HDL: 43 mg/dL (ref >39)
LDL Calculated: 142 mg/dL — ABNORMAL HIGH (ref 0–99)
Triglycerides: 103 mg/dL (ref 0–149)
VLDL Cholesterol Cal: 21 mg/dL (ref 5–40)

## 2018-03-26 LAB — HEPATITIS C ANTIBODY: Hep C Virus Ab: <0.1 s/co ratio (ref 0.0–0.9)

## 2018-04-10 ENCOUNTER — Other Ambulatory Visit: Payer: Self-pay | Admitting: Family Medicine

## 2018-04-10 DIAGNOSIS — E119 Type 2 diabetes mellitus without complications: Secondary | ICD-10-CM

## 2018-04-22 ENCOUNTER — Ambulatory Visit: Payer: Medicare HMO

## 2018-04-23 ENCOUNTER — Telehealth: Payer: Self-pay | Admitting: Family Medicine

## 2018-04-23 NOTE — Telephone Encounter (Signed)
Pt requesting his a change to his Metformin 1000 MG be changed to Rapid Metformin.  States his brother informed him of this new medication .    [facility]

## 2018-04-24 ENCOUNTER — Ambulatory Visit: Payer: Medicare HMO

## 2018-04-24 ENCOUNTER — Other Ambulatory Visit: Payer: Self-pay | Admitting: Family Medicine

## 2018-04-24 MED ORDER — METFORMIN HCL ER 750 MG PO TB24: ORAL_TABLET | ORAL | 3 refills | Status: DC

## 2018-04-24 NOTE — Telephone Encounter (Signed)
[facility] to [facility].  Will take with evening meal.

## 2018-04-24 NOTE — Telephone Encounter (Signed)
Patient states that he prefers the extended release once a day. KW

## 2018-04-24 NOTE — Telephone Encounter (Signed)
See if he is wanting the extended release version that you take once a day.

## 2018-04-29 ENCOUNTER — Ambulatory Visit: Payer: Medicare HMO

## 2018-06-16 ENCOUNTER — Other Ambulatory Visit: Payer: Self-pay | Admitting: Family Medicine

## 2018-06-16 DIAGNOSIS — E119 Type 2 diabetes mellitus without complications: Secondary | ICD-10-CM

## 2018-06-17 ENCOUNTER — Ambulatory Visit: Payer: Self-pay | Admitting: Family Medicine

## 2018-06-18 ENCOUNTER — Ambulatory Visit: Payer: Self-pay | Admitting: Family Medicine

## 2018-06-20 ENCOUNTER — Other Ambulatory Visit: Payer: Self-pay | Admitting: Family Medicine

## 2018-06-20 ENCOUNTER — Ambulatory Visit (INDEPENDENT_AMBULATORY_CARE_PROVIDER_SITE_OTHER): Payer: Medicare HMO | Admitting: Family Medicine

## 2018-06-20 ENCOUNTER — Encounter: Payer: Self-pay | Admitting: Family Medicine

## 2018-06-20 VITALS — BP 122/80 | HR 78 | Temp 98.1°F | Resp 16 | Ht 72.0 in | Wt 206.0 lb

## 2018-06-20 DIAGNOSIS — I10 Essential (primary) hypertension: Secondary | ICD-10-CM

## 2018-06-20 DIAGNOSIS — E119 Type 2 diabetes mellitus without complications: Secondary | ICD-10-CM

## 2018-06-20 LAB — POCT GLYCOSYLATED HEMOGLOBIN (HGB A1C): Hemoglobin A1C: 8 % — AB (ref 4.0–5.6)

## 2018-06-20 MED ORDER — PIOGLITAZONE HCL 15 MG PO TABS: 15.0000 mg | ORAL_TABLET | Freq: Every day | ORAL | 0 refills | Status: DC

## 2018-06-20 NOTE — Patient Instructions (Signed)
Let me know if you can't tolerate the new medication. 

## 2018-06-20 NOTE — Progress Notes (Signed)
  Subjective:     Patient ID: Connor [name], male   DOB: 09/02/1961, 57 y.o.   MRN: [id number] Chief Complaint  Patient presents with  . Diabetes    Patient comes in today for a follow up. Meformin was increased  to 750 XR. Patient reports that he is tolerating medication well.   . Hypertension  . Hyperlipidemia   HPI Reports he has been traveling a lot to take care of his sick parents in [place]. He cancelled the occupational therapy evaluation for cervical myelopathy due to this. States he has kept up with eye exams.  Review of Systems  Respiratory: Negative for shortness of breath.   Cardiovascular: Negative for chest pain and palpitations.       Objective:   Physical Exam  Constitutional: He appears well-developed and well-nourished. No distress.  Cardiovascular: Normal rate and regular rhythm.  Pulmonary/Chest: Breath sounds normal.  Musculoskeletal: He exhibits no edema (of lower extremities).       Assessment:    1. Controlled type 2 diabetes mellitus without complication, without long-term current use of insulin (HCC): discussed GLP-1 and SLGT2 medications as well. - POCT glycosylated hemoglobin (Hb A1C) - pioglitazone (ACTOS) 15 MG tablet; Take 1 tablet (15 mg total) by mouth daily.  Dispense: 90 tablet; Refill: 0  2. Essential hypertension: controlled on present medication    Plan:    May call if not tolerating new medication. Will refer back to O.T.when social situation allows.

## 2018-07-18 ENCOUNTER — Telehealth: Payer: Self-pay | Admitting: Family Medicine

## 2018-07-18 ENCOUNTER — Other Ambulatory Visit: Payer: Self-pay | Admitting: Family Medicine

## 2018-07-18 DIAGNOSIS — I10 Essential (primary) hypertension: Secondary | ICD-10-CM

## 2018-07-18 DIAGNOSIS — E1169 Type 2 diabetes mellitus with other specified complication: Secondary | ICD-10-CM

## 2018-07-18 DIAGNOSIS — E785 Hyperlipidemia, unspecified: Principal | ICD-10-CM

## 2018-07-18 MED ORDER — ATORVASTATIN CALCIUM 40 MG PO TABS: 40.0000 mg | ORAL_TABLET | Freq: Every day | ORAL | 0 refills | Status: DC

## 2018-07-18 MED ORDER — LISINOPRIL 20 MG PO TABS: 20.0000 mg | ORAL_TABLET | Freq: Every day | ORAL | 0 refills | Status: DC

## 2018-07-18 NOTE — Telephone Encounter (Signed)
Pt states he actually needed lisinopril 20 MG call into the pharmacy instead of the other.

## 2018-07-18 NOTE — Telephone Encounter (Signed)
Please review. Thanks!  

## 2018-07-18 NOTE — Telephone Encounter (Signed)
Pt requesting 2 week refill of Atrovastin 40 MG [facility] to [facility] [address]

## 2018-07-18 NOTE — Telephone Encounter (Signed)
done

## 2018-07-26 ENCOUNTER — Other Ambulatory Visit: Payer: Self-pay | Admitting: Family Medicine

## 2018-07-26 DIAGNOSIS — I10 Essential (primary) hypertension: Secondary | ICD-10-CM

## 2018-07-29 ENCOUNTER — Other Ambulatory Visit: Payer: Self-pay | Admitting: Family Medicine

## 2018-07-29 DIAGNOSIS — I10 Essential (primary) hypertension: Secondary | ICD-10-CM

## 2018-07-30 ENCOUNTER — Telehealth: Payer: Self-pay | Admitting: Family Medicine

## 2018-07-30 NOTE — Telephone Encounter (Signed)
Prescription sent 07/28/18 to [facility] Mail order. KW

## 2018-07-30 NOTE — Telephone Encounter (Signed)
[facility] faxed a refill request for the following medication. Thanks CC  lisinopril (PRINIVIL,ZESTRIL) 20 MG tablet

## 2018-08-06 ENCOUNTER — Other Ambulatory Visit: Payer: Self-pay | Admitting: Family Medicine

## 2018-08-18 ENCOUNTER — Telehealth: Payer: Self-pay | Admitting: Family Medicine

## 2018-08-18 ENCOUNTER — Other Ambulatory Visit: Payer: Self-pay | Admitting: Family Medicine

## 2018-08-18 MED ORDER — METFORMIN HCL 850 MG PO TABS: 850.0000 mg | ORAL_TABLET | Freq: Two times a day (BID) | ORAL | 1 refills | Status: DC

## 2018-08-18 NOTE — Telephone Encounter (Signed)
If he is willing to take metformin twice daily we can put him on 850 or 1000 twice daily

## 2018-08-18 NOTE — Telephone Encounter (Signed)
Patient advised he states that he can agree to taking 850mg  BID. He request that Rx be sent to [facility] mail order. KW

## 2018-08-18 NOTE — Telephone Encounter (Signed)
Please advise if you want Connor [name] to return to office visit to discuss, Connor [name] was last seen for a DM check on 06/20/18 HgbA1C and hemoglobin was down and Connor [name] reported good compliance on dose. KW

## 2018-08-18 NOTE — Telephone Encounter (Signed)
done

## 2018-08-18 NOTE — Telephone Encounter (Signed)
Pt called saying he has been taking Metformin 750 24 hr.  He was taking Metformin 850 TID  He does not think the 750 /24 hr is strong enough.  His blood glucose has been a round 200 and use to be 100 when he was on the 850   Please advise  CB#  [phone]  Thanks teri

## 2018-08-22 ENCOUNTER — Other Ambulatory Visit: Payer: Self-pay | Admitting: Family Medicine

## 2018-08-22 DIAGNOSIS — E119 Type 2 diabetes mellitus without complications: Secondary | ICD-10-CM

## 2018-09-05 ENCOUNTER — Ambulatory Visit (INDEPENDENT_AMBULATORY_CARE_PROVIDER_SITE_OTHER): Payer: Medicare HMO | Admitting: Family Medicine

## 2018-09-05 DIAGNOSIS — Z23 Encounter for immunization: Secondary | ICD-10-CM | POA: Diagnosis not present

## 2018-09-19 ENCOUNTER — Ambulatory Visit: Payer: Self-pay | Admitting: Family Medicine

## 2018-09-22 ENCOUNTER — Ambulatory Visit (INDEPENDENT_AMBULATORY_CARE_PROVIDER_SITE_OTHER): Payer: Medicare HMO | Admitting: Family Medicine

## 2018-09-22 ENCOUNTER — Encounter: Payer: Self-pay | Admitting: Family Medicine

## 2018-09-22 VITALS — BP 128/82 | HR 90 | Temp 98.3°F | Wt 208.8 lb

## 2018-09-22 DIAGNOSIS — E119 Type 2 diabetes mellitus without complications: Secondary | ICD-10-CM | POA: Diagnosis not present

## 2018-09-22 LAB — POCT GLYCOSYLATED HEMOGLOBIN (HGB A1C): Hemoglobin A1C: 7.5 % — AB (ref 4.0–5.6)

## 2018-09-22 MED ORDER — PIOGLITAZONE HCL 30 MG PO TABS: 30.0000 mg | ORAL_TABLET | Freq: Every day | ORAL | 0 refills | Status: DC

## 2018-09-22 NOTE — Progress Notes (Signed)
  Subjective:     Patient ID: Connor [name], male   DOB: [birth year], 57 y.o.   MRN: [id number] Chief Complaint  Patient presents with  . Diabetes    Patient presents today for follow-up on DM. Patient denies any symptoms at this time.   HPI Reports tolerance of pioglitazone. No leg swelling or shortness of breath.  Review of Systems     Objective:   Physical Exam  Cardiovascular: Normal rate and regular rhythm.  Pulmonary/Chest: Breath sounds normal.  Musculoskeletal: He exhibits no edema (of distal lower extremities).       Assessment:    1. Diabetes mellitus without complication [facility]): will increase pioglitazone for improved control. - POCT glycosylated hemoglobin (Hb A1C)    Plan:    Monitor for increased swelling on medication.

## 2018-09-22 NOTE — Patient Instructions (Signed)
Let me know if you can't tolerate the increased dose of piogligtazone with increased swelling or trouble breathing.

## 2018-09-23 ENCOUNTER — Ambulatory Visit: Payer: Self-pay | Admitting: Family Medicine

## 2018-10-09 ENCOUNTER — Other Ambulatory Visit: Payer: Self-pay | Admitting: Family Medicine

## 2018-11-06 ENCOUNTER — Other Ambulatory Visit: Payer: Self-pay | Admitting: Family Medicine

## 2018-11-06 ENCOUNTER — Telehealth: Payer: Self-pay | Admitting: Family Medicine

## 2018-11-06 DIAGNOSIS — E1169 Type 2 diabetes mellitus with other specified complication: Secondary | ICD-10-CM

## 2018-11-06 DIAGNOSIS — E785 Hyperlipidemia, unspecified: Principal | ICD-10-CM

## 2018-11-06 MED ORDER — CYCLOBENZAPRINE HCL 10 MG PO TABS: 10.0000 mg | ORAL_TABLET | Freq: Three times a day (TID) | ORAL | 0 refills | Status: DC | PRN

## 2018-11-06 MED ORDER — GLUCOSE BLOOD VI STRP: ORAL_STRIP | 3 refills | Status: DC

## 2018-11-06 MED ORDER — OMEPRAZOLE 40 MG PO CPDR: 40.0000 mg | DELAYED_RELEASE_CAPSULE | Freq: Every day | ORAL | 3 refills | Status: DC | PRN

## 2018-11-06 MED ORDER — ATORVASTATIN CALCIUM 40 MG PO TABS: 40.0000 mg | ORAL_TABLET | Freq: Every day | ORAL | 3 refills | Status: DC

## 2018-11-06 NOTE — Telephone Encounter (Signed)
Pt asking Nadine CountsBob to call him back regarding some prescriptions.  Thanks, [employer]

## 2018-11-06 NOTE — Telephone Encounter (Signed)
Please review.  dbs 

## 2018-11-06 NOTE — Telephone Encounter (Signed)
Medications clarified and refilled

## 2018-11-27 ENCOUNTER — Other Ambulatory Visit: Payer: Self-pay | Admitting: Family Medicine

## 2018-12-11 DIAGNOSIS — H524 Presbyopia: Secondary | ICD-10-CM | POA: Diagnosis not present

## 2018-12-23 ENCOUNTER — Ambulatory Visit (INDEPENDENT_AMBULATORY_CARE_PROVIDER_SITE_OTHER): Payer: Medicare HMO | Admitting: Family Medicine

## 2018-12-23 ENCOUNTER — Encounter: Payer: Self-pay | Admitting: Family Medicine

## 2018-12-23 VITALS — BP 110/88 | HR 96 | Temp 98.1°F | Resp 16 | Wt 207.0 lb

## 2018-12-23 DIAGNOSIS — E119 Type 2 diabetes mellitus without complications: Secondary | ICD-10-CM

## 2018-12-23 DIAGNOSIS — G959 Disease of spinal cord, unspecified: Secondary | ICD-10-CM

## 2018-12-23 LAB — POCT GLYCOSYLATED HEMOGLOBIN (HGB A1C): Hemoglobin A1C: 9.1 % — AB (ref 4.0–5.6)

## 2018-12-23 NOTE — Progress Notes (Signed)
  Subjective:     Patient ID: Connor [name], male   DOB: 05/09/1961, 58 y.o.   MRN: [id number] Chief Complaint  Patient presents with  . Follow-up  . Diabetes   HPI States he had a lot of dietary indiscretions over the holidays.Reports tolerating the higher dose of pioglitazone. Wishes referral to occupational therapy at this time.  Review of Systems  Respiratory: Negative for shortness of breath.   Cardiovascular: Negative for chest pain and palpitations.       Objective:   Physical Exam Constitutional:      General: He is not in acute distress. Neurological:     Mental Status: He is alert.   Lungs: clear Heart: RRR without murmur Lower extremities: no edema; pedal pulses intact, sensation to monofilament intact, no wounds noted.     Assessment:    1. Diabetes mellitus without complication [facility]): continue current medication. Patient wishes to work more on dietary choices. - POCT glycosylated hemoglobin (Hb A1C)  2. Cervical myelopathy (HCC) - Ambulatory referral to Occupational Therapy    Plan:    Will re-establish with Connor [name] due to my retirement.

## 2018-12-23 NOTE — Patient Instructions (Signed)
You will get a call about rehab.

## 2018-12-25 ENCOUNTER — Other Ambulatory Visit: Payer: Self-pay | Admitting: Family Medicine

## 2018-12-25 MED ORDER — METFORMIN HCL 850 MG PO TABS: 850.0000 mg | ORAL_TABLET | Freq: Two times a day (BID) | ORAL | 1 refills | Status: DC

## 2019-01-07 ENCOUNTER — Other Ambulatory Visit: Payer: Self-pay | Admitting: Family Medicine

## 2019-01-07 DIAGNOSIS — H5015 Alternating exotropia: Secondary | ICD-10-CM | POA: Diagnosis not present

## 2019-01-07 DIAGNOSIS — E119 Type 2 diabetes mellitus without complications: Secondary | ICD-10-CM

## 2019-01-26 ENCOUNTER — Ambulatory Visit (INDEPENDENT_AMBULATORY_CARE_PROVIDER_SITE_OTHER): Payer: Medicare HMO | Admitting: Family Medicine

## 2019-01-26 ENCOUNTER — Other Ambulatory Visit (HOSPITAL_COMMUNITY)
Admission: RE | Admit: 2019-01-26 | Discharge: 2019-01-26 | Disposition: A | Discharge status: Home / Self Care | Payer: Medicare HMO | Source: Ambulatory Visit | Attending: Family Medicine | Admitting: Family Medicine

## 2019-01-26 ENCOUNTER — Telehealth: Payer: Self-pay | Admitting: Family Medicine

## 2019-01-26 ENCOUNTER — Encounter: Payer: Self-pay | Admitting: Family Medicine

## 2019-01-26 VITALS — BP 150/90 | HR 78 | Temp 97.8°F | Resp 16 | Wt 211.0 lb

## 2019-01-26 DIAGNOSIS — Z202 Contact with and (suspected) exposure to infections with a predominantly sexual mode of transmission: Secondary | ICD-10-CM | POA: Insufficient documentation

## 2019-01-26 MED ORDER — CEFIXIME 400 MG PO CAPS: 400.0000 mg | ORAL_CAPSULE | Freq: Every day | ORAL | 0 refills | Status: DC

## 2019-01-26 MED ORDER — AZITHROMYCIN 1 G PO PACK: 1.0000 g | PACK | Freq: Once | ORAL | 0 refills | Status: AC

## 2019-01-26 NOTE — Telephone Encounter (Signed)
Confirmed for GC exposure. Awaiting final urine and STD panel report.

## 2019-01-26 NOTE — Progress Notes (Signed)
Patient: Connor [name] Male    DOB: 1961-07-23   58 y.o.   MRN: [id number] Visit Date: 01/26/2019  Today's Provider: Vernie Murders, PA   Exposure to [facility]  Subjective:     HPI  Having some dysuria over the past 2 months. Last sexual partner diagnosed with GC (last contact was 2 months ago). No fever or rashes.   Past Medical History:  Diagnosis Date  . Arthritis    cervical spondylosis, myelopathy   . Diabetes mellitus without complication ([place])    controlled  . GERD (gastroesophageal reflux disease)   . Hyperlipidemia   . Hypertension    controlled  . Lazy eye, left    Past Surgical History:  Procedure Laterality Date  . ANTERIOR CERVICAL DECOMPRESSION/DISCECTOMY FUSION 4 LEVELS N/A 01/21/2017   Procedure: ANTERIOR CERVICAL DECOMPRESSION/DISCECTOMY FUSION , INTERBODY PROSTHESIS,PLATE CERVICAL THREE- CERVICAL FOUR,CERVICAL FOUR- CERVICAL FIVE, CERVICAL FIVE- CERVICAL SIX, CERVICAL SIX- CERVICAL SEVEN;  Surgeon: Newman Pies, MD;  Location: [facility];  Service: Neurosurgery;  Laterality: N/A;  . CARPAL TUNNEL RELEASE Right 01/13/2018   Procedure: CARPAL TUNNEL RELEASE RIGHT;  Surgeon: Newman Pies, MD;  Location: [facility];  Service: Neurosurgery;  Laterality: Right;  . COLONOSCOPY WITH PROPOFOL N/A 08/30/2015   Procedure: COLONOSCOPY WITH PROPOFOL;  Surgeon: Lucilla Lame, MD;  Location: [facility];  Service: Endoscopy;  Laterality: N/A;   History reviewed. No pertinent family history.  Allergies  Allergen Reactions  . No Known Allergies     Current Outpatient Medications:  .  ACCU-CHEK SOFTCLIX LANCETS lancets, CHECK  SUGAR  DAILY, Disp: 100 each, Rfl: 3 .  Alcohol Swabs (B-D SINGLE USE SWABS REGULAR) PADS, CHECK  SUGAR  DAILY, Disp: 100 each, Rfl: 3 .  atorvastatin (LIPITOR) 40 MG tablet, Take 1 tablet (40 mg total) by mouth daily., Disp: 90 tablet, Rfl: 3 .  Blood Glucose Monitoring Suppl (ACCU-CHEK AVIVA PLUS) w/Device KIT, CHECK  SUGAR  DAILY, Disp: 1 kit,  Rfl: 0 .  cyclobenzaprine (FLEXERIL) 10 MG tablet, Take 1 tablet (10 mg total) by mouth 3 (three) times daily as needed for muscle spasms., Disp: 90 tablet, Rfl: 0 .  docusate sodium (COLACE) 100 MG capsule, Take 1 capsule (100 mg total) by mouth 2 (two) times daily. (Patient taking differently: Take 100 mg by mouth 2 (two) times daily as needed for mild constipation. ), Disp: 60 capsule, Rfl: 0 .  glipiZIDE (GLUCOTROL XL) 10 MG 24 hr tablet, TAKE 2 TABLETS EVERY DAY, Disp: 180 tablet, Rfl: 1 .  glucose blood (ACCU-CHEK AVIVA PLUS) test strip, CHECK  SUGAR  DAILY, Disp: 100 each, Rfl: 3 .  hydrochlorothiazide (HYDRODIURIL) 25 MG tablet, TAKE 1 TABLET (25 MG TOTAL) DAILY, Disp: 90 tablet, Rfl: 3 .  lisinopril (PRINIVIL,ZESTRIL) 20 MG tablet, TAKE 1 TABLET (20 MG TOTAL) DAILY, Disp: 90 tablet, Rfl: 3 .  metFORMIN (GLUCOPHAGE) 850 MG tablet, Take 1 tablet (850 mg total) by mouth 2 (two) times daily with a meal., Disp: 180 tablet, Rfl: 1 .  Multiple Vitamin (MULTIVITAMIN WITH MINERALS) TABS tablet, Take 1 tablet by mouth daily., Disp: , Rfl:  .  Omega-3 Fatty Acids (OMEGA 3 500 PO), Take 500 mg by mouth daily., Disp: , Rfl:  .  omeprazole (PRILOSEC) 40 MG capsule, Take 1 capsule (40 mg total) by mouth daily as needed (for heartburn/indigestion.)., Disp: 90 capsule, Rfl: 3 .  pioglitazone (ACTOS) 30 MG tablet, TAKE 1 TABLET EVERY DAY, Disp: 90 tablet, Rfl: 0 .  tadalafil (CIALIS) 20 MG tablet, Take 20 mg by mouth daily as needed for erectile dysfunction. Reported on 04/23/2016, Disp: , Rfl:   Review of Systems  Constitutional: Negative for appetite change, chills and fever.  Respiratory: Negative for chest tightness, shortness of breath and wheezing.   Cardiovascular: Negative for chest pain and palpitations.  Gastrointestinal: Negative for abdominal pain, nausea and vomiting.   Social History   Tobacco Use  . Smoking status: Never Smoker  . Smokeless tobacco: Never Used  Substance Use Topics  .  Alcohol use: No    Alcohol/week: 0.0 standard drinks     Objective:   BP (!) 150/90 (BP Location: Right Arm, Patient Position: Sitting, Cuff Size: Large)   Pulse 78   Temp 97.8 F (36.6 C) (Oral)   Resp 16   Wt 211 lb (95.7 kg)   SpO2 99%   BMI 28.62 kg/m  Vitals:   01/26/19 1013  BP: (!) 150/90  Pulse: 78  Resp: 16  Temp: 97.8 F (36.6 C)  TempSrc: Oral  SpO2: 99%  Weight: 211 lb (95.7 kg)   Physical Exam Constitutional:      General: He is not in acute distress.    Appearance: He is well-developed.  HENT:     Head: Normocephalic and atraumatic.     Right Ear: Hearing normal.     Left Ear: Hearing normal.     Nose: Nose normal.     Mouth/Throat:     Pharynx: Oropharynx is clear.  Eyes:     General: Lids are normal. No scleral icterus.       Right eye: No discharge.        Left eye: No discharge.     Conjunctiva/sclera: Conjunctivae normal.  Cardiovascular:     Rate and Rhythm: Normal rate and regular rhythm.     Heart sounds: Normal heart sounds.  Pulmonary:     Effort: Pulmonary effort is normal. No respiratory distress.  Abdominal:     General: Bowel sounds are normal.  Musculoskeletal: Normal range of motion.  Skin:    Findings: No lesion or rash.  Neurological:     Mental Status: He is alert and oriented to person, place, and time.  Psychiatric:        Speech: Speech normal.        Behavior: Behavior normal.        Thought Content: Thought content normal.       Assessment & Plan    1. STD exposure Male sexual partner diagnosed with GC. Patient having some burning with urination intermittently but no discharge over the past 2 months (last intercourse was 2 months ago). No fever or rashes. Will treat with single doses of Cefixime and Azithromycin. Will get screening labs and report positives to the Health Department. Recheck pending reports. - GC/chlamydia probe amp, genital - RPR - cefixime (SUPRAX) 400 MG CAPS capsule; Take 1 capsule (400 mg  total) by mouth daily.  Dispense: 1 capsule; Refill: 0 - azithromycin (ZITHROMAX) 1 g powder; Take 1 packet (1 g total) by mouth once for 1 dose.  Dispense: 1 each; Refill: 0 - HIV antibody (with reflex)     Vernie Murders, PA  [facility]

## 2019-01-26 NOTE — Telephone Encounter (Signed)
Please advise 

## 2019-01-26 NOTE — Telephone Encounter (Signed)
°  [facility] [address] ([address]),  - [address] [phone] (Phone) [phone] (Fax)   Calling to get verification on the follow Rx's: cefixime (SUPRAX) 400 MG CAPS capsule azithromycin (ZITHROMAX) 1 g powder  Please verify,  Thanks, [facility]

## 2019-01-27 ENCOUNTER — Ambulatory Visit: Payer: Medicare HMO

## 2019-01-27 LAB — URINE CYTOLOGY ANCILLARY ONLY
Chlamydia: NEGATIVE
Neisseria Gonorrhea: NEGATIVE

## 2019-01-27 LAB — HIV ANTIBODY (ROUTINE TESTING W REFLEX): HIV Screen 4th Generation wRfx: NONREACTIVE

## 2019-01-27 LAB — RPR: RPR Ser Ql: NONREACTIVE

## 2019-01-27 NOTE — Telephone Encounter (Signed)
Pt returned call ° °teri °

## 2019-01-27 NOTE — Telephone Encounter (Signed)
-----   Message from Tamsen Roers, [place] sent at 01/27/2019  8:31 AM EDT ----- Preliminary report shows normal blood tests. No sign of HIV virus or syphilis. Will contact again when the remainder of test results are available.

## 2019-01-27 NOTE — Telephone Encounter (Signed)
LMTCB

## 2019-01-27 NOTE — Telephone Encounter (Signed)
LMOVM

## 2019-01-28 ENCOUNTER — Telehealth: Payer: Self-pay

## 2019-01-28 ENCOUNTER — Telehealth: Payer: Self-pay | Admitting: Family Medicine

## 2019-01-28 DIAGNOSIS — H5010 Unspecified exotropia: Secondary | ICD-10-CM

## 2019-01-28 NOTE — Telephone Encounter (Signed)
-----   Message from Tamsen Roers, [place] sent at 01/27/2019  5:34 PM EDT ----- All tests are negative for infection. Recheck if having any further discomfort.

## 2019-01-28 NOTE — Telephone Encounter (Signed)
PLEASE SEE BELOW.

## 2019-01-28 NOTE — Telephone Encounter (Signed)
Unable to reach patient at this time voicemail box is not set up, will try reaching patient again at a later time. KW

## 2019-01-28 NOTE — Telephone Encounter (Signed)
Gunnar Fusi at [facility] call saying they need a referral done for Mr Kasdorf to see a Dr. Verne Carrow in [facility] [address] [place].  [facility] referred him to this doctor for Exotropia and pt will need eye muscle surgery.  She said [employer] requires a PCP referral.  He had an appt yesterday but they could not see him because [employer] needed this referral  CB# for Gunnar Fusi at [facility] is [phone]  Fax to Dr. Sinclair Ship office  [phone] Their phone number is [phone]  Thanks Barth Kirks

## 2019-02-02 NOTE — Telephone Encounter (Signed)
Per [employer] Pt needs to be referred to MD that is in network.He has no out of network benefits (Dr Maple Hudson is not in network) Reference # [id number]

## 2019-02-03 ENCOUNTER — Ambulatory Visit: Payer: Medicare HMO

## 2019-02-03 NOTE — Telephone Encounter (Signed)
See other message from 01/26/2019.

## 2019-02-03 NOTE — Telephone Encounter (Signed)
No answer and no vm. Will try again later.  

## 2019-02-04 NOTE — Telephone Encounter (Signed)
Patient was notified of results. Expressed understanding.  

## 2019-02-09 ENCOUNTER — Other Ambulatory Visit: Payer: Self-pay | Admitting: Family Medicine

## 2019-02-09 MED ORDER — PIOGLITAZONE HCL 30 MG PO TABS: 30.0000 mg | ORAL_TABLET | Freq: Every day | ORAL | 3 refills | Status: DC

## 2019-02-09 NOTE — Telephone Encounter (Signed)
[facility] faxed refill request for the following medications:  pioglitazone (ACTOS) 30 MG tablet     Please advise.

## 2019-02-23 ENCOUNTER — Encounter: Payer: Medicare HMO | Admitting: Occupational Therapy

## 2019-02-25 ENCOUNTER — Encounter: Payer: Medicare HMO | Admitting: Occupational Therapy

## 2019-03-03 ENCOUNTER — Encounter: Payer: Medicare HMO | Admitting: Occupational Therapy

## 2019-03-05 ENCOUNTER — Encounter: Payer: Medicare HMO | Admitting: Occupational Therapy

## 2019-03-11 ENCOUNTER — Encounter: Payer: Medicare HMO | Admitting: Occupational Therapy

## 2019-03-17 ENCOUNTER — Encounter: Payer: Medicare HMO | Admitting: Occupational Therapy

## 2019-03-23 ENCOUNTER — Encounter: Payer: Medicare HMO | Admitting: Occupational Therapy

## 2019-03-25 ENCOUNTER — Encounter: Payer: Medicare HMO | Admitting: Occupational Therapy

## 2019-03-30 ENCOUNTER — Encounter: Payer: Medicare HMO | Admitting: Occupational Therapy

## 2019-04-01 ENCOUNTER — Encounter: Payer: Medicare HMO | Admitting: Occupational Therapy

## 2019-04-06 ENCOUNTER — Encounter: Payer: Medicare HMO | Admitting: Occupational Therapy

## 2019-04-08 ENCOUNTER — Other Ambulatory Visit: Payer: Self-pay | Admitting: Family Medicine

## 2019-04-08 ENCOUNTER — Encounter: Payer: Medicare HMO | Admitting: Occupational Therapy

## 2019-04-08 MED ORDER — CYCLOBENZAPRINE HCL 10 MG PO TABS: 10.0000 mg | ORAL_TABLET | Freq: Three times a day (TID) | ORAL | 1 refills | Status: DC | PRN

## 2019-04-08 NOTE — Telephone Encounter (Signed)
[facility] faxed refill request for the following medications:  cyclobenzaprine (FLEXERIL) 10 MG tablet     Please advise.

## 2019-04-14 DIAGNOSIS — H5015 Alternating exotropia: Secondary | ICD-10-CM | POA: Diagnosis not present

## 2019-04-22 ENCOUNTER — Ambulatory Visit: Payer: Self-pay | Admitting: Family Medicine

## 2019-04-27 ENCOUNTER — Other Ambulatory Visit: Payer: Self-pay | Admitting: Family Medicine

## 2019-04-27 DIAGNOSIS — E119 Type 2 diabetes mellitus without complications: Secondary | ICD-10-CM

## 2019-04-27 DIAGNOSIS — I10 Essential (primary) hypertension: Secondary | ICD-10-CM

## 2019-04-27 NOTE — Telephone Encounter (Signed)
[facility] faxed refill request for the following medications:  1. hydrochlorothiazide (HYDRODIURIL) 25 MG tablet 2. lisinopril (PRINIVIL,ZESTRIL) 20 MG tablet  3. glipiZIDE (GLUCOTROL XL) 10 MG 24 hr tablet 4. metFORMIN (GLUCOPHAGE) 850 MG tablet  90 day supply Pt was seeing Mikki Santee but is scheduled to see Dr. Caryn Section 04/28/2019. Please advise. Thanks TNP

## 2019-04-28 ENCOUNTER — Ambulatory Visit (INDEPENDENT_AMBULATORY_CARE_PROVIDER_SITE_OTHER): Payer: Medicare HMO | Admitting: Family Medicine

## 2019-04-28 ENCOUNTER — Other Ambulatory Visit: Payer: Self-pay

## 2019-04-28 ENCOUNTER — Encounter: Payer: Self-pay | Admitting: Family Medicine

## 2019-04-28 VITALS — BP 123/80 | HR 108 | Temp 98.1°F | Wt 207.0 lb

## 2019-04-28 DIAGNOSIS — E119 Type 2 diabetes mellitus without complications: Secondary | ICD-10-CM

## 2019-04-28 DIAGNOSIS — Z125 Encounter for screening for malignant neoplasm of prostate: Secondary | ICD-10-CM

## 2019-04-28 DIAGNOSIS — I10 Essential (primary) hypertension: Secondary | ICD-10-CM

## 2019-04-28 DIAGNOSIS — E782 Mixed hyperlipidemia: Secondary | ICD-10-CM | POA: Diagnosis not present

## 2019-04-28 LAB — POCT GLYCOSYLATED HEMOGLOBIN (HGB A1C): Hemoglobin A1C: 10.5 % — AB (ref 4.0–5.6)

## 2019-04-28 MED ORDER — ERTUGLIFLOZIN L-PYROGLUTAMICAC 5 MG PO TABS: 5.0000 mg | ORAL_TABLET | Freq: Every day | ORAL | 0 refills | Status: DC

## 2019-04-28 MED ORDER — GLIPIZIDE ER 10 MG PO TB24: 20.0000 mg | ORAL_TABLET | Freq: Every day | ORAL | 1 refills | Status: DC

## 2019-04-28 MED ORDER — LISINOPRIL 20 MG PO TABS: ORAL_TABLET | ORAL | 3 refills | Status: DC

## 2019-04-28 MED ORDER — HYDROCHLOROTHIAZIDE 25 MG PO TABS: ORAL_TABLET | ORAL | 3 refills | Status: DC

## 2019-04-28 MED ORDER — METFORMIN HCL 850 MG PO TABS: 850.0000 mg | ORAL_TABLET | Freq: Two times a day (BID) | ORAL | 1 refills | Status: DC

## 2019-04-28 NOTE — Progress Notes (Signed)
Patient: Connor [name] Male    DOB: 1960-12-09   58 y.o.   MRN: [id number] Visit Date: 04/28/2019  Today's Provider: Lelon Huh, MD   Chief Complaint  Patient presents with  . Hyperlipidemia  . Hypertension  . Diabetes   Subjective:     HPI    Diabetes Mellitus Type II, Follow-up:   Lab Results  Component Value Date   HGBA1C 9.1 (A) 12/23/2018   HGBA1C 7.5 (A) 09/22/2018   HGBA1C 8.0 (A) 06/20/2018   Last seen for diabetes 4 months ago.  Management since then includes No changes. He reports excellent compliance with treatment. He is not having side effects.  Current symptoms include none and have been stable. Home blood sugar records: fasting range: 150's  Episodes of hypoglycemia? no   Current Insulin Regimen: None Most Recent Eye Exam: UTD Weight trend: stable Prior visit with dietician: no Current diet: in general, a "healthy" diet   Current exercise: yard work  ------------------------------------------------------------------------   Hypertension, follow-up:  BP Readings from Last 3 Encounters:  01/26/19 (!) 150/90  12/23/18 110/88  09/22/18 128/82    He was last seen for hypertension 10 months ago.  BP at that visit was 122/80. Management since that visit includes No changes He reports excellent compliance with treatment. He is not having side effects.  He is exercising. He is adherent to low salt diet.   Outside blood pressures are 140's/90's. He is experiencing chest pain, fatigue, irregular heart beat and palpitations.  Patient denies none.   Cardiovascular risk factors include advanced age (older than 41 for men, 55 for women), diabetes mellitus, dyslipidemia and male gender.  Use of agents associated with hypertension: none.   ------------------------------------------------------------------------    Lipid/Cholesterol, Follow-up:   Last seen for this 1 years ago.  Management since that visit includes Encouraged to take  atorvastatin daily.  Last Lipid Panel:    Component Value Date/Time   CHOL 206 (H) 03/25/2018 1538   TRIG 103 03/25/2018 1538   HDL 43 03/25/2018 1538   CHOLHDL 4.8 03/25/2018 1538   LDLCALC 142 (H) 03/25/2018 1538    He reports excellent compliance with treatment. He is not having side effects.   Wt Readings from Last 3 Encounters:  01/26/19 211 lb (95.7 kg)  12/23/18 207 lb (93.9 kg)  09/22/18 208 lb 12.8 oz (94.7 kg)    ------------------------------------------------------------------------    Allergies  Allergen Reactions  . No Known Allergies      Current Outpatient Medications:  .  atorvastatin (LIPITOR) 40 MG tablet, Take 1 tablet (40 mg total) by mouth daily., Disp: 90 tablet, Rfl: 3 .  cefixime (SUPRAX) 400 MG CAPS capsule, Take 1 capsule (400 mg total) by mouth daily., Disp: 1 capsule, Rfl: 0 .  cyclobenzaprine (FLEXERIL) 10 MG tablet, Take 1 tablet (10 mg total) by mouth 3 (three) times daily as needed for muscle spasms., Disp: 90 tablet, Rfl: 1 .  docusate sodium (COLACE) 100 MG capsule, Take 1 capsule (100 mg total) by mouth 2 (two) times daily. (Patient taking differently: Take 100 mg by mouth 2 (two) times daily as needed for mild constipation. ), Disp: 60 capsule, Rfl: 0 .  glipiZIDE (GLUCOTROL XL) 10 MG 24 hr tablet, Take 2 tablets (20 mg total) by mouth daily., Disp: 180 tablet, Rfl: 1 .  glucose blood (ACCU-CHEK AVIVA PLUS) test strip, CHECK  SUGAR  DAILY, Disp: 100 each, Rfl: 3 .  hydrochlorothiazide (HYDRODIURIL) 25 MG tablet,  TAKE 1 TABLET (25 MG TOTAL) DAILY, Disp: 90 tablet, Rfl: 3 .  lisinopril (ZESTRIL) 20 MG tablet, TAKE 1 TABLET (20 MG TOTAL) DAILY, Disp: 90 tablet, Rfl: 3 .  metFORMIN (GLUCOPHAGE) 850 MG tablet, Take 1 tablet (850 mg total) by mouth 2 (two) times daily with a meal., Disp: 180 tablet, Rfl: 1 .  Multiple Vitamin (MULTIVITAMIN WITH MINERALS) TABS tablet, Take 1 tablet by mouth daily., Disp: , Rfl:  .  Omega-3 Fatty Acids (OMEGA 3  500 PO), Take 500 mg by mouth daily., Disp: , Rfl:  .  omeprazole (PRILOSEC) 40 MG capsule, Take 1 capsule (40 mg total) by mouth daily as needed (for heartburn/indigestion.)., Disp: 90 capsule, Rfl: 3 .  pioglitazone (ACTOS) 30 MG tablet, Take 1 tablet (30 mg total) by mouth daily., Disp: 90 tablet, Rfl: 3 .  tadalafil (CIALIS) 20 MG tablet, Take 20 mg by mouth daily as needed for erectile dysfunction. Reported on 04/23/2016, Disp: , Rfl:  .  ACCU-CHEK SOFTCLIX LANCETS lancets, CHECK  SUGAR  DAILY, Disp: 100 each, Rfl: 3 .  Alcohol Swabs (B-D SINGLE USE SWABS REGULAR) PADS, CHECK  SUGAR  DAILY, Disp: 100 each, Rfl: 3 .  Blood Glucose Monitoring Suppl (ACCU-CHEK AVIVA PLUS) w/Device KIT, CHECK  SUGAR  DAILY, Disp: 1 kit, Rfl: 0  Review of Systems  Constitutional: Negative.   Respiratory: Negative.   Cardiovascular: Negative.   Gastrointestinal: Negative.   Endocrine: Negative.   Musculoskeletal: Negative.   Neurological: Negative for dizziness, light-headedness and headaches.    Social History   Tobacco Use  . Smoking status: Never Smoker  . Smokeless tobacco: Never Used  Substance Use Topics  . Alcohol use: No    Alcohol/week: 0.0 standard drinks      Objective:   BP 123/80 (BP Location: Right Arm, Patient Position: Sitting, Cuff Size: Normal)   Pulse (!) 108   Temp 98.1 F (36.7 C) (Oral)   Wt 207 lb (93.9 kg)   BMI 28.07 kg/m     Physical Exam   General Appearance:    Alert, cooperative, no distress  Eyes:    PERRL, conjunctiva/corneas clear, EOM's intact       Lungs:     Clear to auscultation bilaterally, respirations unlabored  Heart:    Regular rate and rhythm  Neurologic:   Awake, alert, oriented x 3. No apparent focal neurological           defect.       Results for orders placed or performed in visit on 04/28/19  POCT glycosylated hemoglobin (Hb A1C)  Result Value Ref Range   Hemoglobin A1C 10.5 (A) 4.0 - 5.6 %       Assessment & Plan    1. Essential  hypertension Well controlled.  Continue current medications.    2. Mixed hyperlipidemia He is tolerating atorvastatin well with no adverse effects.   - Lipid panel - Comprehensive metabolic panel  3. Prostate cancer screening  - PSA  4. Diabetes mellitus without complication (HCC) Uncontrolled. Try adding samples of steglatro.  - Comprehensive metabolic panel - Ertugliflozin L-PyroglutamicAc (STEGLATRO) 5 MG TABS; Take 5 mg by mouth daily.  Dispense: 35 tablet; Refill: 0 - POCT glycosylated hemoglobin (Hb A1C)     Lelon Huh, MD  [facility]

## 2019-04-28 NOTE — Patient Instructions (Signed)
.   Please review the attached list of medications and notify my office if there are any errors.   . Please bring all of your medications to every appointment so we can make sure that our medication list is the same as yours.   

## 2019-04-29 LAB — COMPREHENSIVE METABOLIC PANEL
ALT: 33 IU/L (ref 0–44)
AST: 27 IU/L (ref 0–40)
Albumin/Globulin Ratio: 1.6 (ref 1.2–2.2)
Albumin: 4.8 g/dL (ref 3.8–4.9)
Alkaline Phosphatase: 72 IU/L (ref 39–117)
BUN/Creatinine Ratio: 15 (ref 9–20)
BUN: 16 mg/dL (ref 6–24)
Bilirubin Total: 0.5 mg/dL (ref 0.0–1.2)
CO2: 20 mmol/L (ref 20–29)
Calcium: 10.3 mg/dL — ABNORMAL HIGH (ref 8.7–10.2)
Chloride: 98 mmol/L (ref 96–106)
Creatinine, Ser: 1.08 mg/dL (ref 0.76–1.27)
GFR calc Af Amer: 88 mL/min/{1.73_m2} (ref >59)
GFR calc non Af Amer: 76 mL/min/{1.73_m2} (ref >59)
Globulin, Total: 3 g/dL (ref 1.5–4.5)
Glucose: 204 mg/dL — ABNORMAL HIGH (ref 65–99)
Potassium: 4.7 mmol/L (ref 3.5–5.2)
Sodium: 136 mmol/L (ref 134–144)
Total Protein: 7.8 g/dL (ref 6.0–8.5)

## 2019-04-29 LAB — LIPID PANEL
Chol/HDL Ratio: 2.9 ratio (ref 0.0–5.0)
Cholesterol, Total: 117 mg/dL (ref 100–199)
HDL: 40 mg/dL (ref >39)
LDL Calculated: 54 mg/dL (ref 0–99)
Triglycerides: 114 mg/dL (ref 0–149)
VLDL Cholesterol Cal: 23 mg/dL (ref 5–40)

## 2019-04-29 LAB — PSA: Prostate Specific Ag, Serum: 1.5 ng/mL (ref 0.0–4.0)

## 2019-05-12 ENCOUNTER — Other Ambulatory Visit: Payer: Self-pay | Admitting: Family Medicine

## 2019-05-26 ENCOUNTER — Telehealth: Payer: Self-pay | Admitting: Family Medicine

## 2019-05-26 MED ORDER — JARDIANCE 10 MG PO TABS: 5.0000 mg | ORAL_TABLET | Freq: Every day | ORAL | 0 refills | Status: DC

## 2019-05-26 NOTE — Telephone Encounter (Signed)
He can try samples of Jardiance 10mg  tablets, but just take 1/2 tablet daily. He can have 3 bottles.

## 2019-05-26 NOTE — Telephone Encounter (Signed)
Pt states he had to stop taking the medication yesterday.  He states it was making him urinate too much and he could not tolerate it.  Do you want him to try a different medicine?   Thanks,   -Mickel Baas

## 2019-05-26 NOTE — Telephone Encounter (Signed)
Patient was given samples steglatro for diabetes in June. Please see if he is having any trouble with samples. If tolerating well, then need to order renal panel and we will get him prescription if kidney functions and electrolytes are stable.

## 2019-05-26 NOTE — Telephone Encounter (Signed)
Samples at the front desk.  Pt advised.   Thanks,   -Teren Zurcher

## 2019-06-18 ENCOUNTER — Other Ambulatory Visit: Payer: Self-pay | Admitting: Family Medicine

## 2019-06-18 NOTE — Telephone Encounter (Signed)
Dr. Caryn Section, please review refill request for this medication. I'm not sure of the directions.

## 2019-06-18 NOTE — Telephone Encounter (Signed)
Pt was given samples of empagliflozin (JARDIANCE) 10 MG TABS tablet.  Pt is requesting a prescription of empagliflozin (JARDIANCE) 10 MG TABS tablet to be called into [place] (N), [place] [phone] (Phone) [phone] (Fax)   Thanks, [employer]

## 2019-06-19 ENCOUNTER — Other Ambulatory Visit: Payer: Self-pay | Admitting: Family Medicine

## 2019-06-19 MED ORDER — JARDIANCE 10 MG PO TABS: 5.0000 mg | ORAL_TABLET | Freq: Every day | ORAL | 6 refills | Status: DC

## 2019-06-22 ENCOUNTER — Ambulatory Visit: Payer: Self-pay | Admitting: Ophthalmology

## 2019-07-01 ENCOUNTER — Telehealth: Payer: Self-pay | Admitting: Family Medicine

## 2019-07-01 DIAGNOSIS — H5015 Alternating exotropia: Secondary | ICD-10-CM | POA: Diagnosis not present

## 2019-07-01 NOTE — Telephone Encounter (Signed)
Pt states he is needing another medication prescribed to him that is less out of pocket than the empagliflozin (JARDIANCE) 10 MG TABS tablet.  He could not pick it up because it costs too much.  Pt also wants a call back to let him know when this is done please.  Pt uses: Product/process development scientist [address] ([address]), [place] [phone] (Phone) [phone] (Fax)     Thanks, [employer]

## 2019-07-02 NOTE — Telephone Encounter (Signed)
Patient advised as below. Patient verbalizes understanding and is in agreement with treatment plan.  

## 2019-07-02 NOTE — Telephone Encounter (Signed)
Have him finish Jardiance he has and I will leave Invokana 100mg  q am at front desk--1 month supply then f/u with Dr Caryn Section about what to do going forward.

## 2019-07-06 ENCOUNTER — Other Ambulatory Visit: Payer: Self-pay

## 2019-07-06 ENCOUNTER — Encounter (HOSPITAL_BASED_OUTPATIENT_CLINIC_OR_DEPARTMENT_OTHER): Payer: Self-pay | Admitting: *Deleted

## 2019-07-07 ENCOUNTER — Encounter (HOSPITAL_BASED_OUTPATIENT_CLINIC_OR_DEPARTMENT_OTHER)
Admission: RE | Admit: 2019-07-07 | Discharge: 2019-07-07 | Disposition: A | Discharge status: Home / Self Care | Payer: Medicare Other | Source: Ambulatory Visit | Attending: Ophthalmology | Admitting: Ophthalmology

## 2019-07-07 ENCOUNTER — Other Ambulatory Visit (HOSPITAL_COMMUNITY)
Admission: RE | Admit: 2019-07-07 | Discharge: 2019-07-07 | Disposition: A | Discharge status: Home / Self Care | Payer: Medicare Other | Source: Ambulatory Visit | Attending: Ophthalmology | Admitting: Ophthalmology

## 2019-07-07 DIAGNOSIS — Z01812 Encounter for preprocedural laboratory examination: Secondary | ICD-10-CM | POA: Diagnosis not present

## 2019-07-07 DIAGNOSIS — Z20828 Contact with and (suspected) exposure to other viral communicable diseases: Secondary | ICD-10-CM | POA: Insufficient documentation

## 2019-07-07 DIAGNOSIS — H5010 Unspecified exotropia: Secondary | ICD-10-CM | POA: Diagnosis not present

## 2019-07-07 DIAGNOSIS — Z01818 Encounter for other preprocedural examination: Secondary | ICD-10-CM | POA: Insufficient documentation

## 2019-07-07 LAB — BASIC METABOLIC PANEL
Anion gap: 12 (ref 5–15)
BUN: 13 mg/dL (ref 6–20)
CO2: 24 mmol/L (ref 22–32)
Calcium: 9.8 mg/dL (ref 8.9–10.3)
Chloride: 104 mmol/L (ref 98–111)
Creatinine, Ser: 1.15 mg/dL (ref 0.61–1.24)
GFR calc Af Amer: >60 mL/min (ref >60)
GFR calc non Af Amer: >60 mL/min (ref >60)
Glucose, Bld: 138 mg/dL — ABNORMAL HIGH (ref 70–99)
Potassium: 3.9 mmol/L (ref 3.5–5.1)
Sodium: 140 mmol/L (ref 135–145)

## 2019-07-07 LAB — SARS CORONAVIRUS 2 (TAT 6-24 HRS): SARS Coronavirus 2: NEGATIVE

## 2019-07-07 NOTE — Progress Notes (Signed)
EKG reviewed by Dr. Foster, will proceed with surgery as scheduled. 

## 2019-07-10 ENCOUNTER — Other Ambulatory Visit: Payer: Self-pay

## 2019-07-10 ENCOUNTER — Ambulatory Visit (HOSPITAL_BASED_OUTPATIENT_CLINIC_OR_DEPARTMENT_OTHER): Payer: Medicare Other | Admitting: Certified Registered"

## 2019-07-10 ENCOUNTER — Ambulatory Visit (HOSPITAL_BASED_OUTPATIENT_CLINIC_OR_DEPARTMENT_OTHER)
Admission: RE | Admit: 2019-07-10 | Discharge: 2019-07-10 | Disposition: A | Discharge status: Home / Self Care | Payer: Medicare Other | Attending: Ophthalmology | Admitting: Ophthalmology

## 2019-07-10 ENCOUNTER — Encounter (HOSPITAL_BASED_OUTPATIENT_CLINIC_OR_DEPARTMENT_OTHER): Payer: Self-pay | Admitting: Anesthesiology

## 2019-07-10 ENCOUNTER — Encounter (HOSPITAL_BASED_OUTPATIENT_CLINIC_OR_DEPARTMENT_OTHER)
Admission: RE | Disposition: A | Discharge status: Home / Self Care | Payer: Self-pay | Source: Home / Self Care | Attending: Ophthalmology

## 2019-07-10 DIAGNOSIS — I10 Essential (primary) hypertension: Secondary | ICD-10-CM | POA: Insufficient documentation

## 2019-07-10 DIAGNOSIS — H5015 Alternating exotropia: Secondary | ICD-10-CM | POA: Diagnosis not present

## 2019-07-10 DIAGNOSIS — E119 Type 2 diabetes mellitus without complications: Secondary | ICD-10-CM | POA: Insufficient documentation

## 2019-07-10 DIAGNOSIS — H5010 Unspecified exotropia: Secondary | ICD-10-CM | POA: Insufficient documentation

## 2019-07-10 DIAGNOSIS — Z7984 Long term (current) use of oral hypoglycemic drugs: Secondary | ICD-10-CM | POA: Diagnosis not present

## 2019-07-10 DIAGNOSIS — K219 Gastro-esophageal reflux disease without esophagitis: Secondary | ICD-10-CM | POA: Diagnosis not present

## 2019-07-10 HISTORY — PX: STRABISMUS SURGERY: SHX218

## 2019-07-10 LAB — GLUCOSE, CAPILLARY
Glucose-Capillary: 96 mg/dL (ref 70–99)
Glucose-Capillary: 114 mg/dL — ABNORMAL HIGH (ref 70–99)

## 2019-07-10 SURGERY — REPAIR STRABISMUS
Anesthesia: General | Site: Eye | Laterality: Bilateral

## 2019-07-10 MED ORDER — LIDOCAINE 2% (20 MG/ML) 5 ML SYRINGE
INTRAMUSCULAR | Status: AC
  Filled 2019-07-10: qty 5

## 2019-07-10 MED ORDER — MIDAZOLAM HCL 2 MG/2ML IJ SOLN
1.0000 mg | INTRAMUSCULAR | Status: DC | PRN
  Administered 2019-07-10: 2 mg via INTRAVENOUS

## 2019-07-10 MED ORDER — PHENYLEPHRINE HCL (PRESSORS) 10 MG/ML IV SOLN
INTRAVENOUS | Status: AC
  Filled 2019-07-10: qty 1

## 2019-07-10 MED ORDER — ACETAMINOPHEN 500 MG PO TABS
ORAL_TABLET | ORAL | Status: AC
  Filled 2019-07-10: qty 2

## 2019-07-10 MED ORDER — GLYCOPYRROLATE 0.2 MG/ML IJ SOLN
INTRAMUSCULAR | Status: DC | PRN
  Administered 2019-07-10: 0.2 mg via INTRAVENOUS

## 2019-07-10 MED ORDER — OXYCODONE HCL 5 MG PO TABS: 5.0000 mg | ORAL_TABLET | Freq: Once | ORAL | Status: DC | PRN

## 2019-07-10 MED ORDER — DEXAMETHASONE SODIUM PHOSPHATE 10 MG/ML IJ SOLN
INTRAMUSCULAR | Status: AC
  Filled 2019-07-10: qty 1

## 2019-07-10 MED ORDER — LACTATED RINGERS IV SOLN
INTRAVENOUS | Status: DC
  Administered 2019-07-10: 09:00:00 via INTRAVENOUS

## 2019-07-10 MED ORDER — ACETAMINOPHEN 500 MG PO TABS
1000.0000 mg | ORAL_TABLET | Freq: Once | ORAL | Status: AC
  Administered 2019-07-10: 1000 mg via ORAL

## 2019-07-10 MED ORDER — FENTANYL CITRATE (PF) 100 MCG/2ML IJ SOLN
50.0000 ug | INTRAMUSCULAR | Status: DC | PRN
  Administered 2019-07-10: 100 ug via INTRAVENOUS
  Administered 2019-07-10: 25 ug via INTRAVENOUS

## 2019-07-10 MED ORDER — DEXAMETHASONE SODIUM PHOSPHATE 4 MG/ML IJ SOLN
INTRAMUSCULAR | Status: DC | PRN
  Administered 2019-07-10: 10 mg via INTRAVENOUS

## 2019-07-10 MED ORDER — HYDROMORPHONE HCL 1 MG/ML IJ SOLN: 0.2500 mg | INTRAMUSCULAR | Status: DC | PRN

## 2019-07-10 MED ORDER — TOBRAMYCIN-DEXAMETHASONE 0.3-0.1 % OP OINT
TOPICAL_OINTMENT | OPHTHALMIC | Status: DC | PRN
  Administered 2019-07-10: 1 via OPHTHALMIC

## 2019-07-10 MED ORDER — FENTANYL CITRATE (PF) 100 MCG/2ML IJ SOLN
INTRAMUSCULAR | Status: AC
  Filled 2019-07-10: qty 2

## 2019-07-10 MED ORDER — ONDANSETRON HCL 4 MG/2ML IJ SOLN
INTRAMUSCULAR | Status: DC | PRN
  Administered 2019-07-10: 4 mg via INTRAVENOUS

## 2019-07-10 MED ORDER — KETOROLAC TROMETHAMINE 30 MG/ML IJ SOLN: 30.0000 mg | Freq: Once | INTRAMUSCULAR | Status: DC | PRN

## 2019-07-10 MED ORDER — MIDAZOLAM HCL 2 MG/2ML IJ SOLN
INTRAMUSCULAR | Status: AC
  Filled 2019-07-10: qty 2

## 2019-07-10 MED ORDER — OXYCODONE HCL 5 MG/5ML PO SOLN: 5.0000 mg | Freq: Once | ORAL | Status: DC | PRN

## 2019-07-10 MED ORDER — KETOROLAC TROMETHAMINE 30 MG/ML IJ SOLN
INTRAMUSCULAR | Status: AC
  Filled 2019-07-10: qty 1

## 2019-07-10 MED ORDER — SCOPOLAMINE 1 MG/3DAYS TD PT72: 1.0000 | MEDICATED_PATCH | Freq: Once | TRANSDERMAL | Status: DC

## 2019-07-10 MED ORDER — EPHEDRINE SULFATE 50 MG/ML IJ SOLN
INTRAMUSCULAR | Status: DC | PRN
  Administered 2019-07-10: 10 mg via INTRAVENOUS

## 2019-07-10 MED ORDER — GLYCOPYRROLATE PF 0.2 MG/ML IJ SOSY
PREFILLED_SYRINGE | INTRAMUSCULAR | Status: AC
  Filled 2019-07-10: qty 1

## 2019-07-10 MED ORDER — ONDANSETRON HCL 4 MG/2ML IJ SOLN
INTRAMUSCULAR | Status: AC
  Filled 2019-07-10: qty 2

## 2019-07-10 MED ORDER — PROMETHAZINE HCL 25 MG/ML IJ SOLN: 6.2500 mg | INTRAMUSCULAR | Status: DC | PRN

## 2019-07-10 MED ORDER — PROPOFOL 10 MG/ML IV BOLUS
INTRAVENOUS | Status: DC | PRN
  Administered 2019-07-10: 200 mg via INTRAVENOUS

## 2019-07-10 MED ORDER — LIDOCAINE 2% (20 MG/ML) 5 ML SYRINGE
INTRAMUSCULAR | Status: DC | PRN
  Administered 2019-07-10: 50 mg via INTRAVENOUS

## 2019-07-10 MED ORDER — SODIUM CHLORIDE 0.9 % IV SOLN
INTRAVENOUS | Status: DC | PRN
  Administered 2019-07-10: 50 ug/min via INTRAVENOUS

## 2019-07-10 SURGICAL SUPPLY — 33 items
APPLICATOR COTTON TIP 6 STRL (MISCELLANEOUS) ×4 IMPLANT
APPLICATOR COTTON TIP 6IN STRL (MISCELLANEOUS) ×12
APPLICATOR DR MATTHEWS STRL (MISCELLANEOUS) ×3 IMPLANT
BNDG EYE OVAL (GAUZE/BANDAGES/DRESSINGS) IMPLANT
CAUTERY EYE LOW TEMP 1300F FIN (OPHTHALMIC RELATED) IMPLANT
CLOSURE WOUND 1/4X4 (GAUZE/BANDAGES/DRESSINGS)
COVER BACK TABLE REUSABLE LG (DRAPES) ×3 IMPLANT
COVER MAYO STAND REUSABLE (DRAPES) ×3 IMPLANT
COVER WAND RF STERILE (DRAPES) IMPLANT
DRAPE HALF SHEET 70X43 (DRAPES) IMPLANT
DRAPE SPLIT 6X30 W/TAPE (DRAPES) ×3 IMPLANT
DRAPE SURG 17X23 STRL (DRAPES) ×6 IMPLANT
GLOVE BIO SURGEON STRL SZ 6.5 (GLOVE) ×4 IMPLANT
GLOVE BIO SURGEONS STRL SZ 6.5 (GLOVE) ×2
GLOVE BIOGEL M STRL SZ7.5 (GLOVE) ×3 IMPLANT
GOWN STRL REUS W/ TWL LRG LVL3 (GOWN DISPOSABLE) ×1 IMPLANT
GOWN STRL REUS W/TWL LRG LVL3 (GOWN DISPOSABLE) ×2
GOWN STRL REUS W/TWL XL LVL3 (GOWN DISPOSABLE) ×3 IMPLANT
NS IRRIG 1000ML POUR BTL (IV SOLUTION) ×3 IMPLANT
PACK BASIN DAY SURGERY FS (CUSTOM PROCEDURE TRAY) ×3 IMPLANT
SPEAR EYE SURG WECK-CEL (MISCELLANEOUS) ×9 IMPLANT
STRIP CLOSURE SKIN 1/4X4 (GAUZE/BANDAGES/DRESSINGS) IMPLANT
SUT 6 0 SILK T G140 8DA (SUTURE) IMPLANT
SUT MERSILENE 6-0 18IN S14 8MM (SUTURE)
SUT PLAIN 6 0 TG1408 (SUTURE) ×3 IMPLANT
SUT SILK 4 0 C 3 735G (SUTURE) IMPLANT
SUT VICRYL 6 0 S 28 (SUTURE) ×6 IMPLANT
SUT VICRYL ABS 6-0 S29 18IN (SUTURE) ×6 IMPLANT
SUTURE MERSLN 6-0 18IN S14 8MM (SUTURE) IMPLANT
SYR 10ML LL (SYRINGE) ×3 IMPLANT
SYR TB 1ML LL NO SAFETY (SYRINGE) ×3 IMPLANT
TOWEL GREEN STERILE FF (TOWEL DISPOSABLE) ×3 IMPLANT
TRAY DSU PREP LF (CUSTOM PROCEDURE TRAY) ×3 IMPLANT

## 2019-07-10 NOTE — Anesthesia Procedure Notes (Signed)
Procedure Name: LMA Insertion Performed by: Mazi Schuff W, CRNA Pre-anesthesia Checklist: Patient identified, Emergency Drugs available, Suction available and Patient being monitored Patient Re-evaluated:Patient Re-evaluated prior to induction Oxygen Delivery Method: Circle system utilized Preoxygenation: Pre-oxygenation with 100% oxygen Induction Type: IV induction Ventilation: Mask ventilation without difficulty LMA: LMA flexible inserted LMA Size: 4.0 Number of attempts: 1 Placement Confirmation: positive ETCO2 Tube secured with: Tape Dental Injury: Teeth and Oropharynx as per pre-operative assessment        

## 2019-07-10 NOTE — Discharge Instructions (Signed)
Diet: Clear liquids, advance to soft foods then regular diet as tolerated by this evening. ° °Pain control:  ° 1)  Ibuprofen 600 mg by mouth every 6-8 hours as needed for pain ° 2)  Ice pack/cold compress to operated eye(s) as desired ° °Eye medications:    Tobradex or Zylet eye ointment 1/2 inch in operated eye(s) twice a day  ° °Activity: No swimming for 1 week.  It is OK to let water run over the face and eyes while showering or taking a bath, even during the first week.  No other restriction on exercise or activity. ° ° ° °Call Dr. Young's office [phone] with any problems or concerns. ° ° °Post Anesthesia Home Care Instructions ° °Activity: °Get plenty of rest for the remainder of the day. A responsible individual must stay with you for 24 hours following the procedure.  °For the next 24 hours, DO NOT: °-Drive a car °-Operate machinery °-Drink alcoholic beverages °-Take any medication unless instructed by your physician °-Make any legal decisions or sign important papers. ° °Meals: °Start with liquid foods such as gelatin or soup. Progress to regular foods as tolerated. Avoid greasy, spicy, heavy foods. If nausea and/or vomiting occur, drink only clear liquids until the nausea and/or vomiting subsides. Call your physician if vomiting continues. ° °Special Instructions/Symptoms: °Your throat may feel dry or sore from the anesthesia or the breathing tube placed in your throat during surgery. If this causes discomfort, gargle with warm salt water. The discomfort should disappear within 24 hours. ° °If you had a scopolamine patch placed behind your ear for the management of post- operative nausea and/or vomiting: ° °1. The medication in the patch is effective for 72 hours, after which it should be removed.  Wrap patch in a tissue and discard in the trash. Wash hands thoroughly with soap and water. °2. You may remove the patch earlier than 72 hours if you experience unpleasant side effects which may include dry  mouth, dizziness or visual disturbances. °3. Avoid touching the patch. Wash your hands with soap and water after contact with the patch. °  ° °

## 2019-07-10 NOTE — Anesthesia Preprocedure Evaluation (Addendum)
Anesthesia Evaluation  Patient identified by MRN, date of birth, ID band Patient awake    Reviewed: Allergy & Precautions, NPO status , Patient's Chart, lab work & pertinent test results  Airway Mallampati: III  TM Distance: >3 FB Neck ROM: Full    Dental no notable dental hx.    Pulmonary neg pulmonary ROS,    Pulmonary exam normal breath sounds clear to auscultation       Cardiovascular hypertension, Pt. on medications Normal cardiovascular exam Rhythm:Regular Rate:Normal  ECG: ST, rate 109   Neuro/Psych negative neurological ROS  negative psych ROS   GI/Hepatic Neg liver ROS, GERD  Medicated and Controlled,  Endo/Other  diabetes, Oral Hypoglycemic Agents  Renal/GU negative Renal ROS     Musculoskeletal negative musculoskeletal ROS (+)   Abdominal (+) + obese,   Peds  Hematology HLD   Anesthesia Other Findings EXOTROPIA  Reproductive/Obstetrics                            Anesthesia Physical Anesthesia Plan  ASA: III  Anesthesia Plan: General   Post-op Pain Management:    Induction: Intravenous  PONV Risk Score and Plan: 2 and Ondansetron, Dexamethasone, Midazolam and Treatment may vary due to age or medical condition  Airway Management Planned: LMA  Additional Equipment:   Intra-op Plan:   Post-operative Plan: Extubation in OR  Informed Consent: I have reviewed the patients History and Physical, chart, labs and discussed the procedure including the risks, benefits and alternatives for the proposed anesthesia with the patient or authorized representative who has indicated his/her understanding and acceptance.     Dental advisory given  Plan Discussed with: CRNA  Anesthesia Plan Comments:        Anesthesia Quick Evaluation

## 2019-07-10 NOTE — Anesthesia Postprocedure Evaluation (Signed)
Anesthesia Post Note  Patient: Connor [name]  Procedure(s) Performed: STRABISMUS REPAIR BOTH EYES (Bilateral Eye)     Patient location during evaluation: PACU Anesthesia Type: General Level of consciousness: awake and alert Pain management: pain level controlled Vital Signs Assessment: post-procedure vital signs reviewed and stable Respiratory status: spontaneous breathing, nonlabored ventilation, respiratory function stable and patient connected to nasal cannula oxygen Cardiovascular status: blood pressure returned to baseline and stable Postop Assessment: no apparent nausea or vomiting Anesthetic complications: no    Last Vitals:  Vitals:   07/10/19 1130 07/10/19 1200  BP: 127/89 136/90  Pulse: (!) 108 99  Resp: 13 16  Temp:  37.2 C  SpO2: 96% 96%    Last Pain:  Vitals:   07/10/19 1200  TempSrc:   PainSc: 0-No pain                 Hamlet Lasecki P Toy Eisemann

## 2019-07-10 NOTE — H&P (Signed)
Date of examination:  07-01-19  Indication for surgery: to straighten the eyes and allow some binocularity  Pertinent past medical history:  Past Medical History:  Diagnosis Date  . Arthritis    cervical spondylosis, myelopathy   . Diabetes mellitus without complication ([place])    controlled  . GERD (gastroesophageal reflux disease)   . Hyperlipidemia   . Hypertension    controlled  . Lazy eye, left     Pertinent ocular history:  XT "since birth," worse over the years  Pertinent family history: History reviewed. No pertinent family history.  General:  Healthy appearing patient in no distress.    Eyes:    Acuity [place]  OD 20/20  OS 20/20  External: Within normal limits     Anterior segment: Within normal limits     Motility:   XT=75, XT'=75-80  Fundus: deferred  Refraction:  [place] OU approx  Heart: Regular rate and rhythm without murmur     Lungs: Clear to auscultation     Impression:Exotropia, congenital by history  Plan: Lateral rectus muscle recession, both eyes, and medial rectus muscle resection, both eyes  Derry Skill

## 2019-07-10 NOTE — Interval H&P Note (Signed)
History and Physical Interval Note:  07/10/2019 8:46 AM  Connor [name]  has presented today for surgery, with the diagnosis of EXOTROPIA.  The various methods of treatment have been discussed with the patient and family. After consideration of risks, benefits and other options for treatment, the patient has consented to  Procedure(s): REPAIR STRABISMUS BOTH EYES (Bilateral) as a surgical intervention.  The patient's history has been reviewed, patient examined, no change in status, stable for surgery.  I have reviewed the patient's chart and labs.  Questions were answered to the patient's satisfaction.     Derry Skill

## 2019-07-10 NOTE — Op Note (Signed)
07/10/2019  11:01 AM  PATIENT:  Connor [name]    PRE-OPERATIVE DIAGNOSIS:  Exotropia  POST-OPERATIVE DIAGNOSIS:  same  PROCEDURE:  1. Lateral rectus muscle recession 7.5 mm both eyes   2.  Medial rectus muscle resection 6.5 mm  Both eyes    SURGEON:  Derry Skill, MD  ANESTHESIA:   General  COMPLICATIONS: none  OPERATIVE PROCEDURE: After routine preoperative evaluation including informed consent, the patient was taken to the operating room where He was identified by me. General anesthesia was induced without difficulty after placement of appropriate monitors. The patient was prepped and draped in standard sterile fashion. A lid speculum was placed in the right eye.   Through an inferotemporal fornix incision, the right lateral rectus muscle was engaged on a series of muscle hooks and cleared of its fascial attachments. The tendon was secured with a double-armed 6-0 Vicryl suture, with a locking bite at each border of the muscle, 1 mm from the insertion. The muscle was disinserted.  It was reattached to sclera at a measured distance of 7.5 mm posterior to the original insertion, using direct scleral passes in crossed swords fashion. The suture ends were tied securely after the position of the muscle had been checked and found to be accurate. Conjunctiva was closed with a single 6-0 plain gut suture.  Through an inferonasal fornix incision through conjunctiva and Tenon's fascia, the right medial rectus muscle was engaged on a series of muscle hooks and carefully cleared of its fascial attachments to at least 15 mm posterior to the insertion. The muscle was spread between 2 self-retaining hooks. A 2 mm bite was taken of the center of the muscle belly at a measured distance of 6.5 mm posterior to the insertion, and a knot was tied securely at this location. The needle at each end of the double-armed suture was passed from the center of the muscle belly to the periphery, parallel to and 6.5 mm  posterior to the insertion. A resection clamp was placed on the muscle just anterior to the sutures. The muscle was disinserted. Each pole suture was passed posteriorly to anteriorly through the corresponding end of the muscle stump, then anteriorly to posteriorly near the center of the stump, then posteriorly to anteriorly through the center of the muscle belly, just posterior to the previously placed knot.  The muscle was drawn up to the level of the original insertion, and all slack was removed.  The suture ends were tied securely. The resection clamp was removed.  The portion of the muscle anterior to the sutures was carefully excised. Conjunctiva was closed with a single 6-0 Vicryl suture.   The procedure was repeated on the left eye just as described for the right, again effecting a 7.5 mm recession of the lateral rectus muscle and a 6.5 mm resection of the medial rectus muscle.  Note that because I was having difficulty hooking the left lateral rectus muscle via the fornix incision, I created an additional limbal flap with a relaxing incision superotemporally to facilitate exposure; this flap was reapposed to the limbus with two 6-0 plain gut sutures. Tobradex ophthalmic ointment was placed in both eyes. The patient was awakened without difficulty and taken to the recovery room in stable condition, having suffered no intraoperative or immediate postoperative complications. Derry Skill, MD

## 2019-07-10 NOTE — Transfer of Care (Signed)
Immediate Anesthesia Transfer of Care Note  Patient: Connor [name]  Procedure(s) Performed: STRABISMUS REPAIR BOTH EYES (Bilateral Eye)  Patient Location: PACU  Anesthesia Type:General  Level of Consciousness: awake and sedated  Airway & Oxygen Therapy: Patient Spontanous Breathing and Patient connected to nasal cannula oxygen  Post-op Assessment: Report given to RN and Post -op Vital signs reviewed and stable  Post vital signs: Reviewed and stable  Last Vitals:  Vitals Value Taken Time  BP 121/78 07/10/19 1100  Temp    Pulse 95 07/10/19 1102  Resp 16 07/10/19 1102  SpO2 95 % 07/10/19 1102  Vitals shown include unvalidated device data.  Last Pain:  Vitals:   07/10/19 0806  TempSrc: Oral  PainSc: 0-No pain         Complications: No apparent anesthesia complications

## 2019-07-13 ENCOUNTER — Encounter (HOSPITAL_BASED_OUTPATIENT_CLINIC_OR_DEPARTMENT_OTHER): Payer: Self-pay | Admitting: Ophthalmology

## 2019-07-15 ENCOUNTER — Other Ambulatory Visit: Payer: Self-pay | Admitting: Family Medicine

## 2019-07-15 DIAGNOSIS — I10 Essential (primary) hypertension: Secondary | ICD-10-CM

## 2019-07-15 MED ORDER — LISINOPRIL 20 MG PO TABS: ORAL_TABLET | ORAL | 3 refills | Status: DC

## 2019-07-15 MED ORDER — METFORMIN HCL 850 MG PO TABS: 850.0000 mg | ORAL_TABLET | Freq: Two times a day (BID) | ORAL | 3 refills | Status: DC

## 2019-07-15 NOTE — Telephone Encounter (Signed)
[facility] faxed refill request for the following medications:  lisinopril (ZESTRIL) 20 MG tablet  metFORMIN (GLUCOPHAGE) 850 MG tablet   Please advise.

## 2019-07-16 ENCOUNTER — Telehealth: Payer: Self-pay

## 2019-07-16 MED ORDER — CANAGLIFLOZIN 100 MG PO TABS: 100.0000 mg | ORAL_TABLET | Freq: Every day | ORAL | 1 refills | Status: DC

## 2019-07-16 NOTE — Telephone Encounter (Signed)
Prescription sent to [facility]. He needs to schedule o.v. in a month to check a1c and kidney functions.

## 2019-07-16 NOTE — Telephone Encounter (Signed)
Patient requesting a prescription for Invokana 100mg  to be sent to the pharmacy. This medication is not listed in his chart, but he says he was given samples of this medication in the past. [facility] [address]

## 2019-07-16 NOTE — Telephone Encounter (Signed)
Pt advised.  Apt made for 08/18/2019 at 4pm.    Thanks,    -Mickel Baas

## 2019-08-18 ENCOUNTER — Encounter: Payer: Self-pay | Admitting: Family Medicine

## 2019-08-18 ENCOUNTER — Ambulatory Visit (INDEPENDENT_AMBULATORY_CARE_PROVIDER_SITE_OTHER): Payer: Medicare Other | Admitting: Family Medicine

## 2019-08-18 ENCOUNTER — Other Ambulatory Visit: Payer: Self-pay

## 2019-08-18 VITALS — BP 115/83 | HR 98 | Temp 96.9°F | Resp 16 | Ht 69.0 in | Wt 202.6 lb

## 2019-08-18 DIAGNOSIS — Z23 Encounter for immunization: Secondary | ICD-10-CM

## 2019-08-18 DIAGNOSIS — E119 Type 2 diabetes mellitus without complications: Secondary | ICD-10-CM | POA: Diagnosis not present

## 2019-08-18 LAB — POCT GLYCOSYLATED HEMOGLOBIN (HGB A1C)
Est. average glucose Bld gHb Est-mCnc: 143
Hemoglobin A1C: 6.6 % — AB (ref 4.0–5.6)

## 2019-08-18 MED ORDER — GLIPIZIDE ER 10 MG PO TB24: ORAL_TABLET | ORAL | Status: DC

## 2019-08-18 NOTE — Patient Instructions (Addendum)
.   Please review the attached list of medications and notify my office if there are any errors.   . Please bring all of your medications to every appointment so we can make sure that our medication list is the same as yours.   . Reduce glipizide to 1/2 tablet every day. Call your pharmacy and cancel any remaining refills of the 10mg  glipizide.

## 2019-08-18 NOTE — Progress Notes (Signed)
Diabetes Mellitus Type II, Follow-up:   Lab Results  Component Value Date   HGBA1C 6.6 (A) 08/18/2019   HGBA1C 10.5 (A) 04/28/2019   HGBA1C 9.1 (A) 12/23/2018         Patient: Connor [name] Male    DOB: 01/02/1961   58 y.o.   MRN: [id number] Visit Date: 08/18/2019  Today's Provider: Lelon Huh, MD   Chief Complaint  Patient presents with  . Diabetes   Subjective:     HPI Last seen for diabetes 3 months ago.  Management since then includes added samples of steglatro. He reports excellent compliance with treatment. He is not having side effects.  Current symptoms include none and have been stable. Home blood sugar records: fasting range: 110-115  Episodes of hypoglycemia? no   Current insulin regiment: Is not on insulin Most Recent Eye Exam: UTD Weight trend: decreasing steadily Prior visit with dietician: No Current exercise: yard work Current diet habits: not asked  Pertinent Labs:    Component Value Date/Time   CHOL 117 04/28/2019 1522   TRIG 114 04/28/2019 1522   HDL 40 04/28/2019 1522   LDLCALC 54 04/28/2019 1522   CREATININE 1.15 07/07/2019 1228    Wt Readings from Last 3 Encounters:  07/10/19 205 lb 0.4 oz (93 kg)  04/28/19 207 lb (93.9 kg)  01/26/19 211 lb (95.7 kg)    ------------------------------------------------------------------------  Hypertension, follow-up:  BP Readings from Last 3 Encounters:  07/10/19 136/90  04/28/19 123/80  01/26/19 (!) 150/90    He was last seen for hypertension 3 months ago.  BP at that visit was 123/80. Management changes since that visit include no changes. He reports excellent compliance with treatment. He is not having side effects.  He is not exercising. He is adherent to low salt diet.   Outside blood pressures are stable. He is experiencing none.  Patient denies chest pain.   Cardiovascular risk factors include advanced age (older than 43 for men, 76 for women), dyslipidemia and  hypertension.  Use of agents associated with hypertension: none.     Weight trend: decreasing steadily Wt Readings from Last 3 Encounters:  07/10/19 205 lb 0.4 oz (93 kg)  04/28/19 207 lb (93.9 kg)  01/26/19 211 lb (95.7 kg)    Current diet: not asked  ------------------------------------------------------------------------   Allergies  Allergen Reactions  . No Known Allergies      Current Outpatient Medications:  .  ACCU-CHEK SOFTCLIX LANCETS lancets, CHECK  SUGAR  DAILY, Disp: 100 each, Rfl: 3 .  Alcohol Swabs (B-D SINGLE USE SWABS REGULAR) PADS, CHECK  SUGAR  DAILY, Disp: 100 each, Rfl: 3 .  atorvastatin (LIPITOR) 40 MG tablet, Take 1 tablet (40 mg total) by mouth daily., Disp: 90 tablet, Rfl: 3 .  Blood Glucose Monitoring Suppl (ACCU-CHEK AVIVA PLUS) w/Device KIT, CHECK  SUGAR  DAILY, Disp: 1 kit, Rfl: 0 .  canagliflozin (INVOKANA) 100 MG TABS tablet, Take 1 tablet (100 mg total) by mouth daily before breakfast., Disp: 30 tablet, Rfl: 1 .  cyclobenzaprine (FLEXERIL) 10 MG tablet, TAKE 1 TABLET THREE TIMES DAILY AS NEEDED FOR MUSCLE SPASMS., Disp: 90 tablet, Rfl: 3 .  docusate sodium (COLACE) 100 MG capsule, Take 1 capsule (100 mg total) by mouth 2 (two) times daily. (Patient taking differently: Take 100 mg by mouth 2 (two) times daily as needed for mild constipation. ), Disp: 60 capsule, Rfl: 0 .  glipiZIDE (GLUCOTROL XL) 10 MG 24 hr tablet, Take 2 tablets (20 mg  total) by mouth daily. (Patient taking differently: Take 10 mg by mouth daily. ), Disp: 180 tablet, Rfl: 1 .  glucose blood (ACCU-CHEK AVIVA PLUS) test strip, CHECK  SUGAR  DAILY, Disp: 100 each, Rfl: 3 .  hydrochlorothiazide (HYDRODIURIL) 25 MG tablet, TAKE 1 TABLET (25 MG TOTAL) DAILY, Disp: 90 tablet, Rfl: 3 .  lisinopril (ZESTRIL) 20 MG tablet, TAKE 1 TABLET (20 MG TOTAL) DAILY, Disp: 90 tablet, Rfl: 3 .  metFORMIN (GLUCOPHAGE) 850 MG tablet, Take 1 tablet (850 mg total) by mouth 2 (two) times daily with a meal.,  Disp: 180 tablet, Rfl: 3 .  Multiple Vitamin (MULTIVITAMIN WITH MINERALS) TABS tablet, Take 1 tablet by mouth daily., Disp: , Rfl:  .  Omega-3 Fatty Acids (OMEGA 3 500 PO), Take 500 mg by mouth daily., Disp: , Rfl:  .  omeprazole (PRILOSEC) 40 MG capsule, Take 1 capsule (40 mg total) by mouth daily as needed (for heartburn/indigestion.)., Disp: 90 capsule, Rfl: 3 .  pioglitazone (ACTOS) 30 MG tablet, Take 1 tablet (30 mg total) by mouth daily., Disp: 90 tablet, Rfl: 3 .  tadalafil (CIALIS) 20 MG tablet, Take 20 mg by mouth daily as needed for erectile dysfunction. Reported on 04/23/2016, Disp: , Rfl:   Review of Systems  Constitutional: Negative.   Eyes: Negative.   Respiratory: Negative.   Cardiovascular: Negative.   Endocrine: Negative.     Social History   Tobacco Use  . Smoking status: Never Smoker  . Smokeless tobacco: Never Used  Substance Use Topics  . Alcohol use: No    Alcohol/week: 0.0 standard drinks      Objective:   BP 115/83 (BP Location: Left Arm, Patient Position: Sitting, Cuff Size: Large)   Pulse 98   Temp (!) 96.9 F (36.1 C) (Temporal)   Resp 16   Ht _0  (1.753 m)   Wt 202 lb 9.6 oz (91.9 kg)   BMI 29.92 kg/m  Vitals:   08/18/19 1601  BP: 115/83  Pulse: 98  Resp: 16  Temp: (!) 96.9 F (36.1 C)  TempSrc: Temporal  Weight: 202 lb 9.6 oz (91.9 kg)  Height: _1  (1.753 m)  Body mass index is 29.92 kg/m.   Physical Exam   General Appearance:    Overweight male in no acute distress  Eyes:    PERRL, conjunctiva/corneas clear, EOM's intact       Lungs:     Clear to auscultation bilaterally, respirations unlabored  Heart:    Normal heart rate. Normal rhythm. No murmurs, rubs, or gallops.   MS:   All extremities are intact.   Neurologic:   Awake, alert, oriented x 3. No apparent focal neurological           defect.        Results for orders placed or performed in visit on 08/18/19  POCT glycosylated hemoglobin (Hb A1C)  Result Value Ref Range    Hemoglobin A1C 6.6 (A) 4.0 - 5.6 %   Est. average glucose Bld gHb Est-mCnc 143        Assessment & Plan    1. Diabetes mellitus without complication ([place]) Much better with addition of Invokana. Will reduce- glipiZIDE (GLUCOTROL XL) 10 MG 24 hr tablet; to Take 1/2 tablet daily  2. Need for influenza vaccination  - Flu Vaccine QUAD 36+ mos IM  Follow up about 3 months.   The entirety of the information documented in the History of Present Illness, Review of Systems and Physical Exam were  personally obtained by me. Portions of this information were initially documented by Lynford Humphrey, CMA and reviewed by me for thoroughness and accuracy.       Lelon Huh, MD  [facility]

## 2019-09-21 ENCOUNTER — Other Ambulatory Visit: Payer: Self-pay | Admitting: Family Medicine

## 2019-09-21 MED ORDER — CANAGLIFLOZIN 100 MG PO TABS: 100.0000 mg | ORAL_TABLET | Freq: Every day | ORAL | 1 refills | Status: DC

## 2019-09-21 NOTE — Telephone Encounter (Signed)
[facility] faxed refill request for the following medications:  canagliflozin (INVOKANA) 100 MG TABS tablet   Please advise.

## 2019-10-29 ENCOUNTER — Other Ambulatory Visit: Payer: Self-pay | Admitting: Family Medicine

## 2019-10-29 DIAGNOSIS — E785 Hyperlipidemia, unspecified: Secondary | ICD-10-CM

## 2019-10-29 NOTE — Telephone Encounter (Signed)
[facility] faxed refill request for the following medications:  pioglitazone (ACTOS) 30 MG tablet  atorvastatin (LIPITOR) 40 MG tablet  cyclobenzaprine (FLEXERIL) 10 MG tablet    Please advise.

## 2019-10-31 MED ORDER — CYCLOBENZAPRINE HCL 10 MG PO TABS: ORAL_TABLET | ORAL | 4 refills | Status: DC

## 2019-10-31 MED ORDER — ATORVASTATIN CALCIUM 40 MG PO TABS: 40.0000 mg | ORAL_TABLET | Freq: Every day | ORAL | 4 refills | Status: AC

## 2019-10-31 MED ORDER — PIOGLITAZONE HCL 30 MG PO TABS: 30.0000 mg | ORAL_TABLET | Freq: Every day | ORAL | 4 refills | Status: AC

## 2019-11-23 ENCOUNTER — Ambulatory Visit: Payer: Self-pay | Admitting: Family Medicine

## 2019-11-23 NOTE — Progress Notes (Deleted)
Patient: Connor [name] Male    DOB: [birth year]   59 y.o.   MRN: [id number] Visit Date: 11/23/2019  Today's Provider: Lelon Huh, MD   No chief complaint on file.  Subjective:     HPI  Diabetes Mellitus Type II, Follow-up:   Lab Results  Component Value Date   HGBA1C 6.6 (A) 08/18/2019   HGBA1C 10.5 (A) 04/28/2019   HGBA1C 9.1 (A) 12/23/2018    Last seen for diabetes 4 months ago.  Management since then includes decreasing Glipizide to 73m daily. He reports {excellent/good/fair/poor:19665} compliance with treatment. He {ACTION; IS/IS NIZT:[id number]}having side effects. *** Current symptoms include {Symptoms; diabetes:14075} and have been {Desc; course:15616}. Home blood sugar records: {diabetes glucometry results:16657}  Episodes of hypoglycemia? {yes***/no:17258}   Current insulin regiment: Is not on insulin Most Recent Eye Exam: not UTD Weight trend: {trend:16658} Prior visit with dietician: No {Current exercise:16438:::1} {Current diet habits:16563:::1}  Pertinent Labs:    Component Value Date/Time   CHOL 117 04/28/2019 1522   TRIG 114 04/28/2019 1522   HDL 40 04/28/2019 1522   LDLCALC 54 04/28/2019 1522   CREATININE 1.15 07/07/2019 1228    Wt Readings from Last 3 Encounters:  08/18/19 202 lb 9.6 oz (91.9 kg)  07/10/19 205 lb 0.4 oz (93 kg)  04/28/19 207 lb (93.9 kg)    ------------------------------------------------------------------------  Hypertension, follow-up:  BP Readings from Last 3 Encounters:  08/18/19 115/83  07/10/19 136/90  04/28/19 123/80    He was last seen for hypertension 7 months ago.  BP at that visit was 123/80. Management since that visit includes no changes. He reports {excellent/good/fair/poor:19665} compliance with treatment. He {ACTION; IS/IS NPJA:[id number]}having side effects. *** He {is/is not:9024} exercising. He {is/is not:9024} adherent to low salt diet.   Outside blood pressures are ***. He is  experiencing {Symptoms; cardiac:12860}.  Patient denies {Symptoms; cardiac:12860}.   Cardiovascular risk factors include advanced age (older than 577for men, 636for women), diabetes mellitus, hypertension and male gender.  Use of agents associated with hypertension: none.     Weight trend: {trend:16658} Wt Readings from Last 3 Encounters:  08/18/19 202 lb 9.6 oz (91.9 kg)  07/10/19 205 lb 0.4 oz (93 kg)  04/28/19 207 lb (93.9 kg)    Current diet: {diet habits:16563}  ------------------------------------------------------------------------  Allergies  Allergen Reactions  . No Known Allergies      Current Outpatient Medications:  .  ACCU-CHEK SOFTCLIX LANCETS lancets, CHECK  SUGAR  DAILY, Disp: 100 each, Rfl: 3 .  Alcohol Swabs (B-D SINGLE USE SWABS REGULAR) PADS, CHECK  SUGAR  DAILY, Disp: 100 each, Rfl: 3 .  atorvastatin (LIPITOR) 40 MG tablet, Take 1 tablet (40 mg total) by mouth daily., Disp: 90 tablet, Rfl: 4 .  Blood Glucose Monitoring Suppl (ACCU-CHEK AVIVA PLUS) w/Device KIT, CHECK  SUGAR  DAILY, Disp: 1 kit, Rfl: 0 .  canagliflozin (INVOKANA) 100 MG TABS tablet, Take 1 tablet (100 mg total) by mouth daily before breakfast., Disp: 90 tablet, Rfl: 1 .  cyclobenzaprine (FLEXERIL) 10 MG tablet, TAKE 1 TABLET THREE TIMES DAILY AS NEEDED FOR MUSCLE SPASMS., Disp: 90 tablet, Rfl: 4 .  docusate sodium (COLACE) 100 MG capsule, Take 1 capsule (100 mg total) by mouth 2 (two) times daily. (Patient taking differently: Take 100 mg by mouth 2 (two) times daily as needed for mild constipation. ), Disp: 60 capsule, Rfl: 0 .  glipiZIDE (GLUCOTROL XL) 10 MG 24 hr tablet, Take 1/2 tablet daily,  Disp: , Rfl:  .  glucose blood (ACCU-CHEK AVIVA PLUS) test strip, CHECK  SUGAR  DAILY, Disp: 100 each, Rfl: 3 .  hydrochlorothiazide (HYDRODIURIL) 25 MG tablet, TAKE 1 TABLET (25 MG TOTAL) DAILY, Disp: 90 tablet, Rfl: 3 .  lisinopril (ZESTRIL) 20 MG tablet, TAKE 1 TABLET (20 MG TOTAL) DAILY, Disp: 90  tablet, Rfl: 3 .  metFORMIN (GLUCOPHAGE) 850 MG tablet, Take 1 tablet (850 mg total) by mouth 2 (two) times daily with a meal., Disp: 180 tablet, Rfl: 3 .  Multiple Vitamin (MULTIVITAMIN WITH MINERALS) TABS tablet, Take 1 tablet by mouth daily., Disp: , Rfl:  .  Omega-3 Fatty Acids (OMEGA 3 500 PO), Take 500 mg by mouth daily., Disp: , Rfl:  .  omeprazole (PRILOSEC) 40 MG capsule, Take 1 capsule (40 mg total) by mouth daily as needed (for heartburn/indigestion.)., Disp: 90 capsule, Rfl: 3 .  pioglitazone (ACTOS) 30 MG tablet, Take 1 tablet (30 mg total) by mouth daily., Disp: 90 tablet, Rfl: 4 .  tadalafil (CIALIS) 20 MG tablet, Take 20 mg by mouth daily as needed for erectile dysfunction. Reported on 04/23/2016, Disp: , Rfl:   Review of Systems  Constitutional: Negative for appetite change, chills and fever.  Respiratory: Negative for chest tightness, shortness of breath and wheezing.   Cardiovascular: Negative for chest pain and palpitations.  Gastrointestinal: Negative for abdominal pain, nausea and vomiting.    Social History   Tobacco Use  . Smoking status: Never Smoker  . Smokeless tobacco: Never Used  Substance Use Topics  . Alcohol use: No    Alcohol/week: 0.0 standard drinks      Objective:   There were no vitals taken for this visit. There were no vitals filed for this visit.There is no height or weight on file to calculate BMI.   Physical Exam   No results found for any visits on 11/23/19.     Assessment & Plan        Lelon Huh, MD  [facility]

## 2019-11-27 ENCOUNTER — Telehealth: Payer: Self-pay | Admitting: Family Medicine

## 2019-11-27 NOTE — Telephone Encounter (Signed)
Please advise patient his insurance does not cover Invokana this year, but does cover [place] and [place] which work the same way. Will be sending  prescription for Farxiga to [facility] for him to take in place of Invokana.

## 2019-11-27 NOTE — Telephone Encounter (Signed)
Patient advised.

## 2019-12-08 ENCOUNTER — Other Ambulatory Visit: Payer: Self-pay | Admitting: Family Medicine

## 2019-12-08 MED ORDER — FARXIGA 5 MG PO TABS: 5.0000 mg | ORAL_TABLET | ORAL | 4 refills | Status: DC

## 2019-12-08 NOTE — Progress Notes (Signed)
Change Invokana to farxiga due to insurance formulary

## 2019-12-15 ENCOUNTER — Other Ambulatory Visit: Payer: Self-pay

## 2019-12-15 ENCOUNTER — Encounter: Payer: Self-pay | Admitting: Family Medicine

## 2019-12-15 ENCOUNTER — Ambulatory Visit (INDEPENDENT_AMBULATORY_CARE_PROVIDER_SITE_OTHER): Payer: Medicare Other | Admitting: Family Medicine

## 2019-12-15 VITALS — BP 130/96 | HR 110 | Temp 96.8°F | Resp 16 | Wt 208.0 lb

## 2019-12-15 DIAGNOSIS — I10 Essential (primary) hypertension: Secondary | ICD-10-CM | POA: Diagnosis not present

## 2019-12-15 DIAGNOSIS — E119 Type 2 diabetes mellitus without complications: Secondary | ICD-10-CM

## 2019-12-15 LAB — POCT GLYCOSYLATED HEMOGLOBIN (HGB A1C)
Est. average glucose Bld gHb Est-mCnc: 189
Hemoglobin A1C: 8.2 % — AB (ref 4.0–5.6)

## 2019-12-15 NOTE — Progress Notes (Signed)
Patient: Connor [name] Male    DOB: 01-15-1961   59 y.o.   MRN: [id number] Visit Date: 12/15/2019  Today's Provider: Lelon Huh, MD   Chief Complaint  Patient presents with  . Diabetes  . Hypertension   Subjective:     HPI  Diabetes Mellitus Type II, Follow-up:   Lab Results  Component Value Date   HGBA1C 8.2 (A) 12/15/2019   HGBA1C 6.6 (A) 08/18/2019   HGBA1C 10.5 (A) 04/28/2019    Last seen for diabetes 3 months ago.  Management since then includes decreasing Glipizide to 1/2 tablet daily after dramatic improvement in A1c after adding Invokana. However he states the Invokana was a little too strong so he has only been taking 1/2 tablet daily.   His insurance formulary has changed and we recently sent prescription to change Invokana to [place], but he has not received prescription yet.  He reports good compliance with treatment. He is not having side effects.  Current symptoms include none and have been stable. Home blood sugar records: 110-120  Episodes of hypoglycemia? no   Current insulin regiment: Is not on insulin Most Recent Eye Exam: not UTD Weight trend: fluctuating a bit Prior visit with dietician: No Current exercise: none Current diet habits: well balanced  Pertinent Labs:    Component Value Date/Time   CHOL 117 04/28/2019 1522   TRIG 114 04/28/2019 1522   HDL 40 04/28/2019 1522   LDLCALC 54 04/28/2019 1522   CREATININE 1.15 07/07/2019 1228    Wt Readings from Last 3 Encounters:  12/15/19 208 lb (94.3 kg)  08/18/19 202 lb 9.6 oz (91.9 kg)  07/10/19 205 lb 0.4 oz (93 kg)    ------------------------------------------------------------------------  Hypertension, follow-up:  BP Readings from Last 3 Encounters:  12/15/19 (!) 130/96  08/18/19 115/83  07/10/19 136/90    He was last seen for hypertension 6 months ago.  BP at that visit was 123/80. Management since that visit includes no changes. He reports good compliance with  treatment. He is not having side effects.  He is not exercising. He is not adherent to low salt diet.   Outside blood pressures are not checked. He is experiencing none.  Patient denies chest pain, chest pressure/discomfort, claudication, dyspnea, exertional chest pressure/discomfort, fatigue, irregular heart beat, lower extremity edema, near-syncope, orthopnea, palpitations, paroxysmal nocturnal dyspnea, syncope and tachypnea.   Cardiovascular risk factors include advanced age (older than 98 for men, 11 for women), diabetes mellitus, hypertension and male gender.  Use of agents associated with hypertension: none.     Weight trend: fluctuating a bit Wt Readings from Last 3 Encounters:  12/15/19 208 lb (94.3 kg)  08/18/19 202 lb 9.6 oz (91.9 kg)  07/10/19 205 lb 0.4 oz (93 kg)    Current diet: well balanced  ------------------------------------------------------------------------  Allergies  Allergen Reactions  . No Known Allergies      Current Outpatient Medications:  .  ACCU-CHEK SOFTCLIX LANCETS lancets, CHECK  SUGAR  DAILY, Disp: 100 each, Rfl: 3 .  Alcohol Swabs (B-D SINGLE USE SWABS REGULAR) PADS, CHECK  SUGAR  DAILY, Disp: 100 each, Rfl: 3 .  atorvastatin (LIPITOR) 40 MG tablet, Take 1 tablet (40 mg total) by mouth daily., Disp: 90 tablet, Rfl: 4 .  Blood Glucose Monitoring Suppl (ACCU-CHEK AVIVA PLUS) w/Device KIT, CHECK  SUGAR  DAILY, Disp: 1 kit, Rfl: 0 .  cyclobenzaprine (FLEXERIL) 10 MG tablet, TAKE 1 TABLET THREE TIMES DAILY AS NEEDED FOR MUSCLE SPASMS.,  Disp: 90 tablet, Rfl: 4 .  Invokana 19m 1/2 tablet daily .  docusate sodium (COLACE) 100 MG capsule, Take 1 capsule (100 mg total) by mouth 2 (two) times daily. (Patient taking differently: Take 100 mg by mouth 2 (two) times daily as needed for mild constipation. ), Disp: 60 capsule, Rfl: 0 .  glipiZIDE (GLUCOTROL XL) 10 MG 24 hr tablet, Take 1/2 tablet daily, Disp: , Rfl:  .  glucose blood (ACCU-CHEK AVIVA PLUS)  test strip, CHECK  SUGAR  DAILY, Disp: 100 each, Rfl: 3 .  hydrochlorothiazide (HYDRODIURIL) 25 MG tablet, TAKE 1 TABLET (25 MG TOTAL) DAILY, Disp: 90 tablet, Rfl: 3 .  lisinopril (ZESTRIL) 20 MG tablet, TAKE 1 TABLET (20 MG TOTAL) DAILY, Disp: 90 tablet, Rfl: 3 .  metFORMIN (GLUCOPHAGE) 850 MG tablet, Take 1 tablet (850 mg total) by mouth 2 (two) times daily with a meal., Disp: 180 tablet, Rfl: 3 .  Multiple Vitamin (MULTIVITAMIN WITH MINERALS) TABS tablet, Take 1 tablet by mouth daily., Disp: , Rfl:  .  Omega-3 Fatty Acids (OMEGA 3 500 PO), Take 500 mg by mouth daily., Disp: , Rfl:  .  omeprazole (PRILOSEC) 40 MG capsule, Take 1 capsule (40 mg total) by mouth daily as needed (for heartburn/indigestion.)., Disp: 90 capsule, Rfl: 3 .  pioglitazone (ACTOS) 30 MG tablet, Take 1 tablet (30 mg total) by mouth daily., Disp: 90 tablet, Rfl: 4 .  tadalafil (CIALIS) 20 MG tablet, Take 20 mg by mouth daily as needed for erectile dysfunction. Reported on 04/23/2016, Disp: , Rfl:   Review of Systems  Constitutional: Negative for appetite change, chills and fever.  Respiratory: Negative for chest tightness, shortness of breath and wheezing.   Cardiovascular: Negative for chest pain and palpitations.  Gastrointestinal: Negative for abdominal pain, nausea and vomiting.    Social History   Tobacco Use  . Smoking status: Never Smoker  . Smokeless tobacco: Never Used  Substance Use Topics  . Alcohol use: No    Alcohol/week: 0.0 standard drinks      Objective:   BP (!) 130/96 (BP Location: Left Arm, Cuff Size: Normal)   Pulse (!) 110   Temp (!) 96.8 F (36 C) (Temporal)   Resp 16   Wt 208 lb (94.3 kg)   SpO2 99% Comment: room air  BMI 30.72 kg/m  Vitals:   12/15/19 1336 12/15/19 1342  BP: (!) 136/92 (!) 130/96  Pulse: (!) 110   Resp: 16   Temp: (!) 96.8 F (36 C)   TempSrc: Temporal   SpO2: 99%   Weight: 208 lb (94.3 kg)   Body mass index is 30.72 kg/m.   Physical Exam   General  Appearance:    Overweight male in no acute distress  Eyes:    PERRL, conjunctiva/corneas clear, EOM's intact       Lungs:     Clear to auscultation bilaterally, respirations unlabored  Heart:    Tachycardic. Normal rhythm. No murmurs, rubs, or gallops.   MS:   All extremities are intact.   Neurologic:   Awake, alert, oriented x 3. No apparent focal neurological           defect.        Results for orders placed or performed in visit on 12/15/19  POCT HgB A1C  Result Value Ref Range   Hemoglobin A1C 8.2 (A) 4.0 - 5.6 %   Est. average glucose Bld gHb Est-mCnc 189        Assessment & Plan  1. Diabetes mellitus without complication ([place]) Had done well on Invokana, but has cut down to 1/2 tablet daily for unclear reasons. He is being changed to [place] due to insurance formulary. If electrolytes stable will return in 3 months to check a1c.  - Renal function panel  2. Essential hypertension Expect some improvement with change from 1/2 tablet of Invokana 100 to 1 full tablet of Farxiga 10m.      DLelon Huh MD  [facility]

## 2019-12-15 NOTE — Patient Instructions (Signed)
.   Please review the attached list of medications and notify my office if there are any errors.   . Please bring all of your medications to every appointment so we can make sure that our medication list is the same as yours.   . Please contact your eyecare professional to schedule a routine eye exam    

## 2019-12-16 LAB — RENAL FUNCTION PANEL
Albumin: 5 g/dL — ABNORMAL HIGH (ref 3.8–4.9)
BUN/Creatinine Ratio: 22 — ABNORMAL HIGH (ref 9–20)
BUN: 21 mg/dL (ref 6–24)
CO2: 21 mmol/L (ref 20–29)
Calcium: 9.8 mg/dL (ref 8.7–10.2)
Chloride: 102 mmol/L (ref 96–106)
Creatinine, Ser: 0.97 mg/dL (ref 0.76–1.27)
GFR calc Af Amer: 100 mL/min/{1.73_m2} (ref >59)
GFR calc non Af Amer: 86 mL/min/{1.73_m2} (ref >59)
Glucose: 180 mg/dL — ABNORMAL HIGH (ref 65–99)
Phosphorus: 4.1 mg/dL (ref 2.8–4.1)
Potassium: 4.4 mmol/L (ref 3.5–5.2)
Sodium: 140 mmol/L (ref 134–144)

## 2020-02-29 DIAGNOSIS — E119 Type 2 diabetes mellitus without complications: Secondary | ICD-10-CM | POA: Diagnosis not present

## 2020-02-29 LAB — HM DIABETES EYE EXAM

## 2020-03-14 NOTE — Progress Notes (Signed)
Subjective:   Connor [name] is a 59 y.o. male who presents for an Initial Medicare Annual Wellness Visit.    This visit is being conducted through telemedicine due to the COVID-19 pandemic. This patient has given me verbal consent via doximity to conduct this visit, patient states they are participating from their home address. Some vital signs may be absent or patient reported.    Patient identification: identified by name, DOB, and current address  Review of Systems  N/A  Cardiac Risk Factors include: advanced age (>52mn, >>110women);diabetes mellitus;dyslipidemia;male gender;hypertension    Objective:    Today's Vitals   03/15/20 1020  PainSc: 0-No pain   There is no height or weight on file to calculate BMI. Unable to obtain vitals due to visit being conducted via telephonically.   Advanced Directives 03/15/2020 07/10/2019 07/06/2019 01/13/2018 01/07/2018 02/22/2017 01/11/2017  Does Patient Have a Medical Advance Directive? No No No No No No No  Would patient like information on creating a medical advance directive? No - Patient declined No - Patient declined No - Patient declined No - Patient declined No - Patient declined No - Patient declined No - Patient declined    Current Medications (verified) Outpatient Encounter Medications as of 03/15/2020  Medication Sig  . ACCU-CHEK SOFTCLIX LANCETS lancets CHECK  SUGAR  DAILY  . Alcohol Swabs (B-D SINGLE USE SWABS REGULAR) PADS CHECK  SUGAR  DAILY  . atorvastatin (LIPITOR) 40 MG tablet Take 1 tablet (40 mg total) by mouth daily.  . Blood Glucose Monitoring Suppl (ACCU-CHEK AVIVA PLUS) w/Device KIT CHECK  SUGAR  DAILY  . cyclobenzaprine (FLEXERIL) 10 MG tablet TAKE 1 TABLET THREE TIMES DAILY AS NEEDED FOR MUSCLE SPASMS.  . dapagliflozin propanediol (FARXIGA) 5 MG TABS tablet Take 5 mg by mouth every morning. TAKE IN PLACE OF INVOKANA  . docusate sodium (COLACE) 100 MG capsule Take 1 capsule (100 mg total) by mouth 2 (two) times  daily. (Patient taking differently: Take 100 mg by mouth 2 (two) times daily as needed for mild constipation. )  . glipiZIDE (GLUCOTROL XL) 10 MG 24 hr tablet Take 1/2 tablet daily  . glucose blood (ACCU-CHEK AVIVA PLUS) test strip CHECK  SUGAR  DAILY  . hydrochlorothiazide (HYDRODIURIL) 25 MG tablet TAKE 1 TABLET (25 MG TOTAL) DAILY  . lisinopril (ZESTRIL) 20 MG tablet TAKE 1 TABLET (20 MG TOTAL) DAILY  . metFORMIN (GLUCOPHAGE) 850 MG tablet TAKE 1 TABLET TWICE DAILY WITH MEALS  . Multiple Vitamin (MULTIVITAMIN WITH MINERALS) TABS tablet Take 1 tablet by mouth daily.  . Omega-3 Fatty Acids (OMEGA 3 500 PO) Take 500 mg by mouth daily.  .Marland Kitchenomeprazole (PRILOSEC) 40 MG capsule Take 1 capsule (40 mg total) by mouth daily as needed (for heartburn/indigestion.).  .Marland Kitchenpioglitazone (ACTOS) 30 MG tablet Take 1 tablet (30 mg total) by mouth daily.  . tadalafil (CIALIS) 20 MG tablet Take 20 mg by mouth daily as needed for erectile dysfunction. Reported on 04/23/2016  . [DISCONTINUED] metFORMIN (GLUCOPHAGE) 850 MG tablet Take 1 tablet (850 mg total) by mouth 2 (two) times daily with a meal.   No facility-administered encounter medications on file as of 03/15/2020.    Allergies (verified) No known allergies   History: Past Medical History:  Diagnosis Date  . Arthritis    cervical spondylosis, myelopathy   . GERD (gastroesophageal reflux disease)   . Hyperlipidemia   . Hypertension   . Lazy eye, left    Dr YAnnamaria Boots GEdison Simon  Past Surgical History:  Procedure Laterality Date  . ANTERIOR CERVICAL DECOMPRESSION/DISCECTOMY FUSION 4 LEVELS N/A 01/21/2017   Procedure: ANTERIOR CERVICAL DECOMPRESSION/DISCECTOMY FUSION , INTERBODY PROSTHESIS,PLATE CERVICAL THREE- CERVICAL FOUR,CERVICAL FOUR- CERVICAL FIVE, CERVICAL FIVE- CERVICAL SIX, CERVICAL SIX- CERVICAL SEVEN;  Surgeon: Newman Pies, MD;  Location: [place];  Service: Neurosurgery;  Laterality: N/A;  . CARPAL TUNNEL RELEASE Right 01/13/2018   Procedure: CARPAL  TUNNEL RELEASE RIGHT;  Surgeon: Newman Pies, MD;  Location: [place];  Service: Neurosurgery;  Laterality: Right;  . COLONOSCOPY WITH PROPOFOL N/A 08/30/2015   Procedure: COLONOSCOPY WITH PROPOFOL;  Surgeon: Lucilla Lame, MD;  Location: [facility];  Service: Endoscopy;  Laterality: N/A;  . STRABISMUS SURGERY Bilateral 07/10/2019   Procedure: STRABISMUS REPAIR BOTH EYES;  Surgeon: Everitt Amber, MD;  Location: [place];  Service: Ophthalmology;  Laterality: Bilateral;   Family History  Problem Relation Age of Onset  . Diabetes Mother   . Hyperlipidemia Mother   . Hypertension Mother   . Diabetes Father   . Hypertension Father   . Hyperlipidemia Father    Social History   Socioeconomic History  . Marital status: Married    Spouse name: Not on file  . Number of children: 2  . Years of education: Not on file  . Highest education level: High school graduate  Occupational History  . Occupation: unemployed  Tobacco Use  . Smoking status: Never Smoker  . Smokeless tobacco: Never Used  Substance and Sexual Activity  . Alcohol use: No    Alcohol/week: 0.0 standard drinks  . Drug use: No  . Sexual activity: Not on file  Other Topics Concern  . Not on file  Social History Narrative  . Not on file   Social Determinants of Health   Financial Resource Strain: Low Risk   . Difficulty of Paying Living Expenses: Not hard at all  Food Insecurity: No Food Insecurity  . Worried About Charity fundraiser in the Last Year: Never true  . Ran Out of Food in the Last Year: Never true  Transportation Needs: No Transportation Needs  . Lack of Transportation (Medical): No  . Lack of Transportation (Non-Medical): No  Physical Activity: Inactive  . Days of Exercise per Week: 0 days  . Minutes of Exercise per Session: 0 min  Stress: No Stress Concern Present  . Feeling of Stress : Not at all  Social Connections: Slightly Isolated  . Frequency of Communication with Friends  and Family: More than three times a week  . Frequency of Social Gatherings with Friends and Family: Once a week  . Attends Religious Services: More than 4 times per year  . Active Member of Clubs or Organizations: No  . Attends Archivist Meetings: Never  . Marital Status: Married   Tobacco Counseling Counseling given: Not Answered   Clinical Intake:  Pre-visit preparation completed: Yes  Pain : No/denies pain Pain Score: 0-No pain     Nutritional Risks: None Diabetes: Yes  How often do you need to have someone help you when you read instructions, pamphlets, or other written materials from your doctor or pharmacy?: 1 - Never   Diabetes:  Is the patient diabetic?  Yes  If diabetic, was a CBG obtained today?  No  Did the patient bring in their glucometer from home?  No  How often do you monitor your CBG's? Twice daily.   Financial Strains and Diabetes Management:  Are you having any financial strains with the device,  your supplies or your medication? No .  Does the patient want to be seen by Chronic Care Management for management of their diabetes?  No  Would the patient like to be referred to a Nutritionist or for Diabetic Management?  No   Diabetic Exams:  Diabetic Eye Exam: Completed 03/09/20. Repeat yearly.  Diabetic Foot Exam: Currently due. Pt has been advised about the importance in completing this exam. Note made to follow up on this at today's in office apt.    Interpreter Needed?: No  Information entered by :: [facility], LPN  Activities of Daily Living In your present state of health, do you have any difficulty performing the following activities: 03/15/2020 07/10/2019  Hearing? N N  Vision? N N  Difficulty concentrating or making decisions? N N  Walking or climbing stairs? N N  Dressing or bathing? N N  Doing errands, shopping? N -  Preparing Food and eating ? N -  Using the Toilet? N -  In the past six months, have you accidently leaked  urine? N -  Do you have problems with loss of bowel control? N -  Managing your Medications? N -  Managing your Finances? N -  Housekeeping or managing your Housekeeping? N -  Some recent data might be hidden     Immunizations and Health Maintenance Immunization History  Administered Date(s) Administered  . Influenza,inj,Quad PF,6+ Mos 08/10/2016, 08/18/2017, 09/05/2018, 08/18/2019  . Influenza-Unspecified 09/19/2017  . Tdap 04/16/2014   Health Maintenance Due  Topic Date Due  . PNEUMOCOCCAL POLYSACCHARIDE VACCINE AGE 37-64 HIGH RISK  Never done  . FOOT EXAM  Never done  . COVID-19 Vaccine (1) Never done  . COLONOSCOPY  08/30/2019    Patient Care Team: Birdie Sons, MD as PCP - General (Family Medicine) Everitt Amber, MD as Consulting Physician (Ophthalmology)  Indicate any recent Medical Services you may have received from other than [facility] providers in the past year (date may be approximate).    Assessment:   This is a routine wellness examination for Zebulon.  Hearing/Vision screen No exam data present  Dietary issues and exercise activities discussed: Current Exercise Habits: The patient does not participate in regular exercise at present, Exercise limited by: None identified  Goals    . Exercise 3x per week (30 min per time)     Recommend to exercise for 3 days a week for at least 30 minutes at a time.       Depression Screen PHQ 2/9 Scores 03/15/2020 12/15/2019 03/18/2018  PHQ - 2 Score 0 0 0    Fall Risk Fall Risk  03/15/2020 03/18/2018  Falls in the past year? 0 No  Number falls in past yr: 0 -  Injury with Fall? 0 -    FALL RISK PREVENTION PERTAINING TO THE HOME:  Any stairs in or around the home? No  If so, are there any without handrails? Yes   Home free of loose throw rugs in walkways, pet beds, electrical cords, etc? Yes  Adequate lighting in your home to reduce risk of falls? Yes   ASSISTIVE DEVICES UTILIZED TO PREVENT FALLS:  Life alert? No    Use of a cane, walker or w/c? No  Grab bars in the bathroom? No  Shower chair or bench in shower? No  Elevated toilet seat or a handicapped toilet? No    TIMED UP AND GO:  Was the test performed? No .    Cognitive Function: Declined today.  Screening Tests Health Maintenance  Topic Date Due  . PNEUMOCOCCAL POLYSACCHARIDE VACCINE AGE 75-64 HIGH RISK  Never done  . FOOT EXAM  Never done  . COVID-19 Vaccine (1) Never done  . COLONOSCOPY  08/30/2019  . HEMOGLOBIN A1C  06/13/2020  . INFLUENZA VACCINE  06/19/2020  . OPHTHALMOLOGY EXAM  02/28/2021  . TETANUS/TDAP  04/16/2024  . Hepatitis C Screening  Completed  . HIV Screening  Completed    Tdap: Up to date  Flu Vaccine: Up to date   Cancer Screenings:  Colorectal Screening: Completed 08/30/15. Repeat every 4 years. Declined referral at this time. Pt to speak with PCP at apt this afternoon.   Lung Cancer Screening: (Low Dose CT Chest recommended if Age 8-80 years, 30 pack-year currently smoking OR have quit w/in 15years.) does not qualify.   Additional Screening:  Hepatitis C Screening: Up to date  Dental Screening: Recommended annual dental exams for proper oral hygiene  Community Resource Referral:  CRR required this visit?  No        Plan:  I have personally reviewed and addressed the Medicare Annual Wellness questionnaire and have noted the following in the patient's chart:  A. Medical and social history B. Use of alcohol, tobacco or illicit drugs  C. Current medications and supplements D. Functional ability and status E.  Nutritional status F.  Physical activity G. Advance directives H. List of other physicians I.  Hospitalizations, surgeries, and ER visits in previous 12 months J.  [place] such as hearing and vision if needed, cognitive and depression L. Referrals and appointments   In addition, I have reviewed and discussed with patient certain preventive protocols, quality  metrics, and best practice recommendations. A written personalized care plan for preventive services as well as general preventive health recommendations were provided to patient.   Glendora Score, [place]   07/20/7240  Nurse Health Advisor    Nurse Notes: Pt needs a diabetic foot exam at today's in office apt. Pt declined colonoscopy referral but would like to speak to PCP about this further at apt this afternoon.

## 2020-03-15 ENCOUNTER — Ambulatory Visit (INDEPENDENT_AMBULATORY_CARE_PROVIDER_SITE_OTHER): Payer: Medicare HMO | Admitting: Family Medicine

## 2020-03-15 ENCOUNTER — Other Ambulatory Visit: Payer: Self-pay

## 2020-03-15 ENCOUNTER — Ambulatory Visit (INDEPENDENT_AMBULATORY_CARE_PROVIDER_SITE_OTHER): Payer: Medicare HMO

## 2020-03-15 ENCOUNTER — Other Ambulatory Visit: Payer: Self-pay | Admitting: Family Medicine

## 2020-03-15 ENCOUNTER — Encounter: Payer: Self-pay | Admitting: Family Medicine

## 2020-03-15 VITALS — BP 130/92 | HR 101 | Temp 97.3°F | Resp 16 | Wt 205.0 lb

## 2020-03-15 DIAGNOSIS — Z0000 Encounter for general adult medical examination without abnormal findings: Secondary | ICD-10-CM | POA: Diagnosis not present

## 2020-03-15 DIAGNOSIS — I10 Essential (primary) hypertension: Secondary | ICD-10-CM

## 2020-03-15 DIAGNOSIS — E119 Type 2 diabetes mellitus without complications: Secondary | ICD-10-CM | POA: Diagnosis not present

## 2020-03-15 DIAGNOSIS — Z86010 Personal history of colonic polyps: Secondary | ICD-10-CM | POA: Diagnosis not present

## 2020-03-15 LAB — POCT GLYCOSYLATED HEMOGLOBIN (HGB A1C)
Est. average glucose Bld gHb Est-mCnc: 169
Hemoglobin A1C: 7.5 % — AB (ref 4.0–5.6)

## 2020-03-15 MED ORDER — FARXIGA 5 MG PO TABS: 5.0000 mg | ORAL_TABLET | ORAL | 4 refills | Status: DC

## 2020-03-15 NOTE — Telephone Encounter (Signed)
Requested Prescriptions  Pending Prescriptions Disp Refills  . metFORMIN (GLUCOPHAGE) 850 MG tablet [Pharmacy Med Name: METFORMIN HYDROCHLORIDE 850 MG Tablet] 180 tablet 0    Sig: TAKE 1 TABLET TWICE DAILY WITH MEALS     Endocrinology:  Diabetes - Biguanides Failed - 03/15/2020  2:11 AM      Failed - HBA1C is between 0 and 7.9 and within 180 days    Hemoglobin A1C  Date Value Ref Range Status  12/15/2019 8.2 (A) 4.0 - 5.6 % Final   Hgb A1c MFr Bld  Date Value Ref Range Status  12/13/2016 8.6 (H) 4.8 - 5.6 % Final    Comment:    (NOTE)         Pre-diabetes: 5.7 - 6.4         Diabetes: >6.4         Glycemic control for adults with diabetes: <7.0          Passed - Cr in normal range and within 360 days    Creatinine, Ser  Date Value Ref Range Status  12/15/2019 0.97 0.76 - 1.27 mg/dL Final         Passed - eGFR in normal range and within 360 days    GFR calc Af Amer  Date Value Ref Range Status  12/15/2019 100 >59 mL/min/1.73 Final   GFR calc non Af Amer  Date Value Ref Range Status  12/15/2019 86 >59 mL/min/1.73 Final         Passed - Valid encounter within last 6 months    Recent Outpatient Visits          3 months ago Diabetes mellitus without complication [facility])   [facility] Birdie Sons, MD   7 months ago Diabetes mellitus without complication [facility])   [facility] Birdie Sons, MD   10 months ago Essential hypertension   [facility] Birdie Sons, MD   1 year ago STD exposure   [place], [place], [place]   1 year ago Diabetes mellitus without complication [facility])   [facility] [place], [place], [place]      Future Appointments            Today Fisher, Kirstie Peri, MD [facility]

## 2020-03-15 NOTE — Patient Instructions (Signed)
.   Please review the attached list of medications and notify my office if there are any errors.   . Please bring all of your medications to every appointment so we can make sure that our medication list is the same as yours.   

## 2020-03-15 NOTE — Patient Instructions (Signed)
[name] , Thank you for taking time to come for your Medicare Wellness Visit. I appreciate your ongoing commitment to your health goals. Please review the following plan we discussed and let me know if I can assist you in the future.   Screening recommendations/referrals: Colonoscopy: Currently due. Will speak to PCP about this at today's in office apt. Recommended yearly ophthalmology/optometry visit for glaucoma screening and checkup Recommended yearly dental visit for hygiene and checkup  Vaccinations: Influenza vaccine: Up to date Tdap vaccine: Up to date    Advanced directives: Pt declines today.   Conditions/risks identified: Recommend to exercise for 3 days a week for at least 30 minutes at a time.   Next appointment: 1:40 PM today with Dr Sherrie Mustache  Preventive Care 40-64 Years, Male Preventive care refers to lifestyle choices and visits with your health care provider that can promote health and wellness. What does preventive care include?  A yearly physical exam. This is also called an annual well check.  Dental exams once or twice a year.  Routine eye exams. Ask your health care provider how often you should have your eyes checked.  Personal lifestyle choices, including:  Daily care of your teeth and gums.  Regular physical activity.  Eating a healthy diet.  Avoiding tobacco and drug use.  Limiting alcohol use.  Practicing safe sex.  Taking low-dose aspirin every day starting at age 33. What happens during an annual well check? The services and screenings done by your health care provider during your annual well check will depend on your age, overall health, lifestyle risk factors, and family history of disease. Counseling  Your health care provider may ask you questions about your:  Alcohol use.  Tobacco use.  Drug use.  Emotional well-being.  Home and relationship well-being.  Sexual activity.  Eating habits.  Work and work  Astronomer. Screening  You may have the following tests or measurements:  Height, weight, and BMI.  Blood pressure.  Lipid and cholesterol levels. These may be checked every 5 years, or more frequently if you are over 71 years old.  Skin check.  Lung cancer screening. You may have this screening every year starting at age 36 if you have a 30-pack-year history of smoking and currently smoke or have quit within the past 15 years.  Fecal occult blood test (FOBT) of the stool. You may have this test every year starting at age 22.  Flexible sigmoidoscopy or colonoscopy. You may have a sigmoidoscopy every 5 years or a colonoscopy every 10 years starting at age 12.  Prostate cancer screening. Recommendations will vary depending on your family history and other risks.  Hepatitis C blood test.  Hepatitis B blood test.  Sexually transmitted disease (STD) testing.  Diabetes screening. This is done by checking your blood sugar (glucose) after you have not eaten for a while (fasting). You may have this done every 1-3 years. Discuss your test results, treatment options, and if necessary, the need for more tests with your health care provider. Vaccines  Your health care provider may recommend certain vaccines, such as:  Influenza vaccine. This is recommended every year.  Tetanus, diphtheria, and acellular pertussis (Tdap, Td) vaccine. You may need a Td booster every 10 years.  Zoster vaccine. You may need this after age 27.  Pneumococcal 13-valent conjugate (PCV13) vaccine. You may need this if you have certain conditions and have not been vaccinated.  Pneumococcal polysaccharide (PPSV23) vaccine. You may need one or two doses if  you smoke cigarettes or if you have certain conditions. Talk to your health care provider about which screenings and vaccines you need and how often you need them. This information is not intended to replace advice given to you by your health care provider. Make  sure you discuss any questions you have with your health care provider. Document Released: 12/02/2015 Document Revised: 07/25/2016 Document Reviewed: 09/06/2015 Elsevier Interactive Patient Education  2017 [place] Prevention in the Home Falls can cause injuries. They can happen to people of all ages. There are many things you can do to make your home safe and to help prevent falls. What can I do on the outside of my home?  Regularly fix the edges of walkways and driveways and fix any cracks.  Remove anything that might make you trip as you walk through a door, such as a raised step or threshold.  Trim any bushes or trees on the path to your home.  Use bright outdoor lighting.  Clear any walking paths of anything that might make someone trip, such as rocks or tools.  Regularly check to see if handrails are loose or broken. Make sure that both sides of any steps have handrails.  Any raised decks and porches should have guardrails on the edges.  Have any leaves, snow, or ice cleared regularly.  Use sand or salt on walking paths during winter.  Clean up any spills in your garage right away. This includes oil or grease spills. What can I do in the bathroom?  Use night lights.  Install grab bars by the toilet and in the tub and shower. Do not use towel bars as grab bars.  Use non-skid mats or decals in the tub or shower.  If you need to sit down in the shower, use a plastic, non-slip stool.  Keep the floor dry. Clean up any water that spills on the floor as soon as it happens.  Remove soap buildup in the tub or shower regularly.  Attach bath mats securely with double-sided non-slip rug tape.  Do not have throw rugs and other things on the floor that can make you trip. What can I do in the bedroom?  Use night lights.  Make sure that you have a light by your bed that is easy to reach.  Do not use any sheets or blankets that are too big for your bed. They should  not hang down onto the floor.  Have a firm chair that has side arms. You can use this for support while you get dressed.  Do not have throw rugs and other things on the floor that can make you trip. What can I do in the kitchen?  Clean up any spills right away.  Avoid walking on wet floors.  Keep items that you use a lot in easy-to-reach places.  If you need to reach something above you, use a strong step stool that has a grab bar.  Keep electrical cords out of the way.  Do not use floor polish or wax that makes floors slippery. If you must use wax, use non-skid floor wax.  Do not have throw rugs and other things on the floor that can make you trip. What can I do with my stairs?  Do not leave any items on the stairs.  Make sure that there are handrails on both sides of the stairs and use them. Fix handrails that are broken or loose. Make sure that handrails are as long as  the stairways.  Check any carpeting to make sure that it is firmly attached to the stairs. Fix any carpet that is loose or worn.  Avoid having throw rugs at the top or bottom of the stairs. If you do have throw rugs, attach them to the floor with carpet tape.  Make sure that you have a light switch at the top of the stairs and the bottom of the stairs. If you do not have them, ask someone to add them for you. What else can I do to help prevent falls?  Wear shoes that:  Do not have high heels.  Have rubber bottoms.  Are comfortable and fit you well.  Are closed at the toe. Do not wear sandals.  If you use a stepladder:  Make sure that it is fully opened. Do not climb a closed stepladder.  Make sure that both sides of the stepladder are locked into place.  Ask someone to hold it for you, if possible.  Clearly mark and make sure that you can see:  Any grab bars or handrails.  First and last steps.  Where the edge of each step is.  Use tools that help you move around (mobility aids) if they are  needed. These include:  Canes.  Walkers.  Scooters.  Crutches.  Turn on the lights when you go into a dark area. Replace any light bulbs as soon as they burn out.  Set up your furniture so you have a clear path. Avoid moving your furniture around.  If any of your floors are uneven, fix them.  If there are any pets around you, be aware of where they are.  Review your medicines with your doctor. Some medicines can make you feel dizzy. This can increase your chance of falling. Ask your doctor what other things that you can do to help prevent falls. This information is not intended to replace advice given to you by your health care provider. Make sure you discuss any questions you have with your health care provider. Document Released: 09/01/2009 Document Revised: 04/12/2016 Document Reviewed: 12/10/2014 Elsevier Interactive Patient Education  2017 [employer].

## 2020-03-15 NOTE — Progress Notes (Signed)
Established patient visit   Patient: Connor [name]   DOB: 09/21/1961   59 y.o. Male  MRN: [id number] Visit Date: 03/15/2020  Today's healthcare provider: Lelon Huh, MD   Chief Complaint  Patient presents with  . Diabetes  . Hypertension   Subjective    HPI Diabetes Mellitus Type II, Follow-up  Lab Results  Component Value Date   HGBA1C 7.5 (A) 03/15/2020   HGBA1C 8.2 (A) 12/15/2019   HGBA1C 6.6 (A) 08/18/2019   Last seen for diabetes 3 months ago.  Management since then includes changing from [place] to [place] due to insurance formulary.  He reports good compliance with treatment. Patient has been taking the remaninig pills of Invokana 162m tablets , and has not switched over to FIran  He is not having side effects.  Symptoms: No fatigue No foot ulcerations No appetite changes No nausea No paresthesia (numbness or tingling) of the feet  No polydipsia (excessive thirst) No polyuria (frequent urination) No visual disturbances  No vomiting  Home blood sugar records: fasting range: 120  Episodes of hypoglycemia? No    Current insulin regiment: none Most Recent Eye Exam: 01/2020 Current exercise: none Current diet habits: well balanced  Pertinent Labs: Lab Results  Component Value Date   CHOL 117 04/28/2019   HDL 40 04/28/2019   LDLCALC 54 04/28/2019   TRIG 114 04/28/2019   CHOLHDL 2.9 04/28/2019   Lab Results  Component Value Date   NA 140 12/15/2019   K 4.4 12/15/2019   CO2 21 12/15/2019   GLUCOSE 180 (H) 12/15/2019   BUN 21 12/15/2019   CREATININE 0.97 12/15/2019   CALCIUM 9.8 12/15/2019   GFRNONAA 86 12/15/2019   GFRAA 100 12/15/2019     Wt Readings from Last 3 Encounters:  03/15/20 205 lb (93 kg)  12/15/19 208 lb (94.3 kg)  08/18/19 202 lb 9.6 oz (91.9 kg)    ----------------------------------------------------------------------------------------- Hypertension, follow-up  BP Readings from Last 3 Encounters:  03/15/20 (!)  130/92  12/15/19 (!) 130/96  08/18/19 115/83   Wt Readings from Last 3 Encounters:  03/15/20 205 lb (93 kg)  12/15/19 208 lb (94.3 kg)  08/18/19 202 lb 9.6 oz (91.9 kg)     He was last seen for hypertension 3 months ago.  BP at that visit was 130/96. Management since that visit includes continuing same medication.  He reports good compliance with treatment. He is not having side effects.  He is following a Regular diet. He is not exercising. He does not smoke.  Use of agents associated with hypertension: NSAIDS.   Outside blood pressures are checked occasionally.  Symptoms:  YES NO    '[]'    '[x]'    Chest Pain   '[]'    '[x]'    Chest pressure/discomfort   '[]'    '[x]'    Palpitations   '[]'    '[x]'    Dyspnea   '[]'    '[x]'    Orthopnea   '[]'    '[x]'    Paroxysmal nocturnal dyspnea   '[]'    '[x]'    Lower extremity edema   '[]'    '[x]'   Syncope     The ASCVD Risk score ([place], et al., 2013) failed to calculate for the following reasons:   The valid total cholesterol range is 130 to 320 mg/dL   --------------------------------------------------------------------------------------------------- Social History   Tobacco Use  . Smoking status: Never Smoker  . Smokeless tobacco: Never Used  Substance Use Topics  . Alcohol use: No  Alcohol/week: 0.0 standard drinks  . Drug use: No       Medications: Outpatient Medications Prior to Visit  Medication Sig  . ACCU-CHEK SOFTCLIX LANCETS lancets CHECK  SUGAR  DAILY  . Alcohol Swabs (B-D SINGLE USE SWABS REGULAR) PADS CHECK  SUGAR  DAILY  . aspirin EC 81 MG tablet Take 81 mg by mouth daily.  Marland Kitchen atorvastatin (LIPITOR) 40 MG tablet Take 1 tablet (40 mg total) by mouth daily.  . Blood Glucose Monitoring Suppl (ACCU-CHEK AVIVA PLUS) w/Device KIT CHECK  SUGAR  DAILY  . cyclobenzaprine (FLEXERIL) 10 MG tablet TAKE 1 TABLET THREE TIMES DAILY AS NEEDED FOR MUSCLE SPASMS.  Marland Kitchen docusate sodium (COLACE) 100 MG capsule Take 1 capsule (100 mg total) by mouth 2  (two) times daily. (Patient taking differently: Take 100 mg by mouth 2 (two) times daily as needed for mild constipation. )  . glipiZIDE (GLUCOTROL XL) 10 MG 24 hr tablet Take 1/2 tablet daily  . glucose blood (ACCU-CHEK AVIVA PLUS) test strip CHECK  SUGAR  DAILY  . hydrochlorothiazide (HYDRODIURIL) 25 MG tablet TAKE 1 TABLET (25 MG TOTAL) DAILY  . lisinopril (ZESTRIL) 20 MG tablet TAKE 1 TABLET (20 MG TOTAL) DAILY  . metFORMIN (GLUCOPHAGE) 850 MG tablet TAKE 1 TABLET TWICE DAILY WITH MEALS  . Multiple Vitamin (MULTIVITAMIN WITH MINERALS) TABS tablet Take 1 tablet by mouth daily.  . Omega-3 Fatty Acids (OMEGA 3 500 PO) Take 500 mg by mouth daily.  Marland Kitchen omeprazole (PRILOSEC) 40 MG capsule Take 1 capsule (40 mg total) by mouth daily as needed (for heartburn/indigestion.).  Marland Kitchen pioglitazone (ACTOS) 30 MG tablet Take 1 tablet (30 mg total) by mouth daily.  . tadalafil (CIALIS) 20 MG tablet Take 20 mg by mouth daily as needed for erectile dysfunction. Reported on 04/23/2016  . dapagliflozin propanediol (FARXIGA) 5 MG TABS tablet Take 5 mg by mouth every morning. TAKE IN PLACE OF INVOKANA (Patient not taking: Reported on 03/15/2020)   No facility-administered medications prior to visit.    Review of Systems  Constitutional: Negative for appetite change, chills and fever.  Respiratory: Negative for chest tightness, shortness of breath and wheezing.   Cardiovascular: Negative for chest pain and palpitations.  Gastrointestinal: Negative for abdominal pain, nausea and vomiting.      Objective    BP (!) 130/92 (BP Location: Right Arm, Cuff Size: Normal)   Pulse (!) 101   Temp (!) 97.3 F (36.3 C) (Temporal)   Resp 16   Wt 205 lb (93 kg)   SpO2 99% Comment: room air  BMI 30.27 kg/m    Physical Exam   General: Appearance:    Obese male in no acute distress  Eyes:    PERRL, conjunctiva/corneas clear, EOM's intact       Lungs:     Clear to auscultation bilaterally, respirations unlabored  Heart:     Tachycardic. Normal rhythm. No murmurs, rubs, or gallops.   MS:   All extremities are intact.   Neurologic:   Awake, alert, oriented x 3. No apparent focal neurological           defect.         Results for orders placed or performed in visit on 03/15/20  POCT HgB A1C  Result Value Ref Range   Hemoglobin A1C 7.5 (A) 4.0 - 5.6 %   Est. average glucose Bld gHb Est-mCnc 169     Assessment & Plan     1. Diabetes mellitus without complication ([place]) Very  well controlled. Continue current medications.   - dapagliflozin propanediol (FARXIGA) 5 MG TABS tablet; Take 5 mg by mouth every morning.  Dispense: 90 tablet; Refill: 4  2. Essential hypertension Well controlled.  Continue current medications.    3. History of adenomatous polyp of colon Due for - Ambulatory referral to Gastroenterology  Return in about 4 months (around 07/15/2020) for Yearly Physical.      The entirety of the information documented in the History of Present Illness, Review of Systems and Physical Exam were personally obtained by me. Portions of this information were initially documented by the CMA and reviewed by me for thoroughness and accuracy.      Lelon Huh, MD  [facility] [phone] (phone) [phone] (fax)  [place]

## 2020-03-22 ENCOUNTER — Other Ambulatory Visit: Payer: Self-pay | Admitting: Family Medicine

## 2020-03-22 MED ORDER — CYCLOBENZAPRINE HCL 10 MG PO TABS: ORAL_TABLET | ORAL | 4 refills | Status: DC

## 2020-03-22 NOTE — Telephone Encounter (Signed)
Requested medication (s) are due for refill today: yes  Requested medication (s) are on the active medication list: yes  Last refill:  11/10/2019  Future visit scheduled: yes  Notes to clinic:  this refill cannot be delegated    Requested Prescriptions  Pending Prescriptions Disp Refills   cyclobenzaprine (FLEXERIL) 10 MG tablet 90 tablet 4    Sig: TAKE 1 TABLET THREE TIMES DAILY AS NEEDED FOR MUSCLE SPASMS.      Not Delegated - Analgesics:  Muscle Relaxants Failed - 03/22/2020  8:47 AM      Failed - This refill cannot be delegated      Passed - Valid encounter within last 6 months    Recent Outpatient Visits           1 week ago Diabetes mellitus without complication [facility])   [facility] Malva Limes, [facility]   3 months ago Diabetes mellitus without complication [facility])   [facility] Malva Limes, MD   7 months ago Diabetes mellitus without complication [facility])   [facility] Malva Limes, MD   10 months ago Essential hypertension   [facility] Malva Limes, MD   1 year ago STD exposure   [facility] [place], [place], [place]

## 2020-03-22 NOTE — Telephone Encounter (Signed)
Medication Refill - Medication: cyclobenzaprine (FLEXERIL) 10 MG tablet    Preferred Pharmacy (with phone number or street name):  [facility] - [place], [place] - [address] Phone:  [phone]  Fax:  [phone]       Agent: Please be advised that RX refills may take up to 3 business days. We ask that you follow-up with your pharmacy.

## 2020-04-06 ENCOUNTER — Ambulatory Visit (INDEPENDENT_AMBULATORY_CARE_PROVIDER_SITE_OTHER): Payer: Medicare HMO | Admitting: Family Medicine

## 2020-04-06 ENCOUNTER — Encounter: Payer: Self-pay | Admitting: Family Medicine

## 2020-04-06 ENCOUNTER — Other Ambulatory Visit: Payer: Self-pay

## 2020-04-06 VITALS — BP 137/93 | HR 95 | Temp 96.9°F | Wt 205.8 lb

## 2020-04-06 DIAGNOSIS — L918 Other hypertrophic disorders of the skin: Secondary | ICD-10-CM | POA: Diagnosis not present

## 2020-04-06 DIAGNOSIS — L989 Disorder of the skin and subcutaneous tissue, unspecified: Secondary | ICD-10-CM | POA: Diagnosis not present

## 2020-04-06 NOTE — Progress Notes (Signed)
Established patient visit   Patient: Connor [name]   DOB: 03/01/1961   59 y.o. Male  MRN: [id number] Visit Date: 04/06/2020  Today's healthcare provider: Wilhemena Durie, MD   Chief Complaint  Patient presents with  . Skin Tag   Mertie Moores as a scribe for Wilhemena Durie, MD.,have documented all relevant documentation on the behalf of Wilhemena Durie, MD,as directed by  Wilhemena Durie, MD while in the presence of Wilhemena Durie, MD.  Subjective    HPI Skin Tag:  Patient presents today C/O a  black colored skin tag on right lower back. Patient reports pain and drainage the past 2 weeks. He denies redness and swelling. Patient is requesting removal.  It is very tender to the touch.      Medications: Outpatient Medications Prior to Visit  Medication Sig  . ACCU-CHEK SOFTCLIX LANCETS lancets CHECK  SUGAR  DAILY  . Alcohol Swabs (B-D SINGLE USE SWABS REGULAR) PADS CHECK  SUGAR  DAILY  . aspirin EC 81 MG tablet Take 81 mg by mouth daily.  Marland Kitchen atorvastatin (LIPITOR) 40 MG tablet Take 1 tablet (40 mg total) by mouth daily.  . Blood Glucose Monitoring Suppl (ACCU-CHEK AVIVA PLUS) w/Device KIT CHECK  SUGAR  DAILY  . cyclobenzaprine (FLEXERIL) 10 MG tablet TAKE 1 TABLET THREE TIMES DAILY AS NEEDED FOR MUSCLE SPASMS.  . dapagliflozin propanediol (FARXIGA) 5 MG TABS tablet Take 5 mg by mouth every morning.  . docusate sodium (COLACE) 100 MG capsule Take 1 capsule (100 mg total) by mouth 2 (two) times daily. (Patient taking differently: Take 100 mg by mouth 2 (two) times daily as needed for mild constipation. )  . glipiZIDE (GLUCOTROL XL) 10 MG 24 hr tablet Take 1/2 tablet daily  . glucose blood (ACCU-CHEK AVIVA PLUS) test strip CHECK  SUGAR  DAILY  . hydrochlorothiazide (HYDRODIURIL) 25 MG tablet TAKE 1 TABLET (25 MG TOTAL) DAILY  . lisinopril (ZESTRIL) 20 MG tablet TAKE 1 TABLET (20 MG TOTAL) DAILY  . metFORMIN (GLUCOPHAGE) 850 MG tablet TAKE 1 TABLET  TWICE DAILY WITH MEALS  . Multiple Vitamin (MULTIVITAMIN WITH MINERALS) TABS tablet Take 1 tablet by mouth daily.  . Omega-3 Fatty Acids (OMEGA 3 500 PO) Take 500 mg by mouth daily.  Marland Kitchen omeprazole (PRILOSEC) 40 MG capsule Take 1 capsule (40 mg total) by mouth daily as needed (for heartburn/indigestion.).  Marland Kitchen pioglitazone (ACTOS) 30 MG tablet Take 1 tablet (30 mg total) by mouth daily.  . tadalafil (CIALIS) 20 MG tablet Take 20 mg by mouth daily as needed for erectile dysfunction. Reported on 04/23/2016   No facility-administered medications prior to visit.    Review of Systems  Constitutional: Negative.   Respiratory: Negative.   Cardiovascular: Negative.   Musculoskeletal: Negative.   Skin:       Painful mole        Objective    BP (!) 137/93 (BP Location: Right Arm, Patient Position: Sitting, Cuff Size: Large)   Pulse 95   Temp (!) 96.9 F (36.1 C) (Temporal)   Wt 205 lb 12.8 oz (93.4 kg)   SpO2 99%   BMI 30.39 kg/m     Physical Exam   General: Appearance:    Obese male in no acute distress  Eyes:    PERRL, conjunctiva/corneas clear, EOM's intact       Lungs:     Clear to auscultation bilaterally, respirations unlabored  Heart:    Normal heart rate.  Normal rhythm. No murmurs, rubs, or gallops.   MS:   All extremities are intact.   Neurologic:   Awake, alert, oriented x 3. No apparent focal neurological           defect.         No results found for any visits on 04/06/20.  Assessment & Plan    1. Skin tag/irritated Areas patient prepped with Betadine and then anesthesia with lidocaine with epi.  Couple is used to tag below the base.  The stasis with direct pressure.  Tolerated procedure well. 2. Inflamed skin tag      Return if symptoms worsen or fail to improve.      /   Wilhemena Durie, MD  [facility] [phone] (phone) [phone] (fax)  [place]

## 2020-04-13 ENCOUNTER — Other Ambulatory Visit: Payer: Self-pay | Admitting: Family Medicine

## 2020-04-13 DIAGNOSIS — E119 Type 2 diabetes mellitus without complications: Secondary | ICD-10-CM

## 2020-04-14 NOTE — Telephone Encounter (Signed)
Requested Prescriptions  Pending Prescriptions Disp Refills  . glipiZIDE (GLUCOTROL XL) 10 MG 24 hr tablet [Pharmacy Med Name: GLIPIZIDE ER 10 MG Tablet Extended Release 24 Hour] 180 tablet 0    Sig: TAKE 2 TABLETS EVERY DAY     Endocrinology:  Diabetes - Sulfonylureas Passed - 04/13/2020  7:58 PM      Passed - HBA1C is between 0 and 7.9 and within 180 days    Hemoglobin A1C  Date Value Ref Range Status  03/15/2020 7.5 (A) 4.0 - 5.6 % Final   Hgb A1c MFr Bld  Date Value Ref Range Status  12/13/2016 8.6 (H) 4.8 - 5.6 % Final    Comment:    (NOTE)         Pre-diabetes: 5.7 - 6.4         Diabetes: >6.4         Glycemic control for adults with diabetes: <7.0          Passed - Valid encounter within last 6 months    Recent Outpatient Visits          1 week ago Skin lesion of back   [facility] Maple Hudson., MD   1 month ago Diabetes mellitus without complication [facility])   [facility] Malva Limes, MD   4 months ago Diabetes mellitus without complication [facility])   [facility] Malva Limes, MD   8 months ago Diabetes mellitus without complication [facility])   [facility] Malva Limes, MD   11 months ago Essential hypertension   [facility] Sherrie Mustache, Demetrios Isaacs, MD

## 2020-05-18 ENCOUNTER — Other Ambulatory Visit: Payer: Self-pay | Admitting: Family Medicine

## 2020-05-18 DIAGNOSIS — I10 Essential (primary) hypertension: Secondary | ICD-10-CM

## 2020-06-08 ENCOUNTER — Other Ambulatory Visit: Payer: Self-pay | Admitting: Family Medicine

## 2020-06-08 DIAGNOSIS — E119 Type 2 diabetes mellitus without complications: Secondary | ICD-10-CM

## 2020-07-15 ENCOUNTER — Encounter: Payer: Self-pay | Admitting: Family Medicine

## 2020-07-19 DIAGNOSIS — E663 Overweight: Secondary | ICD-10-CM | POA: Diagnosis not present

## 2020-07-19 DIAGNOSIS — K219 Gastro-esophageal reflux disease without esophagitis: Secondary | ICD-10-CM | POA: Diagnosis not present

## 2020-07-19 DIAGNOSIS — E559 Vitamin D deficiency, unspecified: Secondary | ICD-10-CM | POA: Diagnosis not present

## 2020-07-19 DIAGNOSIS — Z23 Encounter for immunization: Secondary | ICD-10-CM | POA: Diagnosis not present

## 2020-07-19 DIAGNOSIS — Z125 Encounter for screening for malignant neoplasm of prostate: Secondary | ICD-10-CM | POA: Diagnosis not present

## 2020-07-19 DIAGNOSIS — H02402 Unspecified ptosis of left eyelid: Secondary | ICD-10-CM | POA: Diagnosis not present

## 2020-07-19 DIAGNOSIS — E785 Hyperlipidemia, unspecified: Secondary | ICD-10-CM | POA: Diagnosis not present

## 2020-07-19 DIAGNOSIS — Z79899 Other long term (current) drug therapy: Secondary | ICD-10-CM | POA: Diagnosis not present

## 2020-07-19 DIAGNOSIS — E1169 Type 2 diabetes mellitus with other specified complication: Secondary | ICD-10-CM | POA: Diagnosis not present

## 2020-08-16 ENCOUNTER — Other Ambulatory Visit: Payer: Self-pay | Admitting: Family Medicine

## 2020-08-16 DIAGNOSIS — E119 Type 2 diabetes mellitus without complications: Secondary | ICD-10-CM

## 2020-08-17 NOTE — Telephone Encounter (Signed)
Requested Prescriptions  Pending Prescriptions Disp Refills  . glipiZIDE (GLUCOTROL XL) 10 MG 24 hr tablet [Pharmacy Med Name: GLIPIZIDE ER 10 MG Tablet Extended Release 24 Hour] 180 tablet 0    Sig: TAKE 2 TABLETS EVERY DAY     Endocrinology:  Diabetes - Sulfonylureas Passed - 08/16/2020  7:41 PM      Passed - HBA1C is between 0 and 7.9 and within 180 days    Hemoglobin A1C  Date Value Ref Range Status  03/15/2020 7.5 (A) 4.0 - 5.6 % Final   Hgb A1c MFr Bld  Date Value Ref Range Status  12/13/2016 8.6 (H) 4.8 - 5.6 % Final    Comment:    (NOTE)         Pre-diabetes: 5.7 - 6.4         Diabetes: >6.4         Glycemic control for adults with diabetes: <7.0          Passed - Valid encounter within last 6 months    Recent Outpatient Visits          4 months ago Skin lesion of back   [facility] Maple Hudson., MD   5 months ago Diabetes mellitus without complication [facility])   [facility] Malva Limes, MD   8 months ago Diabetes mellitus without complication [facility])   [facility] Malva Limes, MD   1 year ago Diabetes mellitus without complication [facility])   [facility] Malva Limes, MD   1 year ago Essential hypertension   [facility] Malva Limes, MD      Future Appointments            In 1 month Fisher, Demetrios Isaacs, MD [facility]

## 2020-09-01 ENCOUNTER — Other Ambulatory Visit: Payer: Self-pay | Admitting: Family Medicine

## 2020-09-01 DIAGNOSIS — I10 Essential (primary) hypertension: Secondary | ICD-10-CM

## 2020-09-07 ENCOUNTER — Other Ambulatory Visit: Payer: Self-pay | Admitting: Family Medicine

## 2020-09-07 DIAGNOSIS — I10 Essential (primary) hypertension: Secondary | ICD-10-CM

## 2020-09-07 MED ORDER — LISINOPRIL 20 MG PO TABS: 20.0000 mg | ORAL_TABLET | Freq: Every day | ORAL | 0 refills | Status: DC

## 2020-09-07 NOTE — Telephone Encounter (Signed)
PT ned a refill  lisinopril (ZESTRIL) 20 MG tablet [[id number]]  [facility] - [place], [place] - [address]  [address] [place] [address]  Phone:  [phone] Fax:  [phone]

## 2020-09-09 NOTE — Progress Notes (Signed)
Complete physical exam   Patient: Connor [name]   DOB: [birth year]   59 y.o. Male  MRN: [id number] Visit Date: 09/12/2020  Today's healthcare provider: Lelon Huh, MD   Chief Complaint  Patient presents with  . Annual Exam  . Diabetes  . Hyperlipidemia  . Hypertension   Subjective    Connor [name] is a 59 y.o. male who presents today for a complete physical exam.  He reports consuming a general diet. The patient does not participate in regular exercise at present. He generally feels fairly well. He reports sleeping fairly well. He does not have additional problems to discuss today.   Has AWV with [facility] on 03/15/2020   HPI  Diabetes Mellitus Type II, Follow-up  Lab Results  Component Value Date   HGBA1C 7.5 (A) 03/15/2020   HGBA1C 8.2 (A) 12/15/2019   HGBA1C 6.6 (A) 08/18/2019   Wt Readings from Last 3 Encounters:  09/12/20 200 lb (90.7 kg)  04/06/20 205 lb 12.8 oz (93.4 kg)  03/15/20 205 lb (93 kg)   Last seen for diabetes 6 months ago.  Management since then includes continue same medication. He reports good compliance with treatment. He is not having side effects.  Symptoms: No fatigue No foot ulcerations  No appetite changes No nausea  No paresthesia of the feet  No polydipsia  No polyuria No visual disturbances   No vomiting     Home blood sugar records: blood sugars are not checked  Episodes of hypoglycemia? No    Current insulin regiment: none Most Recent Eye Exam: 02/29/2020 Current exercise: none Current diet habits: in general, an "unhealthy" diet  Pertinent Labs: Lab Results  Component Value Date   CHOL 117 04/28/2019   HDL 40 04/28/2019   LDLCALC 54 04/28/2019   TRIG 114 04/28/2019   CHOLHDL 2.9 04/28/2019   Lab Results  Component Value Date   NA 140 12/15/2019   K 4.4 12/15/2019   CREATININE 0.97 12/15/2019   GFRNONAA 86 12/15/2019   GFRAA 100 12/15/2019   GLUCOSE 180 (H) 12/15/2019      ---------------------------------------------------------------------------------------------------  Hypertension, follow-up  BP Readings from Last 3 Encounters:  09/12/20 117/86  04/06/20 (!) 137/93  03/15/20 (!) 130/92   Wt Readings from Last 3 Encounters:  09/12/20 200 lb (90.7 kg)  04/06/20 205 lb 12.8 oz (93.4 kg)  03/15/20 205 lb (93 kg)     He was last seen for hypertension 6 months ago.  BP at that visit was 130/92. Management since that visit includes continue same medication.  He reports good compliance with treatment. He is not having side effects.  He is following a Regular diet. He is not exercising. He does not smoke.  Use of agents associated with hypertension: NSAIDS.   Outside blood pressures are checked; patient doesn't remember what it usually averages. Symptoms: No chest pain No chest pressure  No palpitations No syncope  No dyspnea No orthopnea  No paroxysmal nocturnal dyspnea No lower extremity edema   Pertinent labs: Lab Results  Component Value Date   CHOL 117 04/28/2019   HDL 40 04/28/2019   LDLCALC 54 04/28/2019   TRIG 114 04/28/2019   CHOLHDL 2.9 04/28/2019   Lab Results  Component Value Date   NA 140 12/15/2019   K 4.4 12/15/2019   CREATININE 0.97 12/15/2019   GFRNONAA 86 12/15/2019   GFRAA 100 12/15/2019   GLUCOSE 180 (H) 12/15/2019     The ASCVD Risk score Mikey Bussing  DC Jr., et al., 2013) failed to calculate for the following reasons:   The valid total cholesterol range is 130 to 320 mg/dL   ---------------------------------------------------------------------------------------------------  Lipid/Cholesterol, Follow-up  Last lipid panel Other pertinent labs  Lab Results  Component Value Date   CHOL 117 04/28/2019   HDL 40 04/28/2019   LDLCALC 54 04/28/2019   TRIG 114 04/28/2019   CHOLHDL 2.9 04/28/2019   Lab Results  Component Value Date   ALT 33 04/28/2019   AST 27 04/28/2019   PLT 254 01/07/2018     He was last  seen for this 1 year ago.  Management since that visit includes continue same medication.  He reports good compliance with treatment. He is not having side effects.   Symptoms: No chest pain No chest pressure/discomfort  No dyspnea No lower extremity edema  No numbness or tingling of extremity No orthopnea  No palpitations No paroxysmal nocturnal dyspnea  No speech difficulty No syncope   Current diet: in general, an "unhealthy" diet Current exercise: none  The ASCVD Risk score (Stanton., et al., 2013) failed to calculate for the following reasons:   The valid total cholesterol range is 130 to 320 mg/dL  --------------------------------------------------------------------------------------------------- He has history of ALS previously followed by Dr. Manuella Ghazi who he last saw in 2017, but he reports he is now being followed by a  Neurologist in [place]. He is also followed by Dr. Annamaria Boots for strabismus and was last seen in August 2020.    Past Medical History:  Diagnosis Date  . Arthritis    cervical spondylosis, myelopathy   . GERD (gastroesophageal reflux disease)   . Hyperlipidemia   . Hypertension   . Lazy eye, left    Dr Josephine Cables   Past Surgical History:  Procedure Laterality Date  . ANTERIOR CERVICAL DECOMPRESSION/DISCECTOMY FUSION 4 LEVELS N/A 01/21/2017   Procedure: ANTERIOR CERVICAL DECOMPRESSION/DISCECTOMY FUSION , INTERBODY PROSTHESIS,PLATE CERVICAL THREE- CERVICAL FOUR,CERVICAL FOUR- CERVICAL FIVE, CERVICAL FIVE- CERVICAL SIX, CERVICAL SIX- CERVICAL SEVEN;  Surgeon: Newman Pies, MD;  Location: [place];  Service: Neurosurgery;  Laterality: N/A;  . CARPAL TUNNEL RELEASE Right 01/13/2018   Procedure: CARPAL TUNNEL RELEASE RIGHT;  Surgeon: Newman Pies, MD;  Location: [place];  Service: Neurosurgery;  Laterality: Right;  . COLONOSCOPY WITH PROPOFOL N/A 08/30/2015   Procedure: COLONOSCOPY WITH PROPOFOL;  Surgeon: Lucilla Lame, MD;  Location: [facility];  Service:  Endoscopy;  Laterality: N/A;  . STRABISMUS SURGERY Bilateral 07/10/2019   Procedure: STRABISMUS REPAIR BOTH EYES;  Surgeon: Everitt Amber, MD;  Location: [place];  Service: Ophthalmology;  Laterality: Bilateral;   Social History   Socioeconomic History  . Marital status: Married    Spouse name: Not on file  . Number of children: 2  . Years of education: Not on file  . Highest education level: High school graduate  Occupational History  . Occupation: unemployed  Tobacco Use  . Smoking status: Never Smoker  . Smokeless tobacco: Never Used  Vaping Use  . Vaping Use: Never used  Substance and Sexual Activity  . Alcohol use: No    Alcohol/week: 0.0 standard drinks  . Drug use: No  . Sexual activity: Not on file  Other Topics Concern  . Not on file  Social History Narrative  . Not on file   Social Determinants of Health   Financial Resource Strain: Low Risk   . Difficulty of Paying Living Expenses: Not hard at all  Food Insecurity: No Food Insecurity  .  Worried About Charity fundraiser in the Last Year: Never true  . Ran Out of Food in the Last Year: Never true  Transportation Needs: No Transportation Needs  . Lack of Transportation (Medical): No  . Lack of Transportation (Non-Medical): No  Physical Activity: Inactive  . Days of Exercise per Week: 0 days  . Minutes of Exercise per Session: 0 min  Stress: No Stress Concern Present  . Feeling of Stress : Not at all  Social Connections: Moderately Integrated  . Frequency of Communication with Friends and Family: More than three times a week  . Frequency of Social Gatherings with Friends and Family: Once a week  . Attends Religious Services: More than 4 times per year  . Active Member of Clubs or Organizations: No  . Attends Archivist Meetings: Never  . Marital Status: Married  Human resources officer Violence: Not At Risk  . Fear of Current or Ex-Partner: No  . Emotionally Abused: No  . Physically  Abused: No  . Sexually Abused: No   Family Status  Relation Name Status  . Mother  Alive  . Father  Alive  . Sister  Alive  . Brother  Alive   Family History  Problem Relation Age of Onset  . Diabetes Mother   . Hyperlipidemia Mother   . Hypertension Mother   . Diabetes Father   . Hypertension Father   . Hyperlipidemia Father    Allergies  Allergen Reactions  . No Known Allergies     Patient Care Team: Birdie Sons, MD as PCP - General (Family Medicine) Everitt Amber, MD as Consulting Physician (Ophthalmology)   Medications: Outpatient Medications Prior to Visit  Medication Sig  . ACCU-CHEK SOFTCLIX LANCETS lancets CHECK  SUGAR  DAILY  . Alcohol Swabs (B-D SINGLE USE SWABS REGULAR) PADS CHECK  SUGAR  DAILY  . aspirin EC 81 MG tablet Take 81 mg by mouth daily.  Marland Kitchen atorvastatin (LIPITOR) 40 MG tablet Take 1 tablet (40 mg total) by mouth daily.  . Blood Glucose Monitoring Suppl (ACCU-CHEK AVIVA PLUS) w/Device KIT CHECK  SUGAR  DAILY  . cyclobenzaprine (FLEXERIL) 10 MG tablet TAKE 1 TABLET THREE TIMES DAILY AS NEEDED FOR MUSCLE SPASMS.  . dapagliflozin propanediol (FARXIGA) 5 MG TABS tablet Take 5 mg by mouth every morning.  . docusate sodium (COLACE) 100 MG capsule Take 1 capsule (100 mg total) by mouth 2 (two) times daily. (Patient taking differently: Take 100 mg by mouth 2 (two) times daily as needed for mild constipation. )  . glipiZIDE (GLUCOTROL XL) 10 MG 24 hr tablet TAKE 2 TABLETS EVERY DAY  . glucose blood (ACCU-CHEK AVIVA PLUS) test strip CHECK  SUGAR  DAILY  . hydrochlorothiazide (HYDRODIURIL) 25 MG tablet TAKE 1 TABLET (25 MG TOTAL) DAILY  . lisinopril (ZESTRIL) 20 MG tablet Take 1 tablet (20 mg total) by mouth daily.  . metFORMIN (GLUCOPHAGE) 850 MG tablet TAKE 1 TABLET TWICE DAILY WITH MEALS  . Multiple Vitamin (MULTIVITAMIN WITH MINERALS) TABS tablet Take 1 tablet by mouth daily.  . Omega-3 Fatty Acids (OMEGA 3 500 PO) Take 500 mg by mouth daily.  Marland Kitchen  omeprazole (PRILOSEC) 40 MG capsule Take 1 capsule (40 mg total) by mouth daily as needed (for heartburn/indigestion.).  Marland Kitchen pioglitazone (ACTOS) 30 MG tablet Take 1 tablet (30 mg total) by mouth daily.  . tadalafil (CIALIS) 20 MG tablet Take 20 mg by mouth daily as needed for erectile dysfunction. Reported on 04/23/2016   No facility-administered  medications prior to visit.    Review of Systems  Constitutional: Negative for appetite change, chills, fatigue and fever.  HENT: Negative for congestion, ear pain, hearing loss, nosebleeds and trouble swallowing.   Eyes: Negative for pain and visual disturbance.  Respiratory: Negative for cough, chest tightness and shortness of breath.   Cardiovascular: Negative for chest pain, palpitations and leg swelling.  Gastrointestinal: Negative for abdominal pain, blood in stool, constipation, diarrhea, nausea and vomiting.  Endocrine: Negative for polydipsia, polyphagia and polyuria.  Genitourinary: Negative for dysuria and flank pain.  Musculoskeletal: Negative for arthralgias, back pain, joint swelling, myalgias and neck stiffness.  Skin: Negative for color change, rash and wound.  Neurological: Negative for dizziness, tremors, seizures, speech difficulty, weakness, light-headedness and headaches.  Psychiatric/Behavioral: Negative for behavioral problems, confusion, decreased concentration, dysphoric mood and sleep disturbance. The patient is not nervous/anxious.   All other systems reviewed and are negative.    Objective    BP 117/86 (BP Location: Right Arm, Patient Position: Sitting, Cuff Size: Normal)   Pulse (!) 123   Temp 98.7 F (37.1 C) (Oral)   Resp 16   Wt 200 lb (90.7 kg)   BMI 29.53 kg/m   Physical Exam   General Appearance:     Overweight male. Alert, cooperative, in no acute distress, appears stated age  Head:    Normocephalic, without obvious abnormality, atraumatic  Eyes:    PERRL, conjunctiva/corneas clear, fundi    benign,  both eyes. Ptosis of left eye  Ears:    Normal TM's and external ear canals, both ears  Neck:   Supple, symmetrical, trachea midline, no adenopathy;       thyroid:  No enlargement/tenderness/nodules; no carotid   bruit or JVD  Back:     Symmetric, no curvature, ROM normal, no CVA tenderness  Lungs:     Clear to auscultation bilaterally, respirations unlabored  Chest wall:    No tenderness or deformity  Heart:    Tachycardic. Normal rhythm. No murmurs, rubs, or gallops.  S1 and S2 normal  Abdomen:     Soft, non-tender, bowel sounds active all four quadrants,    no masses, no organomegaly  Genitalia:   deferred  Rectal:    deferred  Extremities:   All extremities are intact. No cyanosis or edema  Pulses:   2+ and symmetric all extremities  Skin:   Skin color, texture, turgor normal, no rashes or lesions  Lymph nodes:   Cervical, supraclavicular, and axillary nodes normal  Neurologic:   CNII-XII intact. Normal strength, sensation and reflexes      Throughout. Hypertonic upper extremities.      Last depression screening scores PHQ 2/9 Scores 09/12/2020 03/15/2020 12/15/2019  PHQ - 2 Score 1 0 0  PHQ- 9 Score 1 - -   Last fall risk screening Fall Risk  09/12/2020  Falls in the past year? 0  Number falls in past yr: 0  Injury with Fall? 0  Follow up Falls evaluation completed   Last Audit-C alcohol use screening Alcohol Use Disorder Test (AUDIT) 03/15/2020  1. How often do you have a drink containing alcohol? 0  2. How many drinks containing alcohol do you have on a typical day when you are drinking? 0  3. How often do you have six or more drinks on one occasion? 0  AUDIT-C Score 0  Alcohol Brief Interventions/Follow-up AUDIT Score <7 follow-up not indicated   A score of 3 or more in women, and 4 or  more in men indicates increased risk for alcohol abuse, EXCEPT if all of the points are from question 1   No results found for any visits on 09/12/20.  Assessment & Plan    Routine  Health Maintenance and Physical Exam  Exercise Activities and Dietary recommendations Goals    . Exercise 3x per week (30 min per time)     Recommend to exercise for 3 days a week for at least 30 minutes at a time.        Immunization History  Administered Date(s) Administered  . Influenza,inj,Quad PF,6+ Mos 08/10/2016, 08/18/2017, 09/05/2018, 08/18/2019  . Influenza,inj,Quad PF,6-35 Mos 09/07/2020  . Influenza-Unspecified 09/19/2017  . Tdap 04/16/2014    Health Maintenance  Topic Date Due  . PNEUMOCOCCAL POLYSACCHARIDE VACCINE AGE 28-64 HIGH RISK  Never done  . FOOT EXAM  Never done  . COVID-19 Vaccine (1) Never done  . COLONOSCOPY  08/30/2019  . HEMOGLOBIN A1C  09/14/2020  . OPHTHALMOLOGY EXAM  02/28/2021  . TETANUS/TDAP  04/16/2024  . INFLUENZA VACCINE  Completed  . Hepatitis C Screening  Completed  . HIV Screening  Completed    Discussed health benefits of physical activity, and encouraged him to engage in regular exercise appropriate for his age and condition.  1. Annual physical exam  - CBC - Comprehensive metabolic panel - Lipid panel - EKG 12-Lead  2. Prostate cancer screening  - PSA Total (Reflex To Free) ([facility] only)  3. ALS (amyotrophic lateral sclerosis) [facility]) He reports he is now being followed by neurologist in [place].   4. Primary hypertension Well controlled.  Continue current medications.   - Lipid panel - EKG 12-Lead  5. Mixed hyperlipidemia He is tolerating atorvastatin well with no adverse effects.   - CBC - Comprehensive metabolic panel - TSH  6. Diabetes mellitus without complication ([place]) Doing well current medications.  - Hemoglobin A1c - EKG 12-Lead   No follow-ups on file.     The entirety of the information documented in the History of Present Illness, Review of Systems and Physical Exam were personally obtained by me. Portions of this information were initially documented by the CMA and reviewed by me for thoroughness  and accuracy.      Lelon Huh, MD  [facility] [phone] (phone) [phone] (fax)  [facility]

## 2020-09-12 ENCOUNTER — Other Ambulatory Visit: Payer: Self-pay

## 2020-09-12 ENCOUNTER — Other Ambulatory Visit: Payer: Self-pay | Admitting: Family Medicine

## 2020-09-12 ENCOUNTER — Encounter: Payer: Self-pay | Admitting: Family Medicine

## 2020-09-12 ENCOUNTER — Ambulatory Visit (INDEPENDENT_AMBULATORY_CARE_PROVIDER_SITE_OTHER): Payer: Medicare HMO | Admitting: Family Medicine

## 2020-09-12 VITALS — BP 117/86 | HR 123 | Temp 98.7°F | Resp 16 | Ht 69.0 in | Wt 200.0 lb

## 2020-09-12 DIAGNOSIS — I10 Essential (primary) hypertension: Secondary | ICD-10-CM | POA: Diagnosis not present

## 2020-09-12 DIAGNOSIS — Z0000 Encounter for general adult medical examination without abnormal findings: Secondary | ICD-10-CM

## 2020-09-12 DIAGNOSIS — E782 Mixed hyperlipidemia: Secondary | ICD-10-CM

## 2020-09-12 DIAGNOSIS — G1221 Amyotrophic lateral sclerosis: Secondary | ICD-10-CM

## 2020-09-12 DIAGNOSIS — E119 Type 2 diabetes mellitus without complications: Secondary | ICD-10-CM | POA: Diagnosis not present

## 2020-09-12 DIAGNOSIS — Z125 Encounter for screening for malignant neoplasm of prostate: Secondary | ICD-10-CM

## 2020-09-12 NOTE — Patient Instructions (Signed)
.   Please review the attached list of medications and notify my office if there are any errors.   . Please bring all of your medications to every appointment so we can make sure that our medication list is the same as yours.    Please call [facility] at [phone] to schedule colonoscopy to follow up on previous colon polyps

## 2020-09-13 LAB — COMPREHENSIVE METABOLIC PANEL
ALT: 23 IU/L (ref 0–44)
AST: 20 IU/L (ref 0–40)
Albumin/Globulin Ratio: 1.5 (ref 1.2–2.2)
Albumin: 4.9 g/dL (ref 3.8–4.9)
Alkaline Phosphatase: 93 IU/L (ref 44–121)
BUN/Creatinine Ratio: 18 (ref 9–20)
BUN: 22 mg/dL (ref 6–24)
Bilirubin Total: 0.5 mg/dL (ref 0.0–1.2)
CO2: 20 mmol/L (ref 20–29)
Calcium: 10.1 mg/dL (ref 8.7–10.2)
Chloride: 97 mmol/L (ref 96–106)
Creatinine, Ser: 1.23 mg/dL (ref 0.76–1.27)
GFR calc Af Amer: 74 mL/min/{1.73_m2} (ref >59)
GFR calc non Af Amer: 64 mL/min/{1.73_m2} (ref >59)
Globulin, Total: 3.2 g/dL (ref 1.5–4.5)
Glucose: 96 mg/dL (ref 65–99)
Potassium: 4.2 mmol/L (ref 3.5–5.2)
Sodium: 140 mmol/L (ref 134–144)
Total Protein: 8.1 g/dL (ref 6.0–8.5)

## 2020-09-13 LAB — CBC
Hematocrit: 51.6 % — ABNORMAL HIGH (ref 37.5–51.0)
Hemoglobin: 17.3 g/dL (ref 13.0–17.7)
MCH: 28.9 pg (ref 26.6–33.0)
MCHC: 33.5 g/dL (ref 31.5–35.7)
MCV: 86 fL (ref 79–97)
Platelets: 305 10*3/uL (ref 150–450)
RBC: 5.99 x10E6/uL — ABNORMAL HIGH (ref 4.14–5.80)
RDW: 13.9 % (ref 11.6–15.4)
WBC: 7 10*3/uL (ref 3.4–10.8)

## 2020-09-13 LAB — LIPID PANEL
Chol/HDL Ratio: 3.8 ratio (ref 0.0–5.0)
Cholesterol, Total: 159 mg/dL (ref 100–199)
HDL: 42 mg/dL (ref >39)
LDL Chol Calc (NIH): 98 mg/dL (ref 0–99)
Triglycerides: 101 mg/dL (ref 0–149)
VLDL Cholesterol Cal: 19 mg/dL (ref 5–40)

## 2020-09-13 LAB — HEMOGLOBIN A1C
Est. average glucose Bld gHb Est-mCnc: 166 mg/dL
Hgb A1c MFr Bld: 7.4 % — ABNORMAL HIGH (ref 4.8–5.6)

## 2020-09-13 LAB — PSA TOTAL (REFLEX TO FREE): Prostate Specific Ag, Serum: 1.2 ng/mL (ref 0.0–4.0)

## 2020-09-13 LAB — TSH: TSH: 2 u[IU]/mL (ref 0.450–4.500)

## 2020-09-16 ENCOUNTER — Telehealth: Payer: Self-pay | Admitting: Family Medicine

## 2020-09-16 NOTE — Telephone Encounter (Signed)
Patient states jardiance is expensive seeking alternate or samples, patient would like a follow up call to discuss alternates

## 2020-09-23 ENCOUNTER — Encounter: Payer: Medicare HMO | Admitting: Family Medicine

## 2020-10-21 HISTORY — PX: LEVATOR RESECTION: SHX1965

## 2020-10-25 ENCOUNTER — Encounter: Payer: Self-pay | Admitting: Family Medicine

## 2020-10-25 ENCOUNTER — Other Ambulatory Visit: Payer: Self-pay | Admitting: Family Medicine

## 2020-10-25 DIAGNOSIS — E119 Type 2 diabetes mellitus without complications: Secondary | ICD-10-CM

## 2020-12-07 ENCOUNTER — Other Ambulatory Visit: Payer: Self-pay | Admitting: Family Medicine

## 2020-12-07 DIAGNOSIS — I10 Essential (primary) hypertension: Secondary | ICD-10-CM

## 2021-01-05 ENCOUNTER — Other Ambulatory Visit: Payer: Self-pay | Admitting: Family Medicine

## 2021-01-05 DIAGNOSIS — E119 Type 2 diabetes mellitus without complications: Secondary | ICD-10-CM

## 2021-02-01 ENCOUNTER — Other Ambulatory Visit: Payer: Self-pay | Admitting: Family Medicine

## 2021-02-01 NOTE — Telephone Encounter (Signed)
Requested medications are due for refill today.  yes  Requested medications are on the active medications list.  yes  Last refill. 03/22/2020  Future visit scheduled.   yes  Notes to clinic.  Medication not delegted

## 2021-02-23 ENCOUNTER — Other Ambulatory Visit: Payer: Self-pay | Admitting: Family Medicine

## 2021-02-23 NOTE — Telephone Encounter (Signed)
Requested medications are due for refill today.  yes  Requested medications are on the active medications list.  yes  Last refill. 11/06/2018  Future visit scheduled.   yes  Notes to clinic.  Rx Expired.

## 2021-03-01 LAB — HM DIABETES EYE EXAM

## 2021-03-03 ENCOUNTER — Other Ambulatory Visit: Payer: Self-pay | Admitting: Family Medicine

## 2021-03-03 MED ORDER — METFORMIN HCL 850 MG PO TABS: 850.0000 mg | ORAL_TABLET | Freq: Two times a day (BID) | ORAL | 0 refills | Status: DC

## 2021-03-03 MED ORDER — HYDROCHLOROTHIAZIDE 25 MG PO TABS: ORAL_TABLET | ORAL | 0 refills | Status: DC

## 2021-03-03 NOTE — Telephone Encounter (Signed)
Copied from CRM [id number]. Topic: Quick Communication - Rx Refill/Question >> Mar 03, 2021  3:51 PM Izora Ribas, Everette A wrote: Medication: hydrochlorothiazide (HYDRODIURIL) 25 MG tablet  metFORMIN (GLUCOPHAGE) 1000 MG tablet  Has the patient contacted their pharmacy? Patient's pharmacy has made contact on their behalf  Preferred Pharmacy (with phone number or street name): [facility] - [place], [place] - [address]  Phone:  [phone] Fax:  [phone]  Agent: Please be advised that RX refills may take up to 3 business days. We ask that you follow-up with your pharmacy.

## 2021-03-03 NOTE — Telephone Encounter (Signed)
Requested medication (s) are due for refill today: expired medication  Requested medication (s) are on the active medication list: yes  Last refill:  04/28/2019 #90 3 refills  Future visit scheduled: yes in 1 week  Notes to clinic:  expired medication. Do you want to renew Rx?     Requested Prescriptions  Pending Prescriptions Disp Refills   hydrochlorothiazide (HYDRODIURIL) 25 MG tablet 90 tablet 3    Sig: TAKE 1 TABLET (25 MG TOTAL) DAILY      Cardiovascular: Diuretics - Thiazide Passed - 03/03/2021  4:21 PM      Passed - Ca in normal range and within 360 days    Calcium  Date Value Ref Range Status  09/12/2020 10.1 8.7 - 10.2 mg/dL Final          Passed - Cr in normal range and within 360 days    Creatinine, Ser  Date Value Ref Range Status  09/12/2020 1.23 0.76 - 1.27 mg/dL Final          Passed - K in normal range and within 360 days    Potassium  Date Value Ref Range Status  09/12/2020 4.2 3.5 - 5.2 mmol/L Final          Passed - Na in normal range and within 360 days    Sodium  Date Value Ref Range Status  09/12/2020 140 134 - 144 mmol/L Final          Passed - Last BP in normal range    BP Readings from Last 1 Encounters:  09/12/20 117/86          Passed - Valid encounter within last 6 months    Recent Outpatient Visits           5 months ago Annual physical exam   [facility] Birdie Sons, MD   11 months ago Skin lesion of back   [facility] Jerrol Banana., MD   11 months ago Diabetes mellitus without complication [facility])   [facility] Birdie Sons, MD   1 year ago Diabetes mellitus without complication [facility])   [facility] Birdie Sons, MD   1 year ago Diabetes mellitus without complication [facility])   [facility] Birdie Sons, MD       Future Appointments             In 1 week Fisher, Kirstie Peri, MD [facility]               Signed Prescriptions Disp Refills   metFORMIN (GLUCOPHAGE) 850 MG tablet 180 tablet 0    Sig: Take 1 tablet (850 mg total) by mouth 2 (two) times daily with a meal.      Endocrinology:  Diabetes - Biguanides Passed - 03/03/2021  4:21 PM      Passed - Cr in normal range and within 360 days    Creatinine, Ser  Date Value Ref Range Status  09/12/2020 1.23 0.76 - 1.27 mg/dL Final          Passed - HBA1C is between 0 and 7.9 and within 180 days    Hgb A1c MFr Bld  Date Value Ref Range Status  09/12/2020 7.4 (H) 4.8 - 5.6 % Final    Comment:             Prediabetes: 5.7 - 6.4          Diabetes: >6.4  Glycemic control for adults with diabetes: <7.0           Passed - AA eGFR in normal range and within 360 days    GFR calc Af Amer  Date Value Ref Range Status  09/12/2020 74 >59 mL/min/1.73 Final    Comment:    **In accordance with recommendations from the NKF-ASN Task force,**   [facility] is in the process of updating its eGFR calculation to the   2021 CKD-EPI creatinine equation that estimates kidney function   without a race variable.    GFR calc non Af Amer  Date Value Ref Range Status  09/12/2020 64 >59 mL/min/1.73 Final          Passed - Valid encounter within last 6 months    Recent Outpatient Visits           5 months ago Annual physical exam   [facility] Birdie Sons, MD   11 months ago Skin lesion of back   [facility] Jerrol Banana., MD   11 months ago Diabetes mellitus without complication [facility])   [facility] Birdie Sons, MD   1 year ago Diabetes mellitus without complication [facility])   [facility] Birdie Sons, MD   1 year ago Diabetes mellitus without complication [facility])   [facility] Birdie Sons, MD       Future Appointments             In 1 week Fisher, Kirstie Peri, MD [facility], [place]

## 2021-03-03 NOTE — Telephone Encounter (Signed)
Future visit in 1 week  

## 2021-03-08 ENCOUNTER — Other Ambulatory Visit: Payer: Self-pay | Admitting: Family Medicine

## 2021-03-08 DIAGNOSIS — I10 Essential (primary) hypertension: Secondary | ICD-10-CM

## 2021-03-08 NOTE — Telephone Encounter (Signed)
Requested Prescriptions  Pending Prescriptions Disp Refills  . lisinopril (ZESTRIL) 20 MG tablet [Pharmacy Med Name: LISINOPRIL 20 MG Tablet] 90 tablet 0    Sig: TAKE 1 TABLET (20 MG TOTAL) BY MOUTH DAILY.     Cardiovascular:  ACE Inhibitors Passed - 03/08/2021  7:28 PM      Passed - Cr in normal range and within 180 days    Creatinine, Ser  Date Value Ref Range Status  09/12/2020 1.23 0.76 - 1.27 mg/dL Final         Passed - K in normal range and within 180 days    Potassium  Date Value Ref Range Status  09/12/2020 4.2 3.5 - 5.2 mmol/L Final         Passed - Patient is not pregnant      Passed - Last BP in normal range    BP Readings from Last 1 Encounters:  09/12/20 117/86         Passed - Valid encounter within last 6 months    Recent Outpatient Visits          5 months ago Annual physical exam   [facility] Malva Limes, MD   11 months ago Skin lesion of back   [facility] Maple Hudson., MD   11 months ago Diabetes mellitus without complication [facility])   [facility] Malva Limes, MD   1 year ago Diabetes mellitus without complication [facility])   [facility] Malva Limes, MD   1 year ago Diabetes mellitus without complication [facility])   [facility] Malva Limes, MD      Future Appointments            In 6 days Fisher, Demetrios Isaacs, MD [facility]

## 2021-03-13 NOTE — Progress Notes (Signed)
Established patient visit   Patient: Connor [name]   DOB: 01/18/1961   60 y.o. Male  MRN: [id number] Visit Date: 03/14/2021  Today's healthcare provider: Lelon Huh, MD   Chief Complaint  Patient presents with  . Diabetes  . Hypertension   Subjective    HPI  Diabetes Mellitus Type II, Follow-up  Lab Results  Component Value Date   HGBA1C 9.2 (A) 03/14/2021   HGBA1C 7.4 (H) 09/12/2020   HGBA1C 7.5 (A) 03/15/2020   Wt Readings from Last 3 Encounters:  03/14/21 197 lb 12.8 oz (89.7 kg)  09/12/20 200 lb (90.7 kg)  04/06/20 205 lb 12.8 oz (93.4 kg)   Last seen for diabetes 6 months ago.  Management since then includes continuing same medications. He reports good compliance with treatment.  However he states he is out of [place], but dispense history show it has not been refilled since May 2021.   He is not having side effects.  Symptoms: No fatigue No foot ulcerations  No appetite changes No nausea  No paresthesia of the feet  No polydipsia  No polyuria No visual disturbances   No vomiting     Home blood sugar records: fasting range: 125  Episodes of hypoglycemia? No    Current insulin regiment: none Most Recent Eye Exam: 03/01/2021 Current exercise: none Current diet habits: well balanced  Pertinent Labs: Lab Results  Component Value Date   CHOL 159 09/12/2020   HDL 42 09/12/2020   LDLCALC 98 09/12/2020   TRIG 101 09/12/2020   CHOLHDL 3.8 09/12/2020   Lab Results  Component Value Date   NA 140 09/12/2020   K 4.2 09/12/2020   CREATININE 1.23 09/12/2020   GFRNONAA 64 09/12/2020   GFRAA 74 09/12/2020   GLUCOSE 96 09/12/2020     ---------------------------------------------------------------------------------------------------  Hypertension, follow-up  BP Readings from Last 3 Encounters:  03/14/21 (!) 133/91  09/12/20 117/86  04/06/20 (!) 137/93   Wt Readings from Last 3 Encounters:  03/14/21 197 lb 12.8 oz (89.7 kg)  09/12/20 200 lb  (90.7 kg)  04/06/20 205 lb 12.8 oz (93.4 kg)     He was last seen for hypertension 6 months ago.  BP at that visit was 117/86. Management since that visit includes continue same medication.  He reports good compliance with treatment. He is not having side effects.  He is following a Regular diet. He is not exercising. He does not smoke.  Use of agents associated with hypertension: NSAIDS.   Outside blood pressures are checked occasionally. Patient is unsure of reading. Symptoms: No chest pain No chest pressure  No palpitations No syncope  No dyspnea No orthopnea  No paroxysmal nocturnal dyspnea No lower extremity edema    Pertinent labs: Lab Results  Component Value Date   CHOL 159 09/12/2020   HDL 42 09/12/2020   LDLCALC 98 09/12/2020   TRIG 101 09/12/2020   CHOLHDL 3.8 09/12/2020   Lab Results  Component Value Date   NA 140 09/12/2020   K 4.2 09/12/2020   CREATININE 1.23 09/12/2020   GFRNONAA 64 09/12/2020   GFRAA 74 09/12/2020   GLUCOSE 96 09/12/2020     The 10-year ASCVD risk score Mikey Bussing DC Jr., et al., 2013) is: 23.5%   ---------------------------------------------------------------------------------------------------     Medications: Outpatient Medications Prior to Visit  Medication Sig  . ACCU-CHEK SOFTCLIX LANCETS lancets CHECK  SUGAR  DAILY  . Alcohol Swabs (B-D SINGLE USE SWABS REGULAR) PADS CHECK  SUGAR  DAILY  . aspirin EC 81 MG tablet Take 81 mg by mouth daily.  Marland Kitchen atorvastatin (LIPITOR) 40 MG tablet Take 1 tablet (40 mg total) by mouth daily.  . Blood Glucose Monitoring Suppl (ACCU-CHEK AVIVA PLUS) w/Device KIT CHECK  SUGAR  DAILY  . cyclobenzaprine (FLEXERIL) 10 MG tablet TAKE 1 TABLET THREE TIMES DAILY AS NEEDED FOR MUSCLE SPASMS.  Marland Kitchen docusate sodium (COLACE) 100 MG capsule Take 1 capsule (100 mg total) by mouth 2 (two) times daily. (Patient taking differently: Take 100 mg by mouth 2 (two) times daily as needed for mild constipation.)  . glipiZIDE  (GLUCOTROL XL) 10 MG 24 hr tablet TAKE 2 TABLETS EVERY DAY  . hydrochlorothiazide (HYDRODIURIL) 25 MG tablet TAKE 1 TABLET (25 MG TOTAL) DAILY  . lisinopril (ZESTRIL) 20 MG tablet TAKE 1 TABLET (20 MG TOTAL) BY MOUTH DAILY.  . metFORMIN (GLUCOPHAGE) 850 MG tablet Take 1 tablet (850 mg total) by mouth 2 (two) times daily with a meal.  . Multiple Vitamin (MULTIVITAMIN WITH MINERALS) TABS tablet Take 1 tablet by mouth daily.  . Omega-3 Fatty Acids (OMEGA 3 500 PO) Take 500 mg by mouth daily.  Marland Kitchen omeprazole (PRILOSEC) 40 MG capsule Take 1 capsule (40 mg total) by mouth daily as needed (for heartburn/indigestion.).  Marland Kitchen pioglitazone (ACTOS) 30 MG tablet Take 1 tablet (30 mg total) by mouth daily.  . tadalafil (CIALIS) 20 MG tablet Take 20 mg by mouth daily as needed for erectile dysfunction. Reported on 04/23/2016  . TRUE METRIX BLOOD GLUCOSE TEST test strip USE EVERY DAY FOR TYPE 2 DIABETES MELLITUS  . dapagliflozin propanediol (FARXIGA) 5 MG TABS tablet Take 5 mg by mouth every morning. (Patient not taking: Reported on 03/14/2021)   No facility-administered medications prior to visit.    Review of Systems  Constitutional: Negative for appetite change, chills and fever.  Respiratory: Negative for chest tightness, shortness of breath and wheezing.   Cardiovascular: Negative for chest pain and palpitations.  Gastrointestinal: Negative for abdominal pain, nausea and vomiting.       Objective    BP (!) 133/91 (BP Location: Right Arm, Patient Position: Sitting, Cuff Size: Normal)   Pulse 96   Temp (!) 97.5 F (36.4 C) (Temporal)   Resp 18   Wt 197 lb 12.8 oz (89.7 kg)   BMI 29.21 kg/m     Physical Exam   General: Appearance:     Well developed, well nourished male in no acute distress  Eyes:    PERRL, conjunctiva/corneas clear, EOM's intact       Lungs:     Clear to auscultation bilaterally, respirations unlabored  Heart:    Normal heart rate. Normal rhythm. No murmurs, rubs, or gallops.    MS:   All extremities are intact.   Neurologic:   Awake, alert, oriented x 3. No apparent focal neurological           defect.         Results for orders placed or performed in visit on 03/14/21  POCT HgB A1C  Result Value Ref Range   Hemoglobin A1C 9.2 (A) 4.0 - 5.6 %   Est. average glucose Bld gHb Est-mCnc 217     Assessment & Plan     1. Diabetes mellitus without complication ([place]) Apparently out of [place] since last summer. Insurance now prefers [employer].  - canagliflozin (INVOKANA) 100 MG TABS tablet; Take 1 tablet (100 mg total) by mouth daily before breakfast.  Dispense: 30 tablet; Refill: 3  Recheck in 3 months.   2. Primary hypertension Expect improvement with addition of Invokana       The entirety of the information documented in the History of Present Illness, Review of Systems and Physical Exam were personally obtained by me. Portions of this information were initially documented by the CMA and reviewed by me for thoroughness and accuracy.      Lelon Huh, MD  [facility] [phone] (phone) [phone] (fax)  [place]

## 2021-03-14 ENCOUNTER — Other Ambulatory Visit: Payer: Self-pay

## 2021-03-14 ENCOUNTER — Encounter: Payer: Self-pay | Admitting: Family Medicine

## 2021-03-14 ENCOUNTER — Ambulatory Visit (INDEPENDENT_AMBULATORY_CARE_PROVIDER_SITE_OTHER): Payer: Medicare HMO | Admitting: Family Medicine

## 2021-03-14 VITALS — BP 133/91 | HR 96 | Temp 97.5°F | Resp 18 | Wt 197.8 lb

## 2021-03-14 DIAGNOSIS — I10 Essential (primary) hypertension: Secondary | ICD-10-CM

## 2021-03-14 DIAGNOSIS — E119 Type 2 diabetes mellitus without complications: Secondary | ICD-10-CM | POA: Diagnosis not present

## 2021-03-14 LAB — POCT GLYCOSYLATED HEMOGLOBIN (HGB A1C)
Est. average glucose Bld gHb Est-mCnc: 217
Hemoglobin A1C: 9.2 % — AB (ref 4.0–5.6)

## 2021-03-14 MED ORDER — CANAGLIFLOZIN 100 MG PO TABS: 100.0000 mg | ORAL_TABLET | Freq: Every day | ORAL | 3 refills | Status: DC

## 2021-03-21 ENCOUNTER — Ambulatory Visit: Payer: Medicare HMO

## 2021-04-11 ENCOUNTER — Other Ambulatory Visit: Payer: Self-pay | Admitting: Family Medicine

## 2021-04-11 NOTE — Telephone Encounter (Signed)
Requested medication (s) are due for refill today: yes  Requested medication (s) are on the active medication list: yes  Last refill:  12/30/20  Future visit scheduled: yes  Notes to clinic: historical provider   Requested Prescriptions  Pending Prescriptions Disp Refills   TRUEplus Lancets 33G MISC [Pharmacy Med Name: TRUEPLUS LANCETS 33G] 100 each 3    Sig: USE EVERY DAY FOR TYPE 2 DIABETES MELLITUS      Endocrinology: Diabetes - Testing Supplies Passed - 04/11/2021  1:33 PM      Passed - Valid encounter within last 12 months    Recent Outpatient Visits           4 weeks ago Diabetes mellitus without complication [facility])   [facility] Malva Limes, MD   7 months ago Annual physical exam   [facility] Malva Limes, MD   1 year ago Skin lesion of back   [facility] Maple Hudson., MD   1 year ago Diabetes mellitus without complication [facility])   [facility] Malva Limes, MD   1 year ago Diabetes mellitus without complication [facility])   [facility] Malva Limes, MD       Future Appointments             In 3 months Fisher, Demetrios Isaacs, MD [facility], [facility]

## 2021-05-04 ENCOUNTER — Telehealth: Payer: Self-pay

## 2021-05-04 ENCOUNTER — Other Ambulatory Visit: Payer: Self-pay

## 2021-05-04 MED ORDER — METFORMIN HCL 850 MG PO TABS: 850.0000 mg | ORAL_TABLET | Freq: Two times a day (BID) | ORAL | 4 refills | Status: AC

## 2021-05-04 NOTE — Telephone Encounter (Signed)
[facility]) Pharmacy faxed refill request for the following medications:  metFORMIN (GLUCOPHAGE) 1,000 MG tablet  This strength is not on medication list.   Please advise.

## 2021-05-04 NOTE — Telephone Encounter (Signed)
Please advised.  I don't see this on his current/ or past med list.   Thanks,   Vernona Rieger

## 2021-05-04 NOTE — Telephone Encounter (Signed)
Please review.    I spoke with Mr. Propes he states he has been taking Metformin 1,000mg  twice a day for a few years.  He says "Nadine Counts changed it to 1,000mg  a long time ago."   I did call [employer] he has had the 850mg  filled since at least 2018.      Pharmacy [facility] mail order.   Thanks,   -[facility]

## 2021-05-04 NOTE — Telephone Encounter (Signed)
[facility]) Pharmacy faxed refill request for the following medications:  Tamsulosin 0.4 mg capsule  I do not see on medication list   Please advise.

## 2021-05-04 NOTE — Telephone Encounter (Signed)
We've never prescribed it an no record of it every being prescribed for this pt. Sure they have the right patient?

## 2021-05-09 ENCOUNTER — Other Ambulatory Visit: Payer: Self-pay | Admitting: Family

## 2021-05-09 DIAGNOSIS — R42 Dizziness and giddiness: Secondary | ICD-10-CM

## 2021-05-09 DIAGNOSIS — I70209 Unspecified atherosclerosis of native arteries of extremities, unspecified extremity: Secondary | ICD-10-CM

## 2021-05-09 DIAGNOSIS — R0989 Other specified symptoms and signs involving the circulatory and respiratory systems: Secondary | ICD-10-CM

## 2021-05-11 ENCOUNTER — Other Ambulatory Visit: Payer: Self-pay | Admitting: Family

## 2021-05-11 DIAGNOSIS — S0990XA Unspecified injury of head, initial encounter: Secondary | ICD-10-CM

## 2021-05-16 ENCOUNTER — Other Ambulatory Visit: Payer: Self-pay | Admitting: Family Medicine

## 2021-05-16 ENCOUNTER — Telehealth: Payer: Self-pay

## 2021-05-16 DIAGNOSIS — I10 Essential (primary) hypertension: Secondary | ICD-10-CM

## 2021-05-16 NOTE — Telephone Encounter (Signed)
[facility]) Pharmacy faxed refill request for the following medications:  omeprazole (PRILOSEC) 40 MG capsule     Please advise.

## 2021-05-17 ENCOUNTER — Other Ambulatory Visit: Payer: Self-pay

## 2021-05-17 MED ORDER — OMEPRAZOLE 40 MG PO CPDR: 40.0000 mg | DELAYED_RELEASE_CAPSULE | Freq: Every day | ORAL | 3 refills | Status: AC | PRN

## 2021-05-17 NOTE — Telephone Encounter (Signed)
Done

## 2021-05-29 ENCOUNTER — Ambulatory Visit
Admission: RE | Admit: 2021-05-29 | Discharge: 2021-05-29 | Disposition: A | Discharge status: Home / Self Care | Payer: Medicare Other | Source: Ambulatory Visit | Attending: Family | Admitting: Family

## 2021-05-29 DIAGNOSIS — R0989 Other specified symptoms and signs involving the circulatory and respiratory systems: Secondary | ICD-10-CM

## 2021-05-29 DIAGNOSIS — R42 Dizziness and giddiness: Secondary | ICD-10-CM

## 2021-05-29 DIAGNOSIS — I70209 Unspecified atherosclerosis of native arteries of extremities, unspecified extremity: Secondary | ICD-10-CM

## 2021-06-02 ENCOUNTER — Other Ambulatory Visit: Payer: Self-pay

## 2021-06-02 ENCOUNTER — Ambulatory Visit
Admission: RE | Admit: 2021-06-02 | Discharge: 2021-06-02 | Disposition: A | Discharge status: Home / Self Care | Payer: Medicare HMO | Source: Ambulatory Visit | Attending: Family | Admitting: Family

## 2021-06-02 DIAGNOSIS — S0990XA Unspecified injury of head, initial encounter: Secondary | ICD-10-CM

## 2021-06-06 ENCOUNTER — Telehealth: Payer: Self-pay | Admitting: *Deleted

## 2021-06-06 NOTE — Chronic Care Management (AMB) (Signed)
  Chronic Care Management   Note  06/06/2021 Name: LYRIQ JARCHOW MRN: [id number] DOB: May 20, 1961  JERAMEY LANUZA is a 60 y.o. year old male who is a primary care patient of Fisher, Kirstie Peri, MD. I reached out to Tyrell Antonio by phone today in response to a referral sent by Mr. Beryle Zeitz [facility] PCP Birdie Sons, MD     Mr. Iovino was given information about Chronic Care Management services today including:  CCM service includes personalized support from designated clinical staff supervised by his physician, including individualized plan of care and coordination with other care providers 24/7 contact phone numbers for assistance for urgent and routine care needs. Service will only be billed when office clinical staff spend 20 minutes or more in a month to coordinate care. Only one practitioner may furnish and bill the service in a calendar month. The patient may stop CCM services at any time (effective at the end of the month) by phone call to the office staff. The patient will be responsible for cost sharing (co-pay) of up to 20% of the service fee (after annual deductible is met).  Patient agreed to services and verbal consent obtained.   Follow up plan: Telephone appointment with care management team member scheduled for: 06/13/2021  Julian Hy, [facility] Management  Direct Dial: [phone]

## 2021-06-13 ENCOUNTER — Telehealth: Payer: Self-pay

## 2021-06-13 ENCOUNTER — Telehealth: Payer: Medicare HMO

## 2021-06-13 NOTE — Telephone Encounter (Signed)
  Care Management   Follow Up Note   06/13/2021 Name: Connor [name] MRN: [id number] DOB: [birth year]   Primary Care Provider: Malva Limes, MD Reason for referral : Chronic Care Management   An unsuccessful telephone outreach was attempted today. The patient was referred to the case management team for assistance with care management and care coordination.     Follow Up Plan:  A member of the care management team will attempt to reach Mr. Perkin within the next two weeks.    [facility] Care Management [facility] [phone]      Marti Sleigh [facility] Care [facility] [phone]

## 2021-06-27 ENCOUNTER — Telehealth: Payer: Self-pay

## 2021-06-27 NOTE — Telephone Encounter (Signed)
  Care Management   Follow Up Note   06/27/2021 Name: Connor [name] MRN: [id number] DOB: 16-May-1961   Primary Care Provider: Malva Limes, MD Reason for referral : Chronic Care Management   A second unsuccessful telephone outreach was attempted today. The patient was referred to the case management team for assistance with care management and care coordination.    Follow Up Plan:  A HIPAA compliant voice message was left today requesting a return call.   [facility] Care Management [facility] [phone]

## 2021-07-06 ENCOUNTER — Ambulatory Visit: Payer: Self-pay

## 2021-07-06 DIAGNOSIS — I10 Essential (primary) hypertension: Secondary | ICD-10-CM

## 2021-07-06 NOTE — Chronic Care Management (AMB) (Signed)
  Chronic Care Management   CCM RN Visit Note  07/06/2021 Name: Connor [name] MRN: [id number] DOB: Aug 01, 1961  Subjective: Connor [name] is a 60 y.o. year old male who is a primary care patient of Fisher, Demetrios Isaacs, MD. The care management team was consulted for assistance with disease management and care coordination [name].  His primary care provider will be notified of our unsuccessful attempts to maintain contact. The care management team will gladly outreach at any time in the future if he is interested in receiving assistance.   PLAN The care management team will gladly follow up with [name] after the primary care provider has a conversation with him regarding recommendation for care management engagement and subsequent re-referral for care management services.     [facility] Care Management [facility] [phone]

## 2021-07-10 ENCOUNTER — Ambulatory Visit: Payer: Self-pay | Admitting: Family Medicine

## 2021-08-11 ENCOUNTER — Ambulatory Visit (INDEPENDENT_AMBULATORY_CARE_PROVIDER_SITE_OTHER): Payer: Medicare HMO | Admitting: Family Medicine

## 2021-08-11 ENCOUNTER — Other Ambulatory Visit: Payer: Self-pay

## 2021-08-11 ENCOUNTER — Encounter: Payer: Self-pay | Admitting: Family Medicine

## 2021-08-11 VITALS — BP 145/74 | HR 111 | Temp 98.4°F | Ht 66.0 in | Wt 172.8 lb

## 2021-08-11 DIAGNOSIS — E782 Mixed hyperlipidemia: Secondary | ICD-10-CM

## 2021-08-11 DIAGNOSIS — Z23 Encounter for immunization: Secondary | ICD-10-CM | POA: Diagnosis not present

## 2021-08-11 DIAGNOSIS — E119 Type 2 diabetes mellitus without complications: Secondary | ICD-10-CM | POA: Diagnosis not present

## 2021-08-11 DIAGNOSIS — I10 Essential (primary) hypertension: Secondary | ICD-10-CM | POA: Diagnosis not present

## 2021-08-11 LAB — POCT GLYCOSYLATED HEMOGLOBIN (HGB A1C): Hemoglobin A1C: 8.8 % — AB (ref 4.0–5.6)

## 2021-08-11 NOTE — Progress Notes (Signed)
Established patient visit   Patient: Connor [name]   DOB: Feb 05, 1961   60 y.o. Male  MRN: [id number] Visit Date: 08/11/2021  Today's healthcare provider: Lelon Huh, MD   No chief complaint on file.  Subjective    HPI  Diabetes Mellitus Type II, Follow-up  Lab Results  Component Value Date   HGBA1C 9.2 (A) 03/14/2021   HGBA1C 7.4 (H) 09/12/2020   HGBA1C 7.5 (A) 03/15/2020   Wt Readings from Last 3 Encounters:  03/14/21 197 lb 12.8 oz (89.7 kg)  09/12/20 200 lb (90.7 kg)  04/06/20 205 lb 12.8 oz (93.4 kg)   Last seen for diabetes 5 months ago. (03/14/21) Management since then includes begin canagliflozin (INVOKANA) 100 MG TABS tablet; Take 1 tablet (100 mg total) by mouth daily before breakfast. . He reports good compliance with treatment. He is not having side effects.  Symptoms: No fatigue No foot ulcerations  No appetite changes No nausea  No paresthesia of the feet  No polydipsia  No polyuria No visual disturbances   No vomiting     Home blood sugar records:  none  Episodes of hypoglycemia? No    Current insulin regiment: none Most Recent Eye Exam: UTD Current exercise: none Current diet habits: well balanced  Pertinent Labs: Lab Results  Component Value Date   CHOL 159 09/12/2020   HDL 42 09/12/2020   LDLCALC 98 09/12/2020   TRIG 101 09/12/2020   CHOLHDL 3.8 09/12/2020   Lab Results  Component Value Date   NA 140 09/12/2020   K 4.2 09/12/2020   CREATININE 1.23 09/12/2020   GFRNONAA 64 09/12/2020   GFRAA 74 09/12/2020   GLUCOSE 96 09/12/2020     ---------------------------------------------------------------------------------------------------  Hypertension, follow-up  BP Readings from Last 3 Encounters:  03/14/21 (!) 133/91  09/12/20 117/86  04/06/20 (!) 137/93   Wt Readings from Last 3 Encounters:  03/14/21 197 lb 12.8 oz (89.7 kg)  09/12/20 200 lb (90.7 kg)  04/06/20 205 lb 12.8 oz (93.4 kg)     He was last seen for  hypertension 5 months ago. (03/14/21) BP at that visit was 133/91. Management since that visit includes Expect improvement with addition of Invokana  .  He reports good compliance with treatment. He is not having side effects.  He is following a Regular diet. He is not exercising. He does not smoke.  Use of agents associated with hypertension: none.   Outside blood pressures are none Symptoms: No chest pain No chest pressure  No palpitations No syncope  No dyspnea No orthopnea  No paroxysmal nocturnal dyspnea No lower extremity edema   Pertinent labs: Lab Results  Component Value Date   NA 140 09/12/2020   K 4.2 09/12/2020   CREATININE 1.23 09/12/2020   GFRNONAA 64 09/12/2020   GFRAA 74 09/12/2020   GLUCOSE 96 09/12/2020     The 10-year ASCVD risk score (Arnett DK, et al., 2019) is: 24.3%   ---------------------------------------------------------------------------------------------------     Medications: Outpatient Medications Prior to Visit  Medication Sig   Alcohol Swabs (B-D SINGLE USE SWABS REGULAR) PADS CHECK  SUGAR  DAILY   aspirin EC 81 MG tablet Take 81 mg by mouth daily.   atorvastatin (LIPITOR) 40 MG tablet Take 1 tablet (40 mg total) by mouth daily.   Blood Glucose Monitoring Suppl (ACCU-CHEK AVIVA PLUS) w/Device KIT CHECK  SUGAR  DAILY   canagliflozin (INVOKANA) 100 MG TABS tablet Take 1 tablet (100 mg total) by mouth  daily before breakfast.   cyclobenzaprine (FLEXERIL) 10 MG tablet TAKE 1 TABLET THREE TIMES DAILY AS NEEDED FOR MUSCLE SPASMS.   docusate sodium (COLACE) 100 MG capsule Take 1 capsule (100 mg total) by mouth 2 (two) times daily. (Patient taking differently: Take 100 mg by mouth 2 (two) times daily as needed for mild constipation.)   glipiZIDE (GLUCOTROL XL) 10 MG 24 hr tablet TAKE 2 TABLETS EVERY DAY   hydrochlorothiazide (HYDRODIURIL) 25 MG tablet TAKE 1 TABLET EVERY DAY   lisinopril (ZESTRIL) 20 MG tablet TAKE 1 TABLET EVERY DAY   metFORMIN  (GLUCOPHAGE) 850 MG tablet Take 1 tablet (850 mg total) by mouth 2 (two) times daily with a meal.   Multiple Vitamin (MULTIVITAMIN WITH MINERALS) TABS tablet Take 1 tablet by mouth daily.   Omega-3 Fatty Acids (OMEGA 3 500 PO) Take 500 mg by mouth daily.   omeprazole (PRILOSEC) 40 MG capsule Take 1 capsule (40 mg total) by mouth daily as needed (for heartburn/indigestion.).   pioglitazone (ACTOS) 30 MG tablet Take 1 tablet (30 mg total) by mouth daily.   tadalafil (CIALIS) 20 MG tablet Take 20 mg by mouth daily as needed for erectile dysfunction. Reported on 04/23/2016   TRUE METRIX BLOOD GLUCOSE TEST test strip USE EVERY DAY FOR TYPE 2 DIABETES MELLITUS   TRUEplus Lancets 33G MISC USE EVERY DAY FOR TYPE 2 DIABETES MELLITUS   No facility-administered medications prior to visit.    Review of Systems     Objective    BP (!) 145/74   Pulse (!) 111   Temp 98.4 F (36.9 C) (Oral)   Ht _0  (1.676 m)   Wt 172 lb 12.8 oz (78.4 kg)   BMI 27.89 kg/m  {Show previous vital signs (optional):23777}  Physical Exam  General appearance:  Well developed, well nourished male, cooperative and in no acute distress Head: Normocephalic, without obvious abnormality, atraumatic Respiratory: Respirations even and unlabored, normal respiratory rate Extremities: All extremities are intact.  Skin: Skin color, texture, turgor normal. No rashes seen  Psych: Appropriate mood and affect. Neurologic: Mental status: Alert, oriented to person, place, and time, thought content appropriate.   Results for orders placed or performed in visit on 08/11/21  POCT HgB A1C  Result Value Ref Range   Hemoglobin A1C 8.8 (A) 4.0 - 5.6 %    Assessment & Plan     1. Diabetes mellitus without complication (HCC) Uncontrolled, but improving. Anticipate increasing Invokana if electrolytes are normal.   2. Mixed hyperlipidemia He is tolerating atorvastatin well with no adverse effects.   - TSH - Lipid panel  3. Primary  hypertension Stable, expect more BP reduction when Invokana dose in increased.  - Renal function panel - CBC  4. Need for influenza vaccination  - Flu Vaccine QUAD 36+ mos IM (Fluarix/Fluzone)   Future Appointments  Date Time Provider [place]  11/14/2021  4:00 PM Caryn Section Kirstie Peri, MD [facility]        The entirety of the information documented in the History of Present Illness, Review of Systems and Physical Exam were personally obtained by me. Portions of this information were initially documented by the CMA and reviewed by me for thoroughness and accuracy.     Lelon Huh, MD  [facility] [phone] (phone) [phone] (fax)  [place]

## 2021-08-11 NOTE — Patient Instructions (Addendum)
If your labs are normal, we will send in a prescription to increase the dose of Invokana to 300mg .

## 2021-08-12 LAB — RENAL FUNCTION PANEL
Albumin: 5.1 g/dL — ABNORMAL HIGH (ref 3.8–4.9)
BUN/Creatinine Ratio: 19 (ref 9–20)
BUN: 27 mg/dL — ABNORMAL HIGH (ref 6–24)
CO2: 21 mmol/L (ref 20–29)
Calcium: 10.2 mg/dL (ref 8.7–10.2)
Chloride: 97 mmol/L (ref 96–106)
Creatinine, Ser: 1.43 mg/dL — ABNORMAL HIGH (ref 0.76–1.27)
Glucose: 147 mg/dL — ABNORMAL HIGH (ref 65–99)
Phosphorus: 4.1 mg/dL (ref 2.8–4.1)
Potassium: 4.4 mmol/L (ref 3.5–5.2)
Sodium: 138 mmol/L (ref 134–144)
eGFR: 56 mL/min/{1.73_m2} — ABNORMAL LOW (ref >59)

## 2021-08-12 LAB — LIPID PANEL
Chol/HDL Ratio: 2.9 ratio (ref 0.0–5.0)
Cholesterol, Total: 159 mg/dL (ref 100–199)
HDL: 55 mg/dL (ref >39)
LDL Chol Calc (NIH): 88 mg/dL (ref 0–99)
Triglycerides: 88 mg/dL (ref 0–149)
VLDL Cholesterol Cal: 16 mg/dL (ref 5–40)

## 2021-08-12 LAB — CBC
Hematocrit: 40.9 % (ref 37.5–51.0)
Hemoglobin: 13.5 g/dL (ref 13.0–17.7)
MCH: 29 pg (ref 26.6–33.0)
MCHC: 33 g/dL (ref 31.5–35.7)
MCV: 88 fL (ref 79–97)
Platelets: 314 10*3/uL (ref 150–450)
RBC: 4.65 x10E6/uL (ref 4.14–5.80)
RDW: 13 % (ref 11.6–15.4)
WBC: 8.5 10*3/uL (ref 3.4–10.8)

## 2021-08-12 LAB — TSH: TSH: 1.22 u[IU]/mL (ref 0.450–4.500)

## 2021-08-14 ENCOUNTER — Other Ambulatory Visit: Payer: Self-pay | Admitting: Family Medicine

## 2021-08-14 DIAGNOSIS — E119 Type 2 diabetes mellitus without complications: Secondary | ICD-10-CM

## 2021-08-14 MED ORDER — CANAGLIFLOZIN 300 MG PO TABS
300.0000 mg | ORAL_TABLET | Freq: Every day | ORAL | 3 refills | Status: AC
Start: 2021-08-14 — End: ?

## 2021-09-01 ENCOUNTER — Encounter: Payer: Self-pay | Admitting: Emergency Medicine

## 2021-09-01 ENCOUNTER — Other Ambulatory Visit: Payer: Self-pay

## 2021-09-01 DIAGNOSIS — R42 Dizziness and giddiness: Secondary | ICD-10-CM | POA: Insufficient documentation

## 2021-09-01 DIAGNOSIS — W06XXXA Fall from bed, initial encounter: Secondary | ICD-10-CM | POA: Insufficient documentation

## 2021-09-01 DIAGNOSIS — Z7984 Long term (current) use of oral hypoglycemic drugs: Secondary | ICD-10-CM | POA: Diagnosis not present

## 2021-09-01 DIAGNOSIS — Z7982 Long term (current) use of aspirin: Secondary | ICD-10-CM | POA: Insufficient documentation

## 2021-09-01 DIAGNOSIS — R000 Tachycardia, unspecified: Secondary | ICD-10-CM | POA: Diagnosis not present

## 2021-09-01 DIAGNOSIS — I10 Essential (primary) hypertension: Secondary | ICD-10-CM | POA: Diagnosis not present

## 2021-09-01 DIAGNOSIS — E860 Dehydration: Secondary | ICD-10-CM | POA: Insufficient documentation

## 2021-09-01 DIAGNOSIS — E119 Type 2 diabetes mellitus without complications: Secondary | ICD-10-CM | POA: Insufficient documentation

## 2021-09-01 DIAGNOSIS — S0091XA Abrasion of unspecified part of head, initial encounter: Secondary | ICD-10-CM | POA: Insufficient documentation

## 2021-09-01 DIAGNOSIS — Z79899 Other long term (current) drug therapy: Secondary | ICD-10-CM | POA: Diagnosis not present

## 2021-09-01 DIAGNOSIS — Y9389 Activity, other specified: Secondary | ICD-10-CM | POA: Insufficient documentation

## 2021-09-01 DIAGNOSIS — S0990XA Unspecified injury of head, initial encounter: Secondary | ICD-10-CM | POA: Diagnosis present

## 2021-09-01 LAB — URINALYSIS, COMPLETE (UACMP) WITH MICROSCOPIC
Bacteria, UA: NONE SEEN
Bilirubin Urine: NEGATIVE
Glucose, UA: 50 mg/dL — AB
Ketones, ur: 5 mg/dL — AB
Leukocytes,Ua: NEGATIVE
Nitrite: NEGATIVE
Protein, ur: NEGATIVE mg/dL
Specific Gravity, Urine: 1.018 (ref 1.005–1.030)
Squamous Epithelial / HPF: NONE SEEN (ref 0–5)
pH: 5 (ref 5.0–8.0)

## 2021-09-01 LAB — CBC
HCT: 41.6 % (ref 39.0–52.0)
Hemoglobin: 14.5 g/dL (ref 13.0–17.0)
MCH: 30.5 pg (ref 26.0–34.0)
MCHC: 34.9 g/dL (ref 30.0–36.0)
MCV: 87.4 fL (ref 80.0–100.0)
Platelets: 337 10*3/uL (ref 150–400)
RBC: 4.76 MIL/uL (ref 4.22–5.81)
RDW: 12 % (ref 11.5–15.5)
WBC: 8.5 10*3/uL (ref 4.0–10.5)
nRBC: 0 % (ref 0.0–0.2)

## 2021-09-01 LAB — BASIC METABOLIC PANEL
Anion gap: 14 (ref 5–15)
BUN: 54 mg/dL — ABNORMAL HIGH (ref 6–20)
CO2: 24 mmol/L (ref 22–32)
Calcium: 9.9 mg/dL (ref 8.9–10.3)
Chloride: 96 mmol/L — ABNORMAL LOW (ref 98–111)
Creatinine, Ser: 1.81 mg/dL — ABNORMAL HIGH (ref 0.61–1.24)
GFR, Estimated: 43 mL/min — ABNORMAL LOW (ref >60)
Glucose, Bld: 136 mg/dL — ABNORMAL HIGH (ref 70–99)
Potassium: 4.2 mmol/L (ref 3.5–5.1)
Sodium: 134 mmol/L — ABNORMAL LOW (ref 135–145)

## 2021-09-01 LAB — CBG MONITORING, ED: Glucose-Capillary: 122 mg/dL — ABNORMAL HIGH (ref 70–99)

## 2021-09-01 NOTE — ED Triage Notes (Signed)
Pt brought into ED via EMS with co a fall, he hit the left side of head has swelling and under left eye also. Denies any blood thinners. Did not have LOC. 113/85, 91 HR, 99% RA 122 CBG. Pt states that he had slippery socks on and he fell getting out of bed. He remembers the fall.

## 2021-09-01 NOTE — ED Triage Notes (Addendum)
Pt here after he reports he slipped on the wet bathroom floor and fell. Small knot above left eye.  No blood thinners.  Denies LOC.  Reports frequent falls recently; admits to seeing pcp for this but reports they did not tell him why.  Has c/o feeling weak and not eating today. No pain or fevers. Is diabetic. CBG in 122 triage. 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 Pt alert and oriented X4.  Speech baseline per pt.

## 2021-09-02 ENCOUNTER — Emergency Department
Admission: EM | Admit: 2021-09-02 | Discharge: 2021-09-02 | Disposition: A | Discharge status: Home / Self Care | Payer: Medicare HMO | Attending: Emergency Medicine | Admitting: Emergency Medicine

## 2021-09-02 ENCOUNTER — Emergency Department: Payer: Medicare HMO

## 2021-09-02 ENCOUNTER — Encounter: Payer: Self-pay | Admitting: Radiology

## 2021-09-02 DIAGNOSIS — R42 Dizziness and giddiness: Secondary | ICD-10-CM

## 2021-09-02 DIAGNOSIS — E860 Dehydration: Secondary | ICD-10-CM

## 2021-09-02 DIAGNOSIS — W19XXXA Unspecified fall, initial encounter: Secondary | ICD-10-CM

## 2021-09-02 LAB — TROPONIN I (HIGH SENSITIVITY): Troponin I (High Sensitivity): 4 ng/L (ref <18)

## 2021-09-02 MED ORDER — SODIUM CHLORIDE 0.9 % IV BOLUS
1000.0000 mL | Freq: Once | INTRAVENOUS | Status: AC
  Administered 2021-09-02: 1000 mL via INTRAVENOUS

## 2021-09-02 NOTE — ED Notes (Signed)
Dr. Sung at bedside.  

## 2021-09-02 NOTE — ED Provider Notes (Signed)
[facility] Emergency Department Provider Note   ____________________________________________   Event Date/Time   First MD Initiated Contact with Patient 09/02/21 [phone]     (approximate)  I have reviewed the triage vital signs and the nursing notes.   HISTORY  Chief Complaint Fall    HPI Connor [name] is a 60 y.o. male brought to the ED via EMS from home status post mechanical fall.  Patient states he was wearing slippery socks and he fell while getting out of bed.  Struck the left side of his head.  Denies LOC.  Does not take anticoagulants.  Reports frequent falls recently with dizziness.  Has seen his PCP but does not know why he has been dizzy.  Endorses generalized weakness.  Denies fever, cough, chest pain, shortness of breath, abdominal pain, nausea or vomiting      Past Medical History:  Diagnosis Date   Arthritis    cervical spondylosis, myelopathy    GERD (gastroesophageal reflux disease)    Hyperlipidemia    Hypertension    Lazy eye, left    Dr Annamaria Boots, [place]    Patient Active Problem List   Diagnosis Date Noted   Cervical spondylosis with myelopathy 01/21/2017   ALS (amyotrophic lateral sclerosis) ([place]) 09/07/2016   Cervical spinal stenosis 04/23/2016   History of adenomatous polyp of colon 01/04/2016   Diabetes mellitus without complication ([place]) 76/80/8811   Hypertension 10/06/2015   History of methicillin resistant Staphylococcus aureus infection 03/31/2013   HLD (hyperlipidemia) 01/02/2007   Allergic rhinitis 02/01/2006   ED (erectile dysfunction) of organic origin 12/27/2004   Gastro-esophageal reflux disease without esophagitis 06/08/2002    Past Surgical History:  Procedure Laterality Date   ANTERIOR CERVICAL DECOMPRESSION/DISCECTOMY FUSION 4 LEVELS N/A 01/21/2017   Procedure: ANTERIOR CERVICAL DECOMPRESSION/DISCECTOMY FUSION , INTERBODY PROSTHESIS,PLATE CERVICAL THREE- CERVICAL FOUR,CERVICAL FOUR- CERVICAL FIVE, CERVICAL  FIVE- CERVICAL SIX, CERVICAL SIX- CERVICAL SEVEN;  Surgeon: Newman Pies, MD;  Location: [place];  Service: Neurosurgery;  Laterality: N/A;   CARPAL TUNNEL RELEASE Right 01/13/2018   Procedure: CARPAL TUNNEL RELEASE RIGHT;  Surgeon: Newman Pies, MD;  Location: [place];  Service: Neurosurgery;  Laterality: Right;   COLONOSCOPY WITH PROPOFOL N/A 08/30/2015   Procedure: COLONOSCOPY WITH PROPOFOL;  Surgeon: Lucilla Lame, MD;  Location: [facility];  Service: Endoscopy;  Laterality: N/A;   LEVATOR RESECTION Left 10/21/2020   Repair of blepharoptosis;([place]) levator resection - Yvetta Coder, MD [facility]   STRABISMUS SURGERY Bilateral 07/10/2019   Procedure: STRABISMUS REPAIR BOTH EYES;  Surgeon: Everitt Amber, MD;  Location: [place];  Service: Ophthalmology;  Laterality: Bilateral;    Prior to Admission medications   Medication Sig Start Date End Date Taking? Authorizing Provider  Alcohol Swabs (B-D SINGLE USE SWABS REGULAR) PADS CHECK  SUGAR  DAILY 08/07/18   Carmon Ginsberg, PA  aspirin EC 81 MG tablet Take 81 mg by mouth daily.    [provider]  atorvastatin (LIPITOR) 40 MG tablet Take 1 tablet (40 mg total) by mouth daily. 10/31/19   Birdie Sons, MD  Blood Glucose Monitoring Suppl (ACCU-CHEK AVIVA PLUS) w/Device KIT CHECK  SUGAR  DAILY 11/30/17   Carmon Ginsberg, PA  canagliflozin [facility]) 300 MG TABS tablet Take 1 tablet (300 mg total) by mouth daily before breakfast. 08/14/21   Birdie Sons, MD  cyclobenzaprine (FLEXERIL) 10 MG tablet TAKE 1 TABLET THREE TIMES DAILY AS NEEDED FOR MUSCLE SPASMS. 02/02/21   Birdie Sons, MD  docusate  sodium (COLACE) 100 MG capsule Take 1 capsule (100 mg total) by mouth 2 (two) times daily. Patient taking differently: Take 100 mg by mouth 2 (two) times daily as needed for mild constipation. 01/22/17   Newman Pies, MD  glipiZIDE (GLUCOTROL XL) 10 MG 24 hr tablet TAKE 2 TABLETS EVERY DAY 01/05/21   Birdie Sons, MD  hydrochlorothiazide (HYDRODIURIL) 25 MG tablet TAKE 1 TABLET EVERY DAY 05/16/21   Birdie Sons, MD  lisinopril (ZESTRIL) 20 MG tablet TAKE 1 TABLET EVERY DAY 05/16/21   Birdie Sons, MD  metFORMIN (GLUCOPHAGE) 850 MG tablet Take 1 tablet (850 mg total) by mouth 2 (two) times daily with a meal. 05/04/21 07/28/22  Birdie Sons, MD  Multiple Vitamin (MULTIVITAMIN WITH MINERALS) TABS tablet Take 1 tablet by mouth daily.    [provider]  Omega-3 Fatty Acids (OMEGA 3 500 PO) Take 500 mg by mouth daily.    [provider]  omeprazole (PRILOSEC) 40 MG capsule Take 1 capsule (40 mg total) by mouth daily as needed (for heartburn/indigestion.). 05/17/21   Birdie Sons, MD  pioglitazone (ACTOS) 30 MG tablet Take 1 tablet (30 mg total) by mouth daily. 10/31/19   Birdie Sons, MD  tadalafil (CIALIS) 20 MG tablet Take 20 mg by mouth daily as needed for erectile dysfunction. Reported on 04/23/2016 12/23/13   [provider]  TRUE METRIX BLOOD GLUCOSE TEST test strip USE EVERY DAY FOR TYPE 2 DIABETES MELLITUS 02/24/21   Birdie Sons, MD  TRUEplus Lancets 33G MISC USE EVERY DAY FOR TYPE 2 DIABETES MELLITUS 04/11/21   Birdie Sons, MD    Allergies No known allergies  Family History  Problem Relation Age of Onset   Diabetes Mother    Hyperlipidemia Mother    Hypertension Mother    Diabetes Father    Hypertension Father    Hyperlipidemia Father     Social History Social History   Tobacco Use   Smoking status: Never   Smokeless tobacco: Never  Vaping Use   Vaping Use: Never used  Substance Use Topics   Alcohol use: No    Alcohol/week: 0.0 standard drinks   Drug use: No    Review of Systems  Constitutional: Positive for generalized weakness.  No fever/chills Eyes: No visual changes. ENT: No sore throat. Cardiovascular: Denies chest pain. Respiratory: Denies shortness of breath. Gastrointestinal: No abdominal pain.  No nausea, no  vomiting.  No diarrhea.  No constipation. Genitourinary: Negative for dysuria. Musculoskeletal: Negative for back pain. Skin: Negative for rash. Neurological: Positive for dizziness.  Negative for headaches, focal weakness or numbness.   ____________________________________________   PHYSICAL EXAM:  VITAL SIGNS: ED Triage Vitals  Enc Vitals Group     BP 09/01/21 1908 114/80     Pulse Rate 09/01/21 1906 (!) 113     Resp 09/01/21 1906 16     Temp 09/01/21 1906 98.1 F (36.7 C)     Temp Source 09/01/21 1906 Oral     SpO2 09/01/21 1906 100 %     Weight 09/01/21 1906 170 lb (77.1 kg)     Height 09/01/21 1906 _0  (1.753 m)     Head Circumference --      Peak Flow --      Pain Score 09/01/21 1906 0     Pain Loc --      Pain Edu? --      Excl. in [place]? --  Constitutional: Alert and oriented. Well appearing and in no acute distress. Eyes: Conjunctivae are normal. PERRL. EOMI. Chronic lazy left eye Head: Left head abrasion. Nose: Atraumatic. Mouth/Throat: Mucous membranes are mildly dry.   Neck: No stridor.  No cervical spine tenderness to palpation. Cardiovascular: Tachycardic rate, regular rhythm. Grossly normal heart sounds.  Good peripheral circulation. Respiratory: Normal respiratory effort.  No retractions. Lungs CTAB. Gastrointestinal: Soft and nontender to light or deep palpation. No distention. No abdominal bruits. No CVA tenderness. Musculoskeletal: No lower extremity tenderness nor edema.  No joint effusions. Neurologic: Alert and oriented x3.  CN II XII grossly intact.  Normal speech and language. No gross focal neurologic deficits are appreciated.  Skin:  Skin is warm, dry and intact. No rash noted. Psychiatric: Mood and affect are normal. Speech and behavior are normal.  ____________________________________________   LABS (all labs ordered are listed, but only abnormal results are displayed)  Labs Reviewed  BASIC METABOLIC PANEL - Abnormal; Notable for the  following components:      Result Value   Sodium 134 (*)    Chloride 96 (*)    Glucose, Bld 136 (*)    BUN 54 (*)    Creatinine, Ser 1.81 (*)    GFR, Estimated 43 (*)    All other components within normal limits  URINALYSIS, COMPLETE (UACMP) WITH MICROSCOPIC - Abnormal; Notable for the following components:   Color, Urine YELLOW (*)    APPearance CLEAR (*)    Glucose, UA 50 (*)    Hgb urine dipstick MODERATE (*)    Ketones, ur 5 (*)    All other components within normal limits  CBG MONITORING, ED - Abnormal; Notable for the following components:   Glucose-Capillary 122 (*)    All other components within normal limits  CBC  TROPONIN I (HIGH SENSITIVITY)   ____________________________________________  EKG  ED ECG REPORT I, Ammie Warrick J, the attending physician, personally viewed and interpreted this ECG.   Date: 09/02/2021  EKG Time: 1921  Rate: 108  Rhythm: sinus tachycardia  Axis: LAD  Intervals:none  ST&T Change: Nonspecific  ____________________________________________  RADIOLOGY I, Antoneo Ghrist J, personally viewed and evaluated these images (plain radiographs) as part of my medical decision making, as well as reviewing the written report by the radiologist.  ED MD interpretation: No ICH; no acute abnormalities on MRI brain  Official radiology report(s): CT HEAD WO CONTRAST (5MM)  Result Date: 09/02/2021 CLINICAL DATA:  Recent slip and fall with headaches, initial encounter EXAM: CT HEAD WITHOUT CONTRAST TECHNIQUE: Contiguous axial images were obtained from the base of the skull through the vertex without intravenous contrast. COMPARISON:  09/02/2021 FINDINGS: Brain: No evidence of acute infarction, hemorrhage, hydrocephalus, extra-axial collection or mass lesion/mass effect. Vascular: No hyperdense vessel or unexpected calcification. Skull: Normal. Negative for fracture or focal lesion. Sinuses/Orbits: No acute finding. Other: None. IMPRESSION: No acute intracranial  abnormality noted. Electronically Signed   By: Inez Catalina M.D.   On: 09/02/2021 01:20   MR BRAIN WO CONTRAST  Result Date: 09/02/2021 CLINICAL DATA:  Frequent falls. EXAM: MRI HEAD WITHOUT CONTRAST TECHNIQUE: Multiplanar, multiecho pulse sequences of the brain and surrounding structures were obtained without intravenous contrast. COMPARISON:  01/24/2016 FINDINGS: Brain: No acute infarction, hemorrhage, hydrocephalus, extra-axial collection or mass lesion. New white matter disease with notable involvement of peripheral temporal white matter. No associated cystic changes or chronic infarcts. There are multiple small-vessel risk factors per the chart. Indistinct pontine T2 hyperintensity with asymmetric involvement the left cortical spinal  radiations near the internal capsule and cerebral peduncle, also highlighted previously and possibly related to history of ALS. No associated swelling or volume loss. Mild decreased brain volume since prior. Vascular: Normal flow voids. Skull and upper cervical spine: Normal marrow signal. Sinuses/Orbits: Retention cysts in the right maxillary and ethmoid sinuses. IMPRESSION: 1. No acute or posttraumatic finding. 2. White matter disease since 2017 presumably related to small-vessel risk factors although unusually temporal predominant. Electronically Signed   By: Jorje Guild M.D.   On: 09/02/2021 05:33    ____________________________________________   PROCEDURES  Procedure(s) performed (including Critical Care):  .1-3 Lead EKG Interpretation Performed by: Paulette Blanch, MD Authorized by: Paulette Blanch, MD     Interpretation: abnormal     ECG rate:  108   ECG rate assessment: tachycardic     Rhythm: sinus tachycardia     Ectopy: none     Conduction: normal   Comments:     Patient placed on cardiac monitor to evaluate for arrhythmias   ____________________________________________   INITIAL IMPRESSION / ASSESSMENT AND PLAN / ED COURSE  As part of my  medical decision making, I reviewed the following data within the [place] notes reviewed and incorporated, Labs reviewed, EKG interpreted, Old chart reviewed, Radiograph reviewed, and Notes from prior ED visits     60 year old male who presents status post mechanical fall but with history of dizziness, frequent falls and generalized weakness.  Differential diagnosis includes but is not limited to CVA, TIA, ACS, infectious, electrolyte abnormalities, etc.  Laboratory results demonstrate AKI.  Will obtain MRI brain to evaluate dizziness.  Initiate IV fluid resuscitation.  Check troponin.  Clinical Course as of 09/02/21 2703  Sat Sep 02, 2021  5009 Awaken patient from sleep to update him of MRI result.  Strict return precautions given.  Patient verbalizes understanding and agrees with plan of care. [JS]    Clinical Course User Index [JS] Paulette Blanch, MD     ____________________________________________   FINAL CLINICAL IMPRESSION(S) / ED DIAGNOSES  Final diagnoses:  Fall, initial encounter  Dizziness  Dehydration     ED Discharge Orders     None        Note:  This document was prepared using Dragon voice recognition software and may include unintentional dictation errors.    Paulette Blanch, MD 09/02/21 0700

## 2021-09-02 NOTE — ED Notes (Signed)
Pt to MRI

## 2021-09-02 NOTE — Discharge Instructions (Addendum)
Drink plenty of fluids daily.  Return to the ER for worsening symptoms, persistent vomiting, difficulty breathing or other concerns. °

## 2021-09-14 ENCOUNTER — Ambulatory Visit: Payer: Medicare HMO | Admitting: Neurology

## 2021-10-08 ENCOUNTER — Other Ambulatory Visit: Payer: Self-pay | Admitting: Family Medicine

## 2021-10-08 DIAGNOSIS — I10 Essential (primary) hypertension: Secondary | ICD-10-CM

## 2021-11-01 ENCOUNTER — Telehealth: Payer: Self-pay | Admitting: Family Medicine

## 2021-11-01 NOTE — Telephone Encounter (Signed)
Home Health Verbal Orders - Caller/Agency:patient [facility] Callback Number:[phone] Requesting requesting sitter/ Frequency: ?

## 2021-11-01 NOTE — Telephone Encounter (Signed)
That's fine

## 2021-11-01 NOTE — Telephone Encounter (Signed)
Tried calling "Call cannot be completed as dialed"   Will try again later.   Thanks,   -Vernona Rieger

## 2021-11-06 ENCOUNTER — Telehealth: Payer: Self-pay

## 2021-11-06 NOTE — Telephone Encounter (Signed)
Opened in error

## 2021-11-07 NOTE — Telephone Encounter (Signed)
Attempted call, na.

## 2021-11-14 ENCOUNTER — Ambulatory Visit: Payer: Medicare HMO | Admitting: Family Medicine

## 2021-12-04 ENCOUNTER — Telehealth: Payer: Self-pay | Admitting: Family Medicine

## 2021-12-04 NOTE — Telephone Encounter (Signed)
Copied from CRM [id number]. Topic: Medicare AWV >> Dec 04, 2021 11:35 AM Claudette Laws R wrote: Reason for CRM:  No answer unable to leave a message for patient to call back and schedule Medicare Annual Wellness Visit (AWV) in office.   If not able to come in office, please offer to do virtually or by telephone.   Last AWV: 03/15/2020  Please schedule at anytime with [facility] Health Advisor.  If any questions, please contact me at [phone]

## 2022-01-09 ENCOUNTER — Ambulatory Visit: Payer: Medicare HMO | Admitting: Family Medicine

## 2022-01-09 NOTE — Progress Notes (Unsigned)
°  ° ° °Established patient visit ° ° °Patient: Connor [name]   DOB: 02/24/1961   61 y.o. Male  MRN: [id number] °Visit Date: 01/09/2022 ° °Today's healthcare provider: Donald Fisher, MD  ° °No chief complaint on file. ° °Subjective  °  °HPI  °Hypertension, follow-up ° °BP Readings from Last 3 Encounters:  °09/02/21 110/89  °08/11/21 (!) 145/74  °03/14/21 (!) 133/91  ° Wt Readings from Last 3 Encounters:  °09/01/21 170 lb (77.1 kg)  °08/11/21 172 lb 12.8 oz (78.4 kg)  °03/14/21 197 lb 12.8 oz (89.7 kg)  °  ° °He was last seen for hypertension 5 months ago.  °BP at that visit was 145/74. Management since that visit includes; Stable, expect more BP reduction when Invokana dose in increased.  ° °He reports {excellent/good/fair/poor:19665} compliance with treatment. °He {is/is not:9024} having side effects. {document side effects if present:1} °He is following a {diet:21022986} diet. °He {is/is not:9024} exercising. °He {does/does not:200015} smoke. ° °Use of agents associated with hypertension: {bp agents assoc with hypertension:511::"none"}.  ° °Outside blood pressures are {***enter patient reported home BP readings, or 'not being checked':1}. ° °Pertinent labs: °Lab Results  °Component Value Date  ° CHOL 159 08/11/2021  ° HDL 55 08/11/2021  ° LDLCALC 88 08/11/2021  ° TRIG 88 08/11/2021  ° CHOLHDL 2.9 08/11/2021  ° Lab Results  °Component Value Date  ° NA 134 (L) 09/01/2021  ° K 4.2 09/01/2021  ° CREATININE 1.81 (H) 09/01/2021  ° GFRNONAA 43 (L) 09/01/2021  ° GLUCOSE 136 (H) 09/01/2021  ° TSH 1.220 08/11/2021  °  ° °The 10-year ASCVD risk score (Arnett DK, et al., 2019) is: 14.3%  ° °--------------------------------------------------------------------------------------------------- °Diabetes Mellitus Type II, Follow-up ° °Lab Results  °Component Value Date  ° HGBA1C 8.8 (A) 08/11/2021  ° HGBA1C 9.2 (A) 03/14/2021  ° HGBA1C 7.4 (H) 09/12/2020  ° °Wt Readings from Last 3 Encounters:  °09/01/21 170 lb (77.1 kg)  °08/11/21  172 lb 12.8 oz (78.4 kg)  °03/14/21 197 lb 12.8 oz (89.7 kg)  ° °Last seen for diabetes 5 months ago.  °Management since then includes; Uncontrolled, but improving. Anticipate increasing Invokana if electrolytes are normal.  ° °He reports {excellent/good/fair/poor:19665} compliance with treatment. °He {is/is not:21021397} having side effects. {document side effects if present:1} ° ° °Home blood sugar records: {diabetes glucometry results:16657} ° °Episodes of hypoglycemia? {Yes/No:20286} {enter symptoms and frequency of symptoms if yes:1} ° °Most Recent Eye Exam: *** ° ° °Pertinent Labs: °Lab Results  °Component Value Date  ° CHOL 159 08/11/2021  ° HDL 55 08/11/2021  ° LDLCALC 88 08/11/2021  ° TRIG 88 08/11/2021  ° CHOLHDL 2.9 08/11/2021  ° Lab Results  °Component Value Date  ° NA 134 (L) 09/01/2021  ° K 4.2 09/01/2021  ° CREATININE 1.81 (H) 09/01/2021  ° GFRNONAA 43 (L) 09/01/2021  °  ° °--------------------------------------------------------------------------------------------------- ° °Medications: °Outpatient Medications Prior to Visit  °Medication Sig  ° Alcohol Swabs (B-D SINGLE USE SWABS REGULAR) PADS CHECK  SUGAR  DAILY  ° aspirin EC 81 MG tablet Take 81 mg by mouth daily.  ° atorvastatin (LIPITOR) 40 MG tablet Take 1 tablet (40 mg total) by mouth daily.  ° Blood Glucose Monitoring Suppl (ACCU-CHEK AVIVA PLUS) w/Device KIT CHECK  SUGAR  DAILY  ° canagliflozin (INVOKANA) 300 MG TABS tablet Take 1 tablet (300 mg total) by mouth daily before breakfast.  ° cyclobenzaprine (FLEXERIL) 10 MG tablet TAKE 1 TABLET THREE TIMES DAILY AS NEEDED FOR MUSCLE SPASMS.  °   SPASMS.   docusate sodium (COLACE) 100 MG capsule Take 1 capsule (100 mg total) by mouth 2 (two) times daily. (Patient taking differently: Take 100 mg by mouth 2 (two) times daily as needed for mild constipation.)   glipiZIDE (GLUCOTROL XL) 10 MG 24 hr tablet TAKE 2 TABLETS EVERY DAY   hydrochlorothiazide (HYDRODIURIL) 25 MG tablet TAKE 1 TABLET EVERY DAY    lisinopril (ZESTRIL) 20 MG tablet TAKE 1 TABLET EVERY DAY   metFORMIN (GLUCOPHAGE) 850 MG tablet Take 1 tablet (850 mg total) by mouth 2 (two) times daily with a meal.   Multiple Vitamin (MULTIVITAMIN WITH MINERALS) TABS tablet Take 1 tablet by mouth daily.   Omega-3 Fatty Acids (OMEGA 3 500 PO) Take 500 mg by mouth daily.   omeprazole (PRILOSEC) 40 MG capsule Take 1 capsule (40 mg total) by mouth daily as needed (for heartburn/indigestion.).   pioglitazone (ACTOS) 30 MG tablet Take 1 tablet (30 mg total) by mouth daily.   tadalafil (CIALIS) 20 MG tablet Take 20 mg by mouth daily as needed for erectile dysfunction. Reported on 04/23/2016   TRUE METRIX BLOOD GLUCOSE TEST test strip USE EVERY DAY FOR TYPE 2 DIABETES MELLITUS   TRUEplus Lancets 33G MISC USE EVERY DAY FOR TYPE 2 DIABETES MELLITUS   No facility-administered medications prior to visit.    Review of Systems  All other systems reviewed and are negative.  {Labs   Heme   Chem   Endocrine   Serology   Results Review (optional):23779}   Objective    There were no vitals taken for this visit. {Show previous vital signs (optional):23777}  Physical Exam  ***  No results found for any visits on 01/09/22.  Assessment & Plan     ***  No follow-ups on file.      {provider attestation***:1}   Lelon Huh, MD  [facility] [phone] (phone) [phone] (fax)  [place]

## 2022-03-02 ENCOUNTER — Telehealth: Payer: Self-pay | Admitting: Family Medicine

## 2022-03-02 NOTE — Telephone Encounter (Signed)
Copied from CRM [id number]. Topic: Medicare AWV ?>> Mar 02, 2022  9:56 AM Claudette Laws R wrote: ?Reason for CRM:  ?Left message for patient to call back and schedule Medicare Annual Wellness Visit (AWV) in office.  ? ?If unable to come into the office for AWV,  please offer to do virtually or by telephone. ? ?Last AWV: : 03/15/2020 ? ?Please schedule at anytime with [facility] Health Advisor. ? ?30 minute appointment for Virtual or phone ?45 minute appointment for in office or Initial virtual/phone ? ?Any questions, please contact me at [phone] ?

## 2022-03-19 ENCOUNTER — Emergency Department
Admission: EM | Admit: 2022-03-19 | Discharge: 2022-03-19 | Disposition: A | Discharge status: Home / Self Care | Attending: Emergency Medicine | Admitting: Emergency Medicine

## 2022-03-19 ENCOUNTER — Emergency Department

## 2022-03-19 DIAGNOSIS — R4182 Altered mental status, unspecified: Secondary | ICD-10-CM | POA: Diagnosis not present

## 2022-03-19 DIAGNOSIS — S0990XA Unspecified injury of head, initial encounter: Secondary | ICD-10-CM

## 2022-03-19 DIAGNOSIS — Y92002 Bathroom of unspecified non-institutional (private) residence single-family (private) house as the place of occurrence of the external cause: Secondary | ICD-10-CM | POA: Insufficient documentation

## 2022-03-19 DIAGNOSIS — E119 Type 2 diabetes mellitus without complications: Secondary | ICD-10-CM | POA: Diagnosis not present

## 2022-03-19 DIAGNOSIS — D649 Anemia, unspecified: Secondary | ICD-10-CM | POA: Insufficient documentation

## 2022-03-19 DIAGNOSIS — I10 Essential (primary) hypertension: Secondary | ICD-10-CM | POA: Diagnosis not present

## 2022-03-19 DIAGNOSIS — W01198A Fall on same level from slipping, tripping and stumbling with subsequent striking against other object, initial encounter: Secondary | ICD-10-CM | POA: Insufficient documentation

## 2022-03-19 DIAGNOSIS — R000 Tachycardia, unspecified: Secondary | ICD-10-CM | POA: Insufficient documentation

## 2022-03-19 DIAGNOSIS — S0101XA Laceration without foreign body of scalp, initial encounter: Secondary | ICD-10-CM | POA: Diagnosis not present

## 2022-03-19 DIAGNOSIS — R4781 Slurred speech: Secondary | ICD-10-CM | POA: Diagnosis not present

## 2022-03-19 DIAGNOSIS — Z23 Encounter for immunization: Secondary | ICD-10-CM | POA: Insufficient documentation

## 2022-03-19 LAB — COMPREHENSIVE METABOLIC PANEL
ALT: 16 U/L (ref 0–44)
AST: 24 U/L (ref 15–41)
Albumin: 3.8 g/dL (ref 3.5–5.0)
Alkaline Phosphatase: 55 U/L (ref 38–126)
Anion gap: 9 (ref 5–15)
BUN: 26 mg/dL — ABNORMAL HIGH (ref 6–20)
CO2: 26 mmol/L (ref 22–32)
Calcium: 9 mg/dL (ref 8.9–10.3)
Chloride: 103 mmol/L (ref 98–111)
Creatinine, Ser: 0.98 mg/dL (ref 0.61–1.24)
GFR, Estimated: >60 mL/min (ref >60)
Glucose, Bld: 160 mg/dL — ABNORMAL HIGH (ref 70–99)
Potassium: 4.2 mmol/L (ref 3.5–5.1)
Sodium: 138 mmol/L (ref 135–145)
Total Bilirubin: 0.5 mg/dL (ref 0.3–1.2)
Total Protein: 7.5 g/dL (ref 6.5–8.1)

## 2022-03-19 LAB — CBC WITH DIFFERENTIAL/PLATELET
Abs Immature Granulocytes: 0.01 10*3/uL (ref 0.00–0.07)
Basophils Absolute: 0 10*3/uL (ref 0.0–0.1)
Basophils Relative: 1 %
Eosinophils Absolute: 0.1 10*3/uL (ref 0.0–0.5)
Eosinophils Relative: 1 %
HCT: 36.8 % — ABNORMAL LOW (ref 39.0–52.0)
Hemoglobin: 12.1 g/dL — ABNORMAL LOW (ref 13.0–17.0)
Immature Granulocytes: 0 %
Lymphocytes Relative: 42 %
Lymphs Abs: 3 10*3/uL (ref 0.7–4.0)
MCH: 27.6 pg (ref 26.0–34.0)
MCHC: 32.9 g/dL (ref 30.0–36.0)
MCV: 83.8 fL (ref 80.0–100.0)
Monocytes Absolute: 0.4 10*3/uL (ref 0.1–1.0)
Monocytes Relative: 5 %
Neutro Abs: 3.6 10*3/uL (ref 1.7–7.7)
Neutrophils Relative %: 51 %
Platelets: 293 10*3/uL (ref 150–400)
RBC: 4.39 MIL/uL (ref 4.22–5.81)
RDW: 13.8 % (ref 11.5–15.5)
WBC: 7 10*3/uL (ref 4.0–10.5)
nRBC: 0 % (ref 0.0–0.2)

## 2022-03-19 LAB — CBG MONITORING, ED: Glucose-Capillary: 170 mg/dL — ABNORMAL HIGH (ref 70–99)

## 2022-03-19 MED ORDER — TETANUS-DIPHTH-ACELL PERTUSSIS 5-2.5-18.5 LF-MCG/0.5 IM SUSY
0.5000 mL | PREFILLED_SYRINGE | Freq: Once | INTRAMUSCULAR | Status: AC
  Administered 2022-03-19: 0.5 mL via INTRAMUSCULAR
  Filled 2022-03-19: qty 0.5

## 2022-03-19 MED ORDER — CEFAZOLIN SODIUM-DEXTROSE 1-4 GM/50ML-% IV SOLN
1.0000 g | Freq: Once | INTRAVENOUS | Status: AC
  Administered 2022-03-19: 1 g via INTRAVENOUS
  Filled 2022-03-19: qty 50

## 2022-03-19 MED ORDER — SODIUM CHLORIDE 0.9 % IV BOLUS
1000.0000 mL | Freq: Once | INTRAVENOUS | Status: AC
Start: 2022-03-19 — End: 2022-03-19
  Administered 2022-03-19: 1000 mL via INTRAVENOUS

## 2022-03-19 MED ORDER — LIDOCAINE-EPINEPHRINE 2 %-1:100000 IJ SOLN
10.0000 mL | Freq: Once | INTRAMUSCULAR | Status: AC
  Administered 2022-03-19: 10 mL
  Filled 2022-03-19: qty 10

## 2022-03-19 MED ORDER — LIDOCAINE HCL (PF) 1 % IJ SOLN
INTRAMUSCULAR | Status: AC
  Filled 2022-03-19: qty 5

## 2022-03-19 NOTE — ED Notes (Signed)
Called [facility] for transfer to home  [address] [place] [address] ?

## 2022-03-19 NOTE — ED Notes (Signed)
Called two times and told he would be next, patient waiting for [facility] to transport.   ?

## 2022-03-19 NOTE — Discharge Instructions (Addendum)
Keep the wound clean and dry.  He had his stitches taken out in about 7 days.  We can do that here your doctor may be able to do it.  Ask him to make sure first.  Please return for any further problems.  Please return for any signs of infection like increasing pain redness swelling or pussy drainage. ? ?Please also make sure that your doctor follows your blood count make sure you are not too anemic.  It has dropped a little bit since the last time we checked it.  Possibly this is at least partly due to blood loss from the head laceration.  It was bleeding very briskly.  ?

## 2022-03-19 NOTE — ED Provider Notes (Addendum)
? ?[facility] ?Provider Note ? ? ? Event Date/Time  ? First MD Initiated Contact with Patient 03/19/22 [phone]   ?  (approximate) ? ? ?History  ? ?Head Injury (EMS dispatched by patient's Alert bracelet; Patient was found lying on the floor in the bathroom with a bleeding head wound, altered mental status, and slurred speech; Per EMS, this is patient's baseline from a previous CVA but this information was not found in patient's EMR) ? ? ?HPI ? ?Connor [name] is a 61 y.o. male who was brought in by EMS.  He reportedly was found down.  He had fallen and hit his head he said.  He had active arterial bleeding out of a laceration just at the frontotemporal area.  EMS reports history of strokes.  In the old records it says he has ALS.  I have attached past medical history from a clinic note. ? ? Past Medical History:  ?Diagnosis Date  ? ALS (amyotrophic lateral sclerosis) (CMS-HCC) 05/2016  ? Diabetes mellitus (CMS-HCC)  ? Hypertension  ? Weakness of right arm  ? ? ?Physical Exam  ? ?Triage Vital Signs: ?ED Triage Vitals  ?Enc Vitals Group  ?   BP 03/19/22 0914 (!) 131/91  ?   Pulse Rate 03/19/22 0914 (!) 112  ?   Resp 03/19/22 0914 19  ?   Temp 03/19/22 0914 97.9 ?F (36.6 ?C)  ?   Temp Source 03/19/22 0914 Oral  ?   SpO2 03/19/22 0914 97 %  ?   Weight 03/19/22 0916 169 lb 15.6 oz (77.1 kg)  ?   Height 03/19/22 0916 5\' 9"  (1.753 m)  ?   Head Circumference --   ?   Peak Flow --   ?   Pain Score --   ?   Pain Loc --   ?   Pain Edu? --   ?   Excl. in GC? --   ? ? ?Most recent vital signs: ?Vitals:  ? 03/19/22 1130 03/19/22 1230  ?BP: (!) 116/97 122/88  ?Pulse: (!) 117 (!) 112  ?Resp:    ?Temp:    ?SpO2: 97% 97%  ? ? ? ?General: Awake, alert.  Patient moving arms and legs but arms are very stiff.  He is able to bend the elbows but not well.  EMS reports this is his baseline.  Several EMS providers came up and told me that this is how he is when they respond to his house.  His speech is always  slurry. ?Head normocephalic atraumatic except for a 3 cm laceration at the frontal temporal area which is pumping blood out.  After irrigating it and anesthetizing it with 1% lidocaine with epi which did nothing for the bleed.  Wound was palpated nothing was felt in the wound I did not see anything in the wound.  Brief episodes immediately after irrigation.  It was very difficult to get the bleeding to stop direct pressure did not work.  I then instilled a lot of lidocaine and attempt to tamponade and use the epinephrine to constrict the vessel.  Even after large volume of lidocaine with epi about 8 cc was instilled I was able to evert the skin with forceps and find the arterial bleeder.  I was able to put a stitch around this bleeder and get the bleeding under control.  The wound was closed with staples.  5 staples there does appear to be a palpable defect that may be a skull defect or  may be just soft tissue defect that is firm because of the swelling.  I closed the wound as soon as possible because I was worried about head injury.  At any rate he is going for head CT. ?CV:  Good peripheral perfusion.  Heart regular rate and rhythm no audible murmurs ?Resp:  Normal effort.  Lungs are clear ?Abd:  No distention.  Soft nontender ? ? ? ?ED Results / Procedures / Treatments  ? ?Labs ?(all labs ordered are listed, but only abnormal results are displayed) ?Labs Reviewed  ?COMPREHENSIVE METABOLIC PANEL - Abnormal; Notable for the following components:  ?    Result Value  ? Glucose, Bld 160 (*)   ? BUN 26 (*)   ? All other components within normal limits  ?CBC WITH DIFFERENTIAL/PLATELET - Abnormal; Notable for the following components:  ? Hemoglobin 12.1 (*)   ? HCT 36.8 (*)   ? All other components within normal limits  ?CBG MONITORING, ED - Abnormal; Notable for the following components:  ? Glucose-Capillary 170 (*)   ? All other components within normal limits  ?CBG MONITORING, ED  ? ? ? ?EKG ? ?EKG read interpreted by  me shows normal sinus rhythm rate of about 155 apparent left axis.  There is so much artifact is difficult really to see if there is anything else going on but there does not appear to be anything else going on.  The computer thinks it has A-fib but I see P waves and at least one lead. ?Later her heart rate comes down about 105 now. ? ?RADIOLOGY ?Chest x-ray reviewed by me read by radiology shows no acute disease ?CT of the head and neck reviewed by me and then read by radiology shows no acute disease ? ? ?PROCEDURES: ? ?Critical Care performed: Critical care time about 45 minutes.  This includes preparing the patient's wound evaluating studies discussing the patient in detail with his wife who then arrived.  Additionally I assisted afterwards with some of the patient care given him some ice water to drink etc. explained carefully to ? ?Procedures please see above physical exam description of head for laceration and repair. ? ? ?MEDICATIONS ORDERED IN ED: ?Medications  ?lidocaine-EPINEPHrine (XYLOCAINE W/EPI) 2 %-1:100000 (with pres) injection 10 mL (10 mLs Infiltration Given by Other 03/19/22 0920)  ?lidocaine (PF) (XYLOCAINE) 1 % injection (  Given by Other 03/19/22 0920)  ?ceFAZolin (ANCEF) IVPB 1 g/50 mL premix (0 g Intravenous Stopped 03/19/22 1043)  ?Tdap (BOOSTRIX) injection 0.5 mL (0.5 mLs Intramuscular Given 03/19/22 1015)  ?sodium chloride 0.9 % bolus 1,000 mL (0 mLs Intravenous Stopped 03/19/22 1237)  ? ? ? ?IMPRESSION / MDM / ASSESSMENT AND PLAN / ED COURSE  ?I reviewed the triage vital signs and the nursing notes. ? ?----------------------------------------- ?10:45 AM on 03/19/2022 ?----------------------------------------- ?Patient's wife comes and reports that he is at his baseline now. ? ? ? ?The patient is on the cardiac monitor to evaluate for evidence of arrhythmia and/or significant heart rate changes.Patient's heart rate is now 117. ?Patient's electrolytes CBC etc. are okay.  His hemoglobin is lower than  it was about 8 months ago.  EMS reports that he lost at least 250 cc of blood at home and he lost possibly almost as much here before I got the bleeding under control.  This likely is why he is tachycardic.  Patient is no longer bleeding.  I am uncertain when he fell.  His heart rate is coming down though.  I  think he should be okay to go home.  He can follow-up with his regular doctor to repeat his H&H. ? ? ?FINAL CLINICAL IMPRESSION(S) / ED DIAGNOSES  ? ?Final diagnoses:  ?Injury of head, initial encounter  ?Laceration of scalp, initial encounter  ? ? ? ?Rx / DC Orders  ? ?ED Discharge Orders   ? ? None  ? ?  ? ?----------------------------------------- ?10:51 AM on 03/19/2022 ?----------------------------------------- ?Patient will need an EMS/ambulance ride to go home since he cannot get in her hospital so.  Wife likely will help.  We will give him some fluid while waiting for that. ? ?Note:  This document was prepared using Dragon voice recognition software and may include unintentional dictation errors. ?  ?Arnaldo Natal, MD ?03/19/22 1049 ? ?  ?Arnaldo Natal, MD ?03/19/22 1557 ? ?

## 2022-03-22 ENCOUNTER — Telehealth: Payer: Self-pay | Admitting: Family Medicine

## 2022-03-22 NOTE — Telephone Encounter (Signed)
Copied from CRM [id number]. Topic: Medicare AWV ?>> Mar 22, 2022  9:48 AM Claudette Laws R wrote: ?Reason for CRM:  ?No answer unable to leave a message for patient to call back and schedule Medicare Annual Wellness Visit (AWV) in office.  ? ?If unable to come into the office for AWV,  please offer to do virtually or by telephone. ? ?Last AWV: 03/15/2020 ? ?Please schedule at anytime with [facility] Health Advisor. ? ?30 minute appointment for Virtual or phone ?45 minute appointment for in office or Initial virtual/phone ? ?Any questions, please contact me at [phone] ?

## 2022-04-05 ENCOUNTER — Telehealth: Payer: Self-pay

## 2022-04-05 NOTE — Telephone Encounter (Signed)
Copied from CRM [id number]. Topic: General - Call Back - No Documentation >> Apr 05, 2022 10:38 AM Randol Kern wrote: Reason for CRM: Requesting an FL2 to place pt in adult protection, also an affidavit stating whether he believes the pt needs a guardian.   [place] [employer] calling from [facility] Adult Services   Best contact: [phone]

## 2022-04-05 NOTE — Telephone Encounter (Signed)
I have no idea what this is about. I have no information about him requiring a guardian. He has not been seen here since last September. He was seen by neurology in [place] in February, maybe they have more information about change in status.

## 2022-04-09 NOTE — Telephone Encounter (Signed)
Returned call to [employer].  Voicemail full. If she r/t's call to office please relay Dr. Theodis Aguas message.

## 2022-04-12 NOTE — Telephone Encounter (Signed)
Tried returning [employer] Voicemail full. Unable to leave message.

## 2022-04-18 ENCOUNTER — Emergency Department: Payer: Medicare Other

## 2022-04-18 ENCOUNTER — Emergency Department
Admission: EM | Admit: 2022-04-18 | Discharge: 2022-04-20 | Disposition: A | Discharge status: Home / Self Care | Payer: Medicare Other | Attending: Emergency Medicine | Admitting: Emergency Medicine

## 2022-04-18 ENCOUNTER — Encounter: Payer: Self-pay | Admitting: *Deleted

## 2022-04-18 ENCOUNTER — Other Ambulatory Visit: Payer: Self-pay

## 2022-04-18 DIAGNOSIS — L98429 Non-pressure chronic ulcer of back with unspecified severity: Secondary | ICD-10-CM

## 2022-04-18 DIAGNOSIS — K5900 Constipation, unspecified: Secondary | ICD-10-CM | POA: Insufficient documentation

## 2022-04-18 DIAGNOSIS — R300 Dysuria: Secondary | ICD-10-CM | POA: Diagnosis not present

## 2022-04-18 DIAGNOSIS — R339 Retention of urine, unspecified: Secondary | ICD-10-CM | POA: Insufficient documentation

## 2022-04-18 DIAGNOSIS — R1310 Dysphagia, unspecified: Secondary | ICD-10-CM | POA: Diagnosis not present

## 2022-04-18 DIAGNOSIS — R2689 Other abnormalities of gait and mobility: Secondary | ICD-10-CM | POA: Diagnosis not present

## 2022-04-18 DIAGNOSIS — M6281 Muscle weakness (generalized): Secondary | ICD-10-CM | POA: Insufficient documentation

## 2022-04-18 DIAGNOSIS — L89159 Pressure ulcer of sacral region, unspecified stage: Secondary | ICD-10-CM | POA: Diagnosis not present

## 2022-04-18 LAB — CBC
HCT: 33.1 % — ABNORMAL LOW (ref 39.0–52.0)
Hemoglobin: 10.8 g/dL — ABNORMAL LOW (ref 13.0–17.0)
MCH: 27 pg (ref 26.0–34.0)
MCHC: 32.6 g/dL (ref 30.0–36.0)
MCV: 82.8 fL (ref 80.0–100.0)
Platelets: 396 10*3/uL (ref 150–400)
RBC: 4 MIL/uL — ABNORMAL LOW (ref 4.22–5.81)
RDW: 13.9 % (ref 11.5–15.5)
WBC: 6.9 10*3/uL (ref 4.0–10.5)
nRBC: 0 % (ref 0.0–0.2)

## 2022-04-18 LAB — URINALYSIS, ROUTINE W REFLEX MICROSCOPIC
Bilirubin Urine: NEGATIVE
Glucose, UA: 150 mg/dL — AB
Hgb urine dipstick: NEGATIVE
Ketones, ur: NEGATIVE mg/dL
Leukocytes,Ua: NEGATIVE
Nitrite: NEGATIVE
Protein, ur: NEGATIVE mg/dL
Specific Gravity, Urine: 1.025 (ref 1.005–1.030)
pH: 5 (ref 5.0–8.0)

## 2022-04-18 LAB — COMPREHENSIVE METABOLIC PANEL
ALT: 11 U/L (ref 0–44)
AST: 18 U/L (ref 15–41)
Albumin: 3.9 g/dL (ref 3.5–5.0)
Alkaline Phosphatase: 59 U/L (ref 38–126)
Anion gap: 10 (ref 5–15)
BUN: 31 mg/dL — ABNORMAL HIGH (ref 6–20)
CO2: 23 mmol/L (ref 22–32)
Calcium: 9.4 mg/dL (ref 8.9–10.3)
Chloride: 100 mmol/L (ref 98–111)
Creatinine, Ser: 0.9 mg/dL (ref 0.61–1.24)
GFR, Estimated: >60 mL/min (ref >60)
Glucose, Bld: 118 mg/dL — ABNORMAL HIGH (ref 70–99)
Potassium: 3.6 mmol/L (ref 3.5–5.1)
Sodium: 133 mmol/L — ABNORMAL LOW (ref 135–145)
Total Bilirubin: 0.6 mg/dL (ref 0.3–1.2)
Total Protein: 7.9 g/dL (ref 6.5–8.1)

## 2022-04-18 NOTE — ED Notes (Signed)
Pt reported that he needed to urinate, urinal provided, pt voided approx . Urine specimen collected and sent to lab.

## 2022-04-18 NOTE — ED Triage Notes (Signed)
Pt in recliner. Pt brought in via ems from home.  Pt has ALS.  Pt voided this am and has not voided since.   Wife with pt.  Family requesting placement for long term care

## 2022-04-18 NOTE — ED Notes (Signed)
Bladder scan in triage 

## 2022-04-18 NOTE — ED Provider Notes (Signed)
[facility] Provider Note   Event Date/Time   First MD Initiated Contact with Patient 04/18/22 2009     (approximate) History  Urinary Retention  HPI Connor [name] is a 61 y.o. male with a past medical history of ALS who presents with his wife who is his caregiver with complaints of patient having painful urination as well as constipation over the last 7 days.  Caregiver also states that she was instructed by her DSS social worker to present to the emergency department in order to have patient admitted to a long-term care facility as he has deteriorated to the point where home health providers are unable to care for him adequately at this point.  Caregiver also states that patient has a chronic sacral ulcer but states that it is unchanged over the last month.  Caregiver only states that patient's strength has significantly decreased over the past month gradually.  Caregiver also states that patient has had worsening difficulty speaking and swallowing.  States that he often chokes on his food.  Denies patient having any fever, diarrhea, vomiting, difficulty breathing   Physical Exam  Triage Vital Signs: ED Triage Vitals  Enc Vitals Group     BP 04/18/22 1811 (!) 122/93     Pulse Rate 04/18/22 1811 89     Resp 04/18/22 1811 20     Temp 04/18/22 1811 98.7 F (37.1 C)     Temp Source 04/18/22 1811 Oral     SpO2 04/18/22 1811 96 %     Weight 04/18/22 1812 169 lb (76.7 kg)     Height 04/18/22 1812 5\' 9"  (1.753 m)     Head Circumference --      Peak Flow --      Pain Score 04/18/22 1812 0     Pain Loc --      Pain Edu? --      Excl. in GC? --    Most recent vital signs: Vitals:   04/20/22 1900 04/20/22 2000  BP: 120/87 127/80  Pulse: 79 80  Resp: 18 17  Temp: 97.9 F (36.6 C)   SpO2: 100% 100%   General: Awake, cooperative CV:  Good peripheral perfusion.  Resp:  Normal effort.  Abd:  No distention.  No tenderness to palpation Other:  Elderly  African-American male laying in bed in no acute distress.  Sacral ulcer appreciated ED Results / Procedures / Treatments  Labs (all labs ordered are listed, but only abnormal results are displayed) Labs Reviewed  COMPREHENSIVE METABOLIC PANEL - Abnormal; Notable for the following components:      Result Value   Sodium 133 (*)    Glucose, Bld 118 (*)    BUN 31 (*)    All other components within normal limits  CBC - Abnormal; Notable for the following components:   RBC 4.00 (*)    Hemoglobin 10.8 (*)    HCT 33.1 (*)    All other components within normal limits  URINALYSIS, ROUTINE W REFLEX MICROSCOPIC - Abnormal; Notable for the following components:   Color, Urine YELLOW (*)    APPearance CLEAR (*)    Glucose, UA 150 (*)    All other components within normal limits  CBG MONITORING, ED - Abnormal; Notable for the following components:   Glucose-Capillary 119 (*)    All other components within normal limits   PROCEDURES: Critical Care performed: No Procedures MEDICATIONS ORDERED IN ED: Medications - No data to display IMPRESSION / MDM /  ASSESSMENT AND PLAN / ED COURSE  I reviewed the triage vital signs and the nursing notes.                             The patient is on the cardiac monitor to evaluate for evidence of arrhythmia and/or significant heart rate changes. Patient's presentation is most consistent with exacerbation of chronic illness. Patient is a 61 year old African-American male with a history of ALS who presents for painful urination, constipation, sacral ulcer, and worsening generalized weakness.  Patient's laboratory evaluation shows no red flag symptomatology at this time including clean urinalysis, stable anemia, and mild hyponatremia.  Patient was able to void well 170 mL which was more than his bladder scan appreciated.  Patient's last bowel movement was this this morning and therefore have little concern for clinically significant constipation.  Given patient was  told by their social worker to present to the emergency department for placement in a skilled nursing facility, patient will require evaluation by our social work team as well as PT/OT for possible placement.  Dispo: Boarding   FINAL CLINICAL IMPRESSION(S) / ED DIAGNOSES   Final diagnoses:  Dysuria  Constipation, unspecified constipation type  Skin ulcer of sacrum, unspecified ulcer stage (HCC)   Rx / DC Orders   ED Discharge Orders     None      Note:  This document was prepared using Dragon voice recognition software and may include unintentional dictation errors.   Merwyn Katos, MD 04/25/22 [phone]

## 2022-04-18 NOTE — ED Triage Notes (Signed)
Pt from home via [facility] with reports that pt has not been able to urinate for 2 days and has not had a BM in 7 days. CBG 142

## 2022-04-19 MED ORDER — LACTATED RINGERS IV SOLN: INTRAVENOUS | Status: DC

## 2022-04-19 MED ORDER — ORAL CARE MOUTH RINSE
15.0000 mL | Freq: Two times a day (BID) | OROMUCOSAL | Status: DC
  Administered 2022-04-19 – 2022-04-20 (×3): 15 mL via OROMUCOSAL
  Filled 2022-04-19 (×4): qty 15

## 2022-04-19 MED ORDER — CHLORHEXIDINE GLUCONATE 0.12 % MT SOLN
15.0000 mL | Freq: Two times a day (BID) | OROMUCOSAL | Status: DC
  Administered 2022-04-20 (×2): 15 mL via OROMUCOSAL

## 2022-04-19 NOTE — Evaluation (Signed)
Occupational Therapy Evaluation Patient Details Name: Connor [name] MRN: [id number] DOB: 1960-12-19 Today's Date: 04/19/2022   History of Present Illness Per H&P: Connor [name] is a 61 y.o. male with a past medical history of ALS who presents with his wife who is his caregiver with complaints of patient having painful urination as well as constipation over the last 7 days.  Caregiver also states that she was instructed by her DSS social worker to present to the emergency department in order to have patient admitted to a long-term care facility as he has deteriorated to the point where home health providers are unable to care for him adequately at this point.   Clinical Impression   Pt seen for OT evaluation.  Hx of ALS with recent decline over the last 2 weeks.  Pt comes from home with spouse and Home Health/Hospice as primary caregivers.  Spouse not present for eval but per chart, seeking placement d/t pt's care becoming overwhelming at home.  Pt presents as total assist for all self care, total A x2 for bed mobility.  Pt able to sit EOB with intermittent min A for maintaining midline positioning, with L lateral and posterior leaning.  Pt was able to perform sit to stand with mod A of 2.  Dep for hand set up on walker.  Pt with limited BUE use, see below for details.  In sitting, pt limited to being able to move L hand on/off lap, but unable to reposition RUE.  Pt has 3+/5 grip bilaterally, BUE hypertonicity, R>L.  Reinforced need for frequent positional changes with staff as pt is dep for bed mobility.  Left pt in R side lying upon completion of eval with pillow propped at buttocks to maintain side lying.  No additional skilled OT indicated at this time.  Would benefit from long term care placement d/t physical burden extending beyond spouse's ability in the home.      Recommendations for follow up therapy are one component of a multi-disciplinary discharge planning process, led by the attending  physician.  Recommendations may be updated based on patient status, additional functional criteria and insurance authorization.   Follow Up Recommendations  Long-term institutional care without follow-up therapy    Assistance Recommended at Discharge Frequent or constant Supervision/Assistance  Patient can return home with the following Two people to help with walking and/or transfers;Two people to help with bathing/dressing/bathroom;Assistance with feeding;Help with stairs or ramp for entrance;Assist for transportation;Direct supervision/assist for medications management    Functional Status Assessment  Patient has had a recent decline in their functional status and/or demonstrates limited ability to make significant improvements in function in a reasonable and predictable amount of time  Equipment Recommendations   (defer to next venue of care)    Recommendations for Other Services       Precautions / Restrictions Precautions Precautions: Fall Restrictions Weight Bearing Restrictions: No Other Position/Activity Restrictions: frequent positional changes needed d/t hx of chronic sacral ulcer; pt is dep for positional changes      Mobility Bed Mobility Overal bed mobility: Needs Assistance Bed Mobility: Supine to Sit, Sit to Supine, Rolling Rolling: Total assist, +2 for physical assistance   Supine to sit: Total assist, +2 for physical assistance Sit to supine: Total assist, +2 for physical assistance     Patient Response: Cooperative  Transfers Overall transfer level: Needs assistance Equipment used: Rolling walker (2 wheels) Transfers: Sit to/from Stand Sit to Stand: Mod assist, +2 physical assistance, From elevated surface  General transfer comment: Requires manual facilitation of hands on RW prior to standing.      Balance Overall balance assessment: Needs assistance Sitting-balance support: Feet supported, Single extremity supported Sitting  balance-Leahy Scale: Poor Sitting balance - Comments: Posterior and L lateral lean. Intermittent bouts of being able to maintain static sitting but < 10 sec increments. Postural control: Posterior lean, Left lateral lean Standing balance support: Bilateral upper extremity supported, During functional activity, Reliant on assistive device for balance Standing balance-Leahy Scale: Poor Standing balance comment: reliant on RW and +2 physical assist to maintain standing; vc for postural adjustment with ability to squeeze glutes and raise chest for more erect posture, but unable to maintain.                           ADL either performed or assessed with clinical judgement   ADL Overall ADL's : Needs assistance/impaired                                       General ADL Comments: total assist all ADLs at bed level     Vision Patient Visual Report: No change from baseline Additional Comments: able to scan all quadrants     Perception     Praxis      Pertinent Vitals/Pain Pain Assessment Faces Pain Scale: No hurt          Extremity/Trunk Assessment Upper Extremity Assessment Upper Extremity Assessment: Generalized weakness;RUE deficits/detail;LUE deficits/detail RUE Deficits / Details: increased tone BUEs, R>L.  L shoulder 2-/5 for abd/add, 1/5 L shoulder flexion, R shoulder grossly 1/5 all planes, bilat elbows 1/5, grip bilat 3+/5   Lower Extremity Assessment Lower Extremity Assessment: Generalized weakness;Defer to PT evaluation RLE Deficits / Details: weakness throughout: ankle DF/PF grossly 2/5 MMT, knee and hip grossly 2/5 MMT LLE Deficits / Details: LLE grossly 3/5 MMT   Cervical / Trunk Assessment Cervical / Trunk Assessment: Normal   Communication Communication Communication: Expressive difficulties   Cognition Arousal/Alertness: Awake/alert Behavior During Therapy: WFL for tasks assessed/performed Overall Cognitive Status: Difficult to  assess                                 General Comments: Able to follow 1 step commands consistently     General Comments  chronic sacral ulcer, covered with bandage; positioned in R side lying upon completion of eval with pillow wedged under buttock to maintain side lying    Exercises Other Exercises Other Exercises: Role of OT in the actue setting; frequent positional changes needed by staff to prevent skin breakdown every 1-2 hours   Shoulder Instructions      Home Living Family/patient expects to be discharged to:: Private residence Living Arrangements: Spouse/significant other Available Help at Discharge: Family;Home health Type of Home: House Home Access: Stairs to enter Technical brewer of Steps: 5 Entrance Stairs-Rails: Right;Left;Can reach both Home Layout: One level     Bathroom Shower/Tub: Occupational psychologist: Standard     Home Equipment: Conservation officer, nature (2 wheels);BSC/3in1;Wheelchair - manual          Prior Functioning/Environment Prior Level of Function : Needs assist       Physical Assist : Mobility (physical);ADLs (physical) Mobility (physical): Bed mobility;Transfers;Stairs ADLs (physical): Feeding;Grooming;Dressing;Bathing;Toileting;IADLs Mobility Comments: Per spouse pt now requiring +2  physical assist to stand to RW. Prior to 2 weeks ago was +1 stand dtep transfer to <> from surfaces. ADLs Comments: Requires physical assist for all ADL's. Was receiving home care 5x/week for 4 hour increments.        OT Problem List: Decreased strength;Impaired balance (sitting and/or standing);Decreased range of motion;Impaired tone;Decreased activity tolerance;Decreased coordination;Impaired UE functional use      OT Treatment/Interventions:      OT Goals(Current goals can be found in the care plan section) Acute Rehab OT Goals Patient Stated Goal: Pt hungry and wanted to eat (therapist informed pt that SLP eval pending to  assess appropriate intake) OT Goal Formulation: With patient Time For Goal Achievement: 04/19/22 Potential to Achieve Goals: Fair  OT Frequency:      Co-evaluation   Reason for Co-Treatment: Complexity of the patient's impairments (multi-system involvement);For patient/therapist safety;To address functional/ADL transfers PT goals addressed during session: Mobility/safety with mobility;Balance;Proper use of DME;Strengthening/ROM OT goals addressed during session: ADL's and self-care;Proper use of Adaptive equipment and DME;Strengthening/ROM      AM-PAC OT "6 Clicks" Daily Activity     Outcome Measure Help from another person eating meals?: Total Help from another person taking care of personal grooming?: Total Help from another person toileting, which includes using toliet, bedpan, or urinal?: Total Help from another person bathing (including washing, rinsing, drying)?: Total Help from another person to put on and taking off regular upper body clothing?: Total Help from another person to put on and taking off regular lower body clothing?: Total 6 Click Score: 6   End of Session Equipment Utilized During Treatment: Gait belt;Rolling walker (2 wheels) Nurse Communication: Mobility status  Activity Tolerance: Patient tolerated treatment well Patient left: in bed;with bed alarm set  OT Visit Diagnosis: Muscle weakness (generalized) (M62.81);Other abnormalities of gait and mobility (R26.89)                Time: [id number] OT Time Calculation (min): 21 min Charges:  OT General Charges $OT Visit: 1 Visit OT Evaluation $OT Eval Moderate Complexity: 1 Mod  Leta Speller, MS, OTR/L   Darleene Cleaver 04/19/2022, 9:58 AM

## 2022-04-19 NOTE — Evaluation (Signed)
Physical Therapy Evaluation Patient Details Name: Connor [name] MRN: [id number] DOB: 26-Mar-1961 Today's Date: 04/19/2022  History of Present Illness  Per H&P: Connor [name] is a 61 y.o. male with a past medical history of ALS who presents with his wife who is his caregiver with complaints of patient having painful urination as well as constipation over the last 7 days.  Caregiver also states that she was instructed by her DSS social worker to present to the emergency department in order to have patient admitted to a long-term care facility as he has deteriorated to the point where home health providers are unable to care for him adequately at this point.   Clinical Impression  Pt admitted with above diagnosis. Pt received upright in bed, pt unable to speak due to advanced ALS. Prior to room entry able to talk to pt's spouse on telephone who provided PLOF, home set up, and assist levels. Per spouse, prior to admission and ~2 weeks ago, pt was able to stand to RW with 1 person assist and transfer via stand step/stand pivot to surfaces. In the last couple weeks, pt has had decline in physical function to the point where pt requiring +2 person max assist to stand up to AD. Pt has relied on family and caregivers to carry pt up and down stairs at his home for all MD appointments. Pt dependent for all mobility needs. Per spouse they were receiving home care 5 days/week for 4 hour periods but has gotten to the point where spouse is unable to provide pt with all his needs.   At bedside, co eval with PT/OT performed for pt safety and for spouse reporting pt needing 2 person assist. Pt demonstrates increased RLE weakness compared to LLE with RLE grossly 1+ to 2/5 MMT at ankle and 2/5 MMT at knee but 3/5 MMT with hip flexion. RLE grossly 3/5 MMT at all major joints. Refer to OT assessment on UE function. Pt overall requiring HOB elevated and total assist for transferring from supine to seated EOB with +2. Noted  posterior and L sided lateral lean in sitting with feet support. Pt able to briefly attain static sitting for only 5-10 sec increments before needing light physical assist at shoulders to stay upright. modA+2 to stand to RW with good glute activation with multimodal cuing but requires HOB elevated with difficulty maintaining hip/knee extension during standing. Pt returned to supine in bed with 2+ total assist dependent for R partial side lying to offload sacrum due to sacral ulcer. Pt requiring significant physical assist for all mobility at this time that spouse is unable to adequately give to pt. PT to recommend LTC at discharge. No acute PT needs identified at this time. PT to sign off.     Recommendations for follow up therapy are one component of a multi-disciplinary discharge planning process, led by the attending physician.  Recommendations may be updated based on patient status, additional functional criteria and insurance authorization.  Follow Up Recommendations Long-term institutional care without follow-up therapy    Assistance Recommended at Discharge Frequent or constant Supervision/Assistance  Patient can return home with the following  Two people to help with walking and/or transfers;Two people to help with bathing/dressing/bathroom;Direct supervision/assist for medications management;Assistance with feeding;Assist for transportation    Equipment Recommendations None recommended by PT  Recommendations for Other Services       Functional Status Assessment Patient has had a recent decline in their functional status and/or demonstrates limited ability to make significant  improvements in function in a reasonable and predictable amount of time     Precautions / Restrictions Precautions Precautions: Fall Restrictions Weight Bearing Restrictions: No      Mobility  Bed Mobility Overal bed mobility: Needs Assistance Bed Mobility: Supine to Sit, Sit to Supine, Rolling Rolling:  Total assist, +2 for physical assistance   Supine to sit: Total assist, +2 for physical assistance Sit to supine: Total assist, +2 for physical assistance     Patient Response: Cooperative  Transfers Overall transfer level: Needs assistance Equipment used: Rolling walker (2 wheels) Transfers: Sit to/from Stand Sit to Stand: Mod assist, +2 physical assistance, From elevated surface           General transfer comment: Requires manual facilitation of hands on RW prior to standing.    Ambulation/Gait               General Gait Details: unable at baseline  Stairs            Wheelchair Mobility    Modified Rankin (Stroke Patients Only)       Balance Overall balance assessment: Needs assistance Sitting-balance support: Feet supported, Single extremity supported Sitting balance-Leahy Scale: Poor Sitting balance - Comments: Posterior and L lateral lean. Intermittent bouts of being able to maintain static sitting but < 10 sec increments. Postural control: Posterior lean, Left lateral lean Standing balance support: Bilateral upper extremity supported, During functional activity, Reliant on assistive device for balance Standing balance-Leahy Scale: Poor Standing balance comment: reliant on RW and +2 physical assist to maintain standing                             Pertinent Vitals/Pain Pain Assessment Pain Assessment: Faces Faces Pain Scale: No hurt    Home Living Family/patient expects to be discharged to:: Private residence Living Arrangements: Spouse/significant other Available Help at Discharge: Family;Home health Type of Home: House Home Access: Stairs to enter Entrance Stairs-Rails: Right;Left;Can reach both Entrance Stairs-Number of Steps: 5   Home Layout: One level Home Equipment: Agricultural consultant (2 wheels);BSC/3in1;Wheelchair - manual      Prior Function Prior Level of Function : Needs assist       Physical Assist : Mobility  (physical);ADLs (physical) Mobility (physical): Bed mobility;Transfers;Stairs ADLs (physical): Feeding;Grooming;Dressing;Bathing;Toileting;IADLs Mobility Comments: Per spouse pt now requiring +2 physical assist to stand to RW. Prior to 2 weeks ago was +1 stand dtep transfer to <> from surfaces. ADLs Comments: Requires physical assist for all ADL's. Was receiving home care 5x/week for 4 hour increments.     Hand Dominance        Extremity/Trunk Assessment   Upper Extremity Assessment Upper Extremity Assessment: Generalized weakness;Defer to OT evaluation    Lower Extremity Assessment Lower Extremity Assessment: Generalized weakness;RLE deficits/detail;LLE deficits/detail RLE Deficits / Details: weakness throughout: ankle DF/PF grossly 2/5 MMT, knee and hip grossly 2/5 MMT LLE Deficits / Details: LLE grossly 3/5 MMT    Cervical / Trunk Assessment Cervical / Trunk Assessment: Normal  Communication   Communication: Expressive difficulties (due to ALS)  Cognition Arousal/Alertness: Awake/alert Behavior During Therapy: WFL for tasks assessed/performed Overall Cognitive Status: Difficult to assess                                          General Comments      Exercises Other Exercises Other  Exercises: Role of PT in acute setting, body positioning to prevent skin breakdown.   Assessment/Plan    PT Assessment Patient does not need any further PT services  PT Problem List         PT Treatment Interventions      PT Goals (Current goals can be found in the Care Plan section)  Acute Rehab PT Goals PT Goal Formulation: All assessment and education complete, DC therapy    Frequency       Co-evaluation PT/OT/SLP Co-Evaluation/Treatment: Yes Reason for Co-Treatment: Complexity of the patient's impairments (multi-system involvement);For patient/therapist safety;To address functional/ADL transfers PT goals addressed during session: Mobility/safety with  mobility;Balance;Proper use of DME;Strengthening/ROM OT goals addressed during session: ADL's and self-care;Proper use of Adaptive equipment and DME;Strengthening/ROM       AM-PAC PT "6 Clicks" Mobility  Outcome Measure Help needed turning from your back to your side while in a flat bed without using bedrails?: Total Help needed moving from lying on your back to sitting on the side of a flat bed without using bedrails?: Total Help needed moving to and from a bed to a chair (including a wheelchair)?: Total Help needed standing up from a chair using your arms (e.g., wheelchair or bedside chair)?: A Lot Help needed to walk in hospital room?: Total Help needed climbing 3-5 steps with a railing? : Total 6 Click Score: 7    End of Session Equipment Utilized During Treatment: Gait belt Activity Tolerance: Patient tolerated treatment well;Treatment limited secondary to medical complications (Comment) Patient left: in bed;with call bell/phone within reach;with bed alarm set Nurse Communication: Mobility status PT Visit Diagnosis: Other symptoms and signs involving the nervous system (R29.898);Muscle weakness (generalized) (M62.81);Unsteadiness on feet (R26.81)    Time: [id number] PT Time Calculation (min) (ACUTE ONLY): 21 min   Charges:   PT Evaluation $PT Eval Moderate Complexity: 1 Mod         Kahlyn Shippey M. Fairly IV, PT, DPT Physical Therapist- [place]  [facility]  04/19/2022, 9:36 AM

## 2022-04-19 NOTE — ED Notes (Signed)
SLP at bedside.

## 2022-04-19 NOTE — TOC Progression Note (Signed)
Transition of Care [facility]) - Progression Note    Patient Details  Name: Connor [name] MRN: [id number] Date of Birth: Jun 04, 1961  Transition of Care [facility]) CM/SW Contact  Shelbie Hutching, RN Phone Number: 04/19/2022, 5:03 PM  Clinical Narrative:    Connor [name] with [facility] met with wife at the bedside.  There are no hospice beds at the hospice home today but there will be one tomorrow.  As long as patient is approved for the hospice home they should be able to accept patient tomorrow.     Expected Discharge Plan: [place] Barriers to Discharge: Hospice Bed not available  Expected Discharge Plan and Services Expected Discharge Plan: [place]   Discharge Planning Services: CM Consult Post Acute Care Choice: Hospice Living arrangements for the past 2 months: Single Family Home                 DME Arranged: Hospital bed DME Agency: Other - Comment Lajean Manes) Date DME Agency Contacted: 04/19/22 Time DME Agency Contacted: [phone] Representative spoke with at DME Agency: Marlane Mingle             Social Determinants of Health ([place]) Interventions    Readmission Risk Interventions     View : No data to display.

## 2022-04-19 NOTE — ED Notes (Signed)
Case manager and MD at bedside speaking to pt and wife at this time.

## 2022-04-19 NOTE — ED Notes (Addendum)
Pt took purewick off again, chux saturated with urine, wife at bedside. Pt repositioned and brief placed as well as new chux.   MD updated on pt's wife wanting to speak to him.

## 2022-04-19 NOTE — ED Notes (Signed)
PT at bedside.

## 2022-04-19 NOTE — ED Notes (Addendum)
Male purewick removed, new one placed. Arm repositioned.

## 2022-04-19 NOTE — TOC Progression Note (Signed)
Transition of Care [facility]) - Progression Note    Patient Details  Name: EHRIN ARRIZON MRN: [id number] Date of Birth: [birth year]  [facility] CM/SW Contact  Shelbie Hutching, RN Phone Number: 04/19/2022, 2:15 PM  Clinical Narrative:    Speech came and evaluated patient, they report that the patient can not swallow and is high aspiration risk.  ED MD spoke with wife and patient at the bedside and explained that there are 2 options- option 1 get the patient a G tube/feeding tube or palliative/ hospice care.  Patient shakes his head and says no to G tube and shakes his head yes to comfort measures.  Wife cannot take care of patient at home. Since patient cannot swallow he would qualify for hospice home.  Patient's wife is very interested in the [facility] home.  RNCM gave hospice home referral to [facility] with [facility].  Lorayne Bender will come down and talk with the wife and with patient.     Expected Discharge Plan: Home w Hospice Care Barriers to Discharge: Other (must enter comment)  Expected Discharge Plan and Services Expected Discharge Plan: [place]   Discharge Planning Services: CM Consult   Living arrangements for the past 2 months: Single Family Home                 DME Arranged: Hospital bed DME Agency: Other - Comment Lajean Manes) Date DME Agency Contacted: 04/19/22 Time DME Agency Contacted: [phone] Representative spoke with at DME Agency: Marlane Mingle             Social Determinants of Health ([place]) Interventions    Readmission Risk Interventions     View : No data to display.

## 2022-04-19 NOTE — ED Notes (Signed)
Pt resting comfortably at this time. Rise and fall of chest. Pt not unable to express needs at this time due progression of ALS, pt currently awaiting placement for long term care.

## 2022-04-19 NOTE — Evaluation (Signed)
Clinical/Bedside Swallow Evaluation Patient Details  Name: Connor [name] MRN: [id number] Date of Birth: [birth year]  Today's Date: 04/19/2022 Time: SLP Start Time (ACUTE ONLY): 0940 SLP Stop Time (ACUTE ONLY): 1000 SLP Time Calculation (min) (ACUTE ONLY): 20 min  Past Medical History:  Past Medical History:  Diagnosis Date   Arthritis    cervical spondylosis, myelopathy    GERD (gastroesophageal reflux disease)    Hyperlipidemia    Hypertension    Lazy eye, left    Dr Thressa Sheller   Past Surgical History:  Past Surgical History:  Procedure Laterality Date   ANTERIOR CERVICAL DECOMPRESSION/DISCECTOMY FUSION 4 LEVELS N/A 01/21/2017   Procedure: ANTERIOR CERVICAL DECOMPRESSION/DISCECTOMY FUSION , INTERBODY PROSTHESIS,PLATE CERVICAL THREE- CERVICAL FOUR,CERVICAL FOUR- CERVICAL FIVE, CERVICAL FIVE- CERVICAL SIX, CERVICAL SIX- CERVICAL SEVEN;  Surgeon: Tressie Stalker, MD;  Location: [facility] OR;  Service: Neurosurgery;  Laterality: N/A;   CARPAL TUNNEL RELEASE Right 01/13/2018   Procedure: CARPAL TUNNEL RELEASE RIGHT;  Surgeon: Tressie Stalker, MD;  Location: [facility] OR;  Service: Neurosurgery;  Laterality: Right;   COLONOSCOPY WITH PROPOFOL N/A 08/30/2015   Procedure: COLONOSCOPY WITH PROPOFOL;  Surgeon: Midge Minium, MD;  Location: [facility] ENDOSCOPY;  Service: Endoscopy;  Laterality: N/A;   LEVATOR RESECTION Left 10/21/2020   Repair of blepharoptosis;([place]) levator resection - Birdena Crandall, MD [facility] Ophthalmology   STRABISMUS SURGERY Bilateral 07/10/2019   Procedure: STRABISMUS REPAIR BOTH EYES;  Surgeon: Verne Carrow, MD;  Location: [facility];  Service: Ophthalmology;  Laterality: Bilateral;   HPI:  Per ED note, "Connor [name] is a 61 y.o. male who was brought in by EMS.  He reportedly was found down.  He had fallen and hit his head he said.  He had active arterial bleeding out of a laceration just at the frontotemporal area.  EMS reports history of strokes.  In the old records it  says he has ALS.  I have attached past medical history from a clinic note." CXR 04/18/22 "1. No acute intrathoracic process."   Assessment / Plan / Recommendation  Clinical Impression  Pt seen for clinical swallowing evaluation. Pt alert. Notable dysarthria with harsh/hypophonic vocal quality, articulatory imprecision, and hypernasality. Difficult to understand. Cleared with RN.  Per chart review, pt known to SLP services from MBSS completed 03/05/16. At that time, pt recommended to continue consuming a regular diet with thin liquids. Pt endorsed consuming a regular diet with thin liquids PTA without difficulty. No family present to provide additional details of pt's swallow function PTA.  Oral motor examination completed. Pt with dysphonia as noted above. R lingual and facial reduced ROM and weakness. Pt kept L eye closed.   Pt given trials of ice chips (x2), thin liquids (via tsp; x2), and pureed (via tsp; x2). Pt presents with s/sx moderate oral dysphagia c/b oral holding and prolonged A-P transit/oral prep with ice chips and pureed. Additionally, pt demonstrated x1 instance of anterior loss of thin liquids via teaspoon. Pt with s/sx concerning for highly suspected pharyngeal dysphagia including seemingly delayed initiation and reduced laryngeal elevation to palpation, multiple swallows, and wet vocal quality across trials. Pt with overt s/sx concerning for pharyngeal dysphagia including delayed throat clearing with trials of ice chips and immediate coughing with trials of water and applesauce. Prolonged coughing with applesauce resulted in SLP deferring further PO trials. Of note, unable to feed self due to BUE hypertonicity.   At present, a safe oral diet cannot be recommended. Recommend NPO with oral care by nursing staff. Consideration  for long term alternate route of nutrition/hydration/medication if in alignment with GOC. Recommend Palliative Care consult for [place] given progressive nature of ALS  and pt's CLOF re: swallowing.   Pt is at increased risk for aspiration/aspiration PNA given progressive neurological disease (ALS), overall deconditioned state, need for assistance with feeding, and current clinical presentation.   Pt and RN made aware of results, recommendations, and SLP POC.   SLP to f/u per POC for clincial swallowing re-evaluation and consideration for MBSS pending [place] discussions.   SLP Visit Diagnosis: Dysphagia, oropharyngeal phase (R13.12)    Aspiration Risk  Moderate aspiration risk;Severe aspiration risk;Risk for inadequate nutrition/hydration    Diet Recommendation NPO;Alternative means - long-term (pending [place] discussions)   Medication Administration: Via alternative means    Other  Recommendations Recommended Consults:  (Palliative Care consult for [place]) Oral Care Recommendations: Oral care QID;Staff/trained caregiver to provide oral care Other Recommendations: Have oral suction available    Recommendations for follow up therapy are one component of a multi-disciplinary discharge planning process, led by the attending physician.  Recommendations may be updated based on patient status, additional functional criteria and insurance authorization.  Follow up Recommendations Follow physician's recommendations for discharge plan and follow up therapies      Assistance Recommended at Discharge Frequent or constant Supervision/Assistance  Functional Status Assessment Patient has had a recent decline in their functional status and/or demonstrates limited ability to make significant improvements in function in a reasonable and predictable amount of time  Frequency and Duration min 2x/week  2 weeks       Prognosis Prognosis for Safe Diet Advancement: Guarded Barriers to Reach Goals: Severity of deficits      Swallow Study   General Date of Onset: 04/18/22 HPI: Per ED note, "Connor [name] is a 61 y.o. male who was brought in by EMS.  He reportedly was found  down.  He had fallen and hit his head he said.  He had active arterial bleeding out of a laceration just at the frontotemporal area.  EMS reports history of strokes.  In the old records it says he has ALS.  I have attached past medical history from a clinic note." Type of Study: Bedside Swallow Evaluation Previous Swallow Assessment: MBSS completed 03/05/16 with recommendation for regular diet with thin liquids Diet Prior to this Study: Regular;Thin liquids (regular diet with thin liquids in EMR; RN reports pt NPO) Temperature Spikes Noted: No Respiratory Status: Room air History of Recent Intubation: No Behavior/Cognition: Alert Oral Cavity Assessment: Within Functional Limits Oral Care Completed by SLP: Yes Oral Cavity - Dentition: Poor condition;Missing dentition Vision:  (physically unable to feed self due to BUE hypertonicity) Self-Feeding Abilities: Total assist Patient Positioning: Upright in bed Baseline Vocal Quality:  (harsh; hypophonic) Volitional Cough: Weak Volitional Swallow: Able to elicit    Oral/Motor/Sensory Function Overall Oral Motor/Sensory Function: Severe impairment Facial ROM: Reduced right Facial Symmetry: Abnormal symmetry right Facial Strength: Reduced right Lingual ROM: Reduced right;Reduced left (reduced ROM greater on R) Lingual Strength: Reduced (reduced bilaterally, but greater appreciated on R)   Ice Chips Ice chips: Impaired Presentation: Spoon Oral Phase Impairments: Impaired mastication;Reduced lingual movement/coordination Oral Phase Functional Implications: Prolonged oral transit Pharyngeal Phase Impairments: Suspected delayed Swallow;Decreased hyoid-laryngeal movement;Multiple swallows;Wet Vocal Quality;Throat Clearing - Delayed   Thin Liquid Thin Liquid: Impaired Presentation: Spoon Oral Phase Impairments: Reduced labial seal Oral Phase Functional Implications: Right anterior spillage Pharyngeal  Phase Impairments: Suspected delayed  Swallow;Decreased hyoid-laryngeal movement;Multiple swallows;Wet Vocal Quality;Cough - Immediate  Nectar Thick Nectar Thick Liquid: Not tested   Honey Thick Honey Thick Liquid: Not tested   Puree Puree: Impaired Presentation: Spoon Oral Phase Impairments: Reduced lingual movement/coordination Oral Phase Functional Implications: Prolonged oral transit;Oral holding Pharyngeal Phase Impairments: Suspected delayed Swallow;Decreased hyoid-laryngeal movement;Wet Vocal Quality;Cough - Immediate;Multiple swallows Other Comments: prolonged coughing spell   Solid     Solid: Not tested     Cherrie Gauze, M.S., [facility] [phone] ([place])  Quintella Baton 04/19/2022,12:17 PM

## 2022-04-19 NOTE — Progress Notes (Addendum)
Civil engineer, [employer] [facility]) Hospital Liaison Note  Received request from Transitions of Care Manager  Charisse March for family interest in Hospice Home. Visited patient at bedside and spoke with wife/Debra to confirm interest and explain services.  MSW to await ED MD documentation to be posted. At that time, patient to be reviewed by [facility] MD. Approval for Hospice Home is determined by [facility]. Once [facility] MD has determined Hospice Home eligibility, [facility] will update hospital staff and family. TOC aware of above updates.   MSW attempted to contacted spouse/Debra to notify of her above information. Unfortunately, no answer & unable to leave VM as VM is full. MSW to touch base 6.2.  Please do not hesitate to call with any hospice related questions.    Thank you for the opportunity to participate in this patient's care.  Odette Fraction, MSW [employer] Liaison  [phone]

## 2022-04-19 NOTE — ED Notes (Addendum)
This RN concerned for risks of dehydration due to no intake while pt has been here due to concerns for dysphagia concerns. SLP order placed, MD made aware of concerns, IV to be placed and IVF to be given.

## 2022-04-19 NOTE — ED Notes (Signed)
[facility] at bedside

## 2022-04-19 NOTE — TOC Initial Note (Signed)
Transition of Care [facility]) - Initial/Assessment Note    Patient Details  Name: Connor [name] MRN: [id number] Date of Birth: 10-14-1961  Transition of Care [facility]) CM/SW Contact:    Allayne Butcher, RN Phone Number: 04/19/2022, 12:58 PM  Clinical Narrative:                 Patient came into the ED from home with reports of not being able to urinate for 2 days.  Patient has ALS and is currently under hospice services with [facility].  Spoke with patient's wife at the bedside, she reports that they are working on LTC and that APS told her to come to the hospital to get placed quicker.  RNCM explained that it does not work like that and I will not be able to find placement because patient has no long term payer source.   Spoke with Santo Held - [place] [facility]- [phone], she says that the wife has not turned in the Medicaid application- she is coming to the hospital today, she was going to get an FL2 filled out, and if wife completes the [facility] app she can expedite the processing.   I spoke with [facility] CM and SW- they have ordered a hospital bed for at home and the [facility] SW was going to start looking for Respite care bed.  Respite care is is only available for 5 days and 5 nights but it would get the patient's wife a short break.   Expected Discharge Plan: Home w Hospice Care Barriers to Discharge: Other (must enter comment)   Patient Goals and CMS Choice Patient states their goals for this hospitalization and ongoing recovery are:: Wife is worried about being able to care for patient at home      Expected Discharge Plan and Services Expected Discharge Plan: Home w Hospice Care   Discharge Planning Services: CM Consult   Living arrangements for the past 2 months: Single Family Home                 DME Arranged: Hospital bed DME Agency: Other - Comment Beverly Gust) Date DME Agency Contacted: 04/19/22 Time DME Agency Contacted: [id number] Representative spoke with at DME Agency:  [place]            Prior Living Arrangements/Services Living arrangements for the past 2 months: Single Family Home Lives with:: Spouse              Current home services: Homehealth aide    Activities of Daily Living      Permission Sought/Granted Permission sought to share information with : Other (comment), Family Supports Permission granted to share information with : Yes, Verbal Permission Granted              Emotional Assessment Appearance:: Appears older than stated age Attitude/Demeanor/Rapport: Unable to Assess     Alcohol / Substance Use: Not Applicable Psych Involvement: No (comment)  Admission diagnosis:  urinary retention Patient Active Problem List   Diagnosis Date Noted   Cervical spondylosis with myelopathy 01/21/2017   ALS (amyotrophic lateral sclerosis) (HCC) 09/07/2016   Cervical spinal stenosis 04/23/2016   History of adenomatous polyp of colon 01/04/2016   Diabetes mellitus without complication (HCC) 10/06/2015   Hypertension 10/06/2015   History of methicillin resistant Staphylococcus aureus infection 03/31/2013   HLD (hyperlipidemia) 01/02/2007   Allergic rhinitis 02/01/2006   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 [place] ([address]) [place] [address] Phone: [phone] Fax: [phone]  [facility] - [place], [place] - [address] [place] [address] Phone: [phone] Fax: [phone]     Social Determinants of Health (SDOH) Interventions    Readmission Risk Interventions     View : No data to display.

## 2022-04-20 DIAGNOSIS — R339 Retention of urine, unspecified: Secondary | ICD-10-CM | POA: Diagnosis not present

## 2022-04-20 DIAGNOSIS — K5900 Constipation, unspecified: Secondary | ICD-10-CM | POA: Diagnosis present

## 2022-04-20 DIAGNOSIS — R2689 Other abnormalities of gait and mobility: Secondary | ICD-10-CM | POA: Diagnosis not present

## 2022-04-20 DIAGNOSIS — L89159 Pressure ulcer of sacral region, unspecified stage: Secondary | ICD-10-CM | POA: Diagnosis not present

## 2022-04-20 DIAGNOSIS — M6281 Muscle weakness (generalized): Secondary | ICD-10-CM | POA: Diagnosis not present

## 2022-04-20 DIAGNOSIS — R300 Dysuria: Secondary | ICD-10-CM | POA: Diagnosis not present

## 2022-04-20 LAB — CBG MONITORING, ED: Glucose-Capillary: 119 mg/dL — ABNORMAL HIGH (ref 70–99)

## 2022-04-20 NOTE — Progress Notes (Signed)
SLP Cancellation Note  Patient Details Name: Connor [name] MRN: [id number] DOB: [birth year]   Cancelled treatment:       Reason Eval/Treat Not Completed: Other (comment)  Per chart, pt's family is requesting Hospice Home. If pt's family should request an aggressive medical approach, please re-consult at that time.   Adajah Cocking B. Dreama Saa, M.S., CCC-SLP, CBIS Speech-Language Pathologist Certified Brain Injury Specialist [facility]  [facility] [phone] Ascom [phone] Fax [phone]   Reuel Derby 04/20/2022, 7:39 AM

## 2022-04-20 NOTE — ED Notes (Signed)
Called [facility] for update of transport to the Hospice home. Pt is #1 on the list, waiting for a truck to be available. Francis at the Hospice home is asking for a Covid test be done.

## 2022-04-20 NOTE — Progress Notes (Addendum)
[facility] ED 03 [facility])   Consent forms to be completed by spouse/Debra.  Once complete, EMS will notified of patient D/C and transport arranged. TOC/Jeanna aware.   Please send signed DNR form with patient and RN call report to [phone].   Addendum: 1:53p Consents are complete and transport arranged for 7p    Odette Fraction, MSW [facility] Liaison [phone]

## 2022-04-20 NOTE — ED Notes (Signed)
Facility requested a rapid COVID tests to be performed prior to him leaving which is not available. Facility notified and they stated they would perform rapid tests upon his arrival.

## 2022-04-20 NOTE — ED Notes (Signed)
This RN placed pt on a new male purwick device.

## 2022-04-20 NOTE — TOC Transition Note (Signed)
Transition of Care [facility]) - CM/SW Discharge Note   Patient Details  Name: Connor [name] MRN: [id number] Date of Birth: 09-15-1961  Transition of Care [facility]) CM/SW Contact:  Shelbie Hutching, RN Phone Number: 04/20/2022, 9:58 AM   Clinical Narrative:    Patient has been approved for the [facility], they will have a bed today on 2nd shift.  Lorayne Bender with [facility] will keep me updated on when wife will sign consents and when she will arrange transport via EMS.   Final next level of care: [place] Barriers to Discharge: Barriers Resolved   Patient Goals and CMS Choice Patient states their goals for this hospitalization and ongoing recovery are:: Wife would like the hospice home CMS Medicare.gov Compare Post Acute Care list provided to:: Patient Represenative (must comment) Choice offered to / list presented to : Spouse  Discharge Placement              Patient chooses bed at: Other - please specify in the comment section below: [facility]) Patient to be transferred to facility by: [facility] Name of family member notified: Barbaraann Barthel Patient and family notified of of transfer: 04/20/22  Discharge Plan and Services   Discharge Planning Services: CM Consult Post Acute Care Choice: Hospice          DME Arranged: Hospital bed DME Agency: Other - Comment Lajean Manes) Date DME Agency Contacted: 04/19/22 Time DME Agency Contacted: [id number] Representative spoke with at DME Agency: [place] ([place]) Interventions     Readmission Risk Interventions     View : No data to display.

## 2022-04-20 NOTE — Telephone Encounter (Signed)
Called and spoke with Connor [name] from [employer], in regards to previous message about requesting  FL2 form and Dr. Sherrie Mustache recommendations. She states that she was trying to get information from provider, to grant a adult protection order. She states that she will fax a form requesting information needed to get protection order.

## 2022-04-20 NOTE — ED Notes (Signed)
Pt accepted to [facility] room 1509 [phone]

## 2022-06-19 DEATH — deceased
